# Patient Record
Sex: Male | Born: 1935 | Race: White | Hispanic: No | Marital: Married | Smoking: Former smoker
Health system: Southern US, Community
[De-identification: ages and names within clinical notes are randomized; demographics above are authoritative.]

## PROBLEM LIST (undated history)

## (undated) DIAGNOSIS — F32A Depression, unspecified: Secondary | ICD-10-CM

## (undated) DIAGNOSIS — M48061 Spinal stenosis, lumbar region without neurogenic claudication: Secondary | ICD-10-CM

## (undated) DIAGNOSIS — M1990 Unspecified osteoarthritis, unspecified site: Secondary | ICD-10-CM

## (undated) DIAGNOSIS — M109 Gout, unspecified: Secondary | ICD-10-CM

## (undated) DIAGNOSIS — R239 Unspecified skin changes: Secondary | ICD-10-CM

## (undated) DIAGNOSIS — R221 Localized swelling, mass and lump, neck: Secondary | ICD-10-CM

## (undated) DIAGNOSIS — E119 Type 2 diabetes mellitus without complications: Secondary | ICD-10-CM

## (undated) DIAGNOSIS — E039 Hypothyroidism, unspecified: Secondary | ICD-10-CM

## (undated) DIAGNOSIS — E785 Hyperlipidemia, unspecified: Secondary | ICD-10-CM

## (undated) DIAGNOSIS — K5792 Diverticulitis of intestine, part unspecified, without perforation or abscess without bleeding: Secondary | ICD-10-CM

## (undated) DIAGNOSIS — G4733 Obstructive sleep apnea (adult) (pediatric): Secondary | ICD-10-CM

## (undated) DIAGNOSIS — D631 Anemia in chronic kidney disease: Secondary | ICD-10-CM

## (undated) DIAGNOSIS — J189 Pneumonia, unspecified organism: Secondary | ICD-10-CM

## (undated) DIAGNOSIS — I82409 Acute embolism and thrombosis of unspecified deep veins of unspecified lower extremity: Secondary | ICD-10-CM

## (undated) DIAGNOSIS — I272 Pulmonary hypertension, unspecified: Secondary | ICD-10-CM

## (undated) DIAGNOSIS — I259 Chronic ischemic heart disease, unspecified: Secondary | ICD-10-CM

## (undated) DIAGNOSIS — I509 Heart failure, unspecified: Secondary | ICD-10-CM

## (undated) DIAGNOSIS — D696 Thrombocytopenia, unspecified: Secondary | ICD-10-CM

## (undated) DIAGNOSIS — D469 Myelodysplastic syndrome, unspecified: Principal | ICD-10-CM

## (undated) DIAGNOSIS — I442 Atrioventricular block, complete: Secondary | ICD-10-CM

## (undated) DIAGNOSIS — K602 Anal fissure, unspecified: Secondary | ICD-10-CM

## (undated) DIAGNOSIS — N039 Chronic nephritic syndrome with unspecified morphologic changes: Principal | ICD-10-CM

## (undated) DIAGNOSIS — I482 Chronic atrial fibrillation, unspecified: Secondary | ICD-10-CM

## (undated) DIAGNOSIS — F329 Major depressive disorder, single episode, unspecified: Secondary | ICD-10-CM

## (undated) DIAGNOSIS — Z86718 Personal history of other venous thrombosis and embolism: Secondary | ICD-10-CM

## (undated) DIAGNOSIS — K219 Gastro-esophageal reflux disease without esophagitis: Secondary | ICD-10-CM

## (undated) DIAGNOSIS — I219 Acute myocardial infarction, unspecified: Secondary | ICD-10-CM

## (undated) HISTORY — DX: Unspecified skin changes: R23.9

## (undated) HISTORY — PX: CATARACT EXTRACTION W/ INTRAOCULAR LENS  IMPLANT, BILATERAL: SHX1307

## (undated) HISTORY — DX: Type 2 diabetes mellitus without complications: E11.9

## (undated) HISTORY — DX: Major depressive disorder, single episode, unspecified: F32.9

## (undated) HISTORY — DX: Anal fissure, unspecified: K60.2

## (undated) HISTORY — PX: VASECTOMY: SHX75

## (undated) HISTORY — DX: Chronic ischemic heart disease, unspecified: I25.9

## (undated) HISTORY — DX: Personal history of other venous thrombosis and embolism: Z86.718

## (undated) HISTORY — DX: Depression, unspecified: F32.A

## (undated) HISTORY — DX: Chronic nephritic syndrome with unspecified morphologic changes: N03.9

## (undated) HISTORY — DX: Chronic atrial fibrillation, unspecified: I48.20

## (undated) HISTORY — DX: Thrombocytopenia, unspecified: D69.6

## (undated) HISTORY — DX: Hyperlipidemia, unspecified: E78.5

## (undated) HISTORY — PX: ACHILLES TENDON REPAIR: SUR1153

## (undated) HISTORY — DX: Unspecified osteoarthritis, unspecified site: M19.90

## (undated) HISTORY — PX: WRIST SURGERY: SHX841

## (undated) HISTORY — DX: Anemia in chronic kidney disease: D63.1

## (undated) HISTORY — PX: OTHER SURGICAL HISTORY: SHX169

## (undated) HISTORY — DX: Diverticulitis of intestine, part unspecified, without perforation or abscess without bleeding: K57.92

## (undated) HISTORY — DX: Myelodysplastic syndrome, unspecified: D46.9

## (undated) HISTORY — DX: Localized swelling, mass and lump, neck: R22.1

## (undated) HISTORY — DX: Pulmonary hypertension, unspecified: I27.20

---
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
## 1958-12-31 HISTORY — PX: THYROIDECTOMY: SHX17

## 1978-12-31 HISTORY — PX: INGUINAL HERNIA REPAIR: SUR1180

## 1989-08-31 DIAGNOSIS — Z86718 Personal history of other venous thrombosis and embolism: Secondary | ICD-10-CM

## 1989-08-31 HISTORY — DX: Personal history of other venous thrombosis and embolism: Z86.718

## 1997-12-31 DIAGNOSIS — I219 Acute myocardial infarction, unspecified: Secondary | ICD-10-CM

## 1997-12-31 HISTORY — DX: Acute myocardial infarction, unspecified: I21.9

## 1997-12-31 HISTORY — PX: CORONARY ANGIOPLASTY WITH STENT PLACEMENT: SHX49

## 1998-05-24 ENCOUNTER — Ambulatory Visit (HOSPITAL_COMMUNITY): Admission: RE | Admit: 1998-05-24 | Discharge: 1998-05-24 | Payer: Self-pay | Admitting: Cardiology

## 1998-05-25 ENCOUNTER — Ambulatory Visit (HOSPITAL_COMMUNITY): Admission: RE | Admit: 1998-05-25 | Discharge: 1998-05-25 | Payer: Self-pay | Admitting: Cardiology

## 1998-10-17 ENCOUNTER — Emergency Department (HOSPITAL_COMMUNITY): Admission: EM | Admit: 1998-10-17 | Discharge: 1998-10-17 | Payer: Self-pay | Admitting: Emergency Medicine

## 2001-04-20 ENCOUNTER — Encounter: Payer: Self-pay | Admitting: Cardiology

## 2001-04-20 ENCOUNTER — Ambulatory Visit (HOSPITAL_COMMUNITY): Admission: RE | Admit: 2001-04-20 | Discharge: 2001-04-20 | Payer: Self-pay | Admitting: Cardiology

## 2001-04-25 ENCOUNTER — Ambulatory Visit (HOSPITAL_COMMUNITY): Admission: RE | Admit: 2001-04-25 | Discharge: 2001-04-25 | Payer: Self-pay | Admitting: Cardiology

## 2001-10-29 ENCOUNTER — Encounter: Payer: Self-pay | Admitting: Emergency Medicine

## 2001-10-29 ENCOUNTER — Emergency Department (HOSPITAL_COMMUNITY): Admission: EM | Admit: 2001-10-29 | Discharge: 2001-10-29 | Payer: Self-pay | Admitting: Emergency Medicine

## 2003-04-13 ENCOUNTER — Encounter: Admission: RE | Admit: 2003-04-13 | Discharge: 2003-04-13 | Payer: Self-pay | Admitting: Cardiology

## 2003-04-13 ENCOUNTER — Encounter: Payer: Self-pay | Admitting: Cardiology

## 2005-04-04 ENCOUNTER — Emergency Department (HOSPITAL_COMMUNITY): Admission: EM | Admit: 2005-04-04 | Discharge: 2005-04-04 | Payer: Self-pay | Admitting: Family Medicine

## 2006-05-30 ENCOUNTER — Encounter: Admission: RE | Admit: 2006-05-30 | Discharge: 2006-05-30 | Payer: Self-pay | Admitting: Orthopaedic Surgery

## 2006-06-05 ENCOUNTER — Ambulatory Visit: Payer: Self-pay | Admitting: Hematology and Oncology

## 2006-06-19 LAB — CBC & DIFF AND RETIC
Eosinophils Absolute: 0.1 10*3/uL (ref 0.0–0.5)
HGB: 10.4 g/dL — ABNORMAL LOW (ref 13.0–17.1)
IRF: 0.35 (ref 0.070–0.380)
MONO#: 0.8 10*3/uL (ref 0.1–0.9)
MONO%: 12 % (ref 0.0–13.0)
NEUT#: 5.2 10*3/uL (ref 1.5–6.5)
RBC: 2.94 10*6/uL — ABNORMAL LOW (ref 4.20–5.71)
RDW: 14.9 % — ABNORMAL HIGH (ref 11.2–14.6)
RETIC #: 45 10*3/uL (ref 31.8–103.9)
Retic %: 1.5 % (ref 0.7–2.3)
WBC: 6.6 10*3/uL (ref 4.0–10.0)
lymph#: 0.6 10*3/uL — ABNORMAL LOW (ref 0.9–3.3)

## 2006-06-19 LAB — TSH: TSH: 0.26 u[IU]/mL — ABNORMAL LOW (ref 0.350–5.500)

## 2006-06-19 LAB — CHCC SMEAR

## 2006-06-20 LAB — COMPREHENSIVE METABOLIC PANEL
BUN: 27 mg/dL — ABNORMAL HIGH (ref 6–23)
CO2: 23 mEq/L (ref 19–32)
Glucose, Bld: 121 mg/dL — ABNORMAL HIGH (ref 70–99)
Sodium: 133 mEq/L — ABNORMAL LOW (ref 135–145)
Total Bilirubin: 0.8 mg/dL (ref 0.3–1.2)
Total Protein: 6 g/dL (ref 6.0–8.3)

## 2006-06-20 LAB — FOLATE: Folate: >20 ng/mL

## 2006-06-20 LAB — DIRECT ANTIGLOBULIN TEST (NOT AT ARMC): DAT (Complement): NEGATIVE

## 2006-06-20 LAB — VITAMIN B12: Vitamin B-12: 376 pg/mL (ref 211–911)

## 2006-06-20 LAB — PROTEIN ELECTROPHORESIS, SERUM
Gamma Globulin: 10.1 % — ABNORMAL LOW (ref 11.1–18.8)
Total Protein, Serum Electrophoresis: 6 g/dL (ref 6.0–8.3)

## 2006-06-20 LAB — IRON AND TIBC: Iron: 95 ug/dL (ref 42–165)

## 2006-06-20 LAB — FERRITIN: Ferritin: 273 ng/mL (ref 22–322)

## 2006-06-26 LAB — UIFE/LIGHT CHAINS/TP QN, 24-HR UR
Free Kappa Lt Chains,Ur: 0.2 mg/dL (ref 0.04–1.51)
Free Kappa/Lambda Ratio: 2 ratio (ref 0.46–4.00)
Free Lambda Excretion/Day: 1.82 mg/d
Time: 24 hours
Total Protein, Urine: 0.5 mg/dL

## 2006-06-28 ENCOUNTER — Other Ambulatory Visit: Admission: RE | Admit: 2006-06-28 | Discharge: 2006-06-28 | Payer: Self-pay | Admitting: Hematology and Oncology

## 2006-06-28 ENCOUNTER — Encounter (INDEPENDENT_AMBULATORY_CARE_PROVIDER_SITE_OTHER): Payer: Self-pay | Admitting: Specialist

## 2006-07-12 LAB — CBC WITH DIFFERENTIAL/PLATELET
Basophils Absolute: 0 10*3/uL (ref 0.0–0.1)
Eosinophils Absolute: 0.1 10*3/uL (ref 0.0–0.5)
HGB: 10.6 g/dL — ABNORMAL LOW (ref 13.0–17.1)
LYMPH%: 12.8 % — ABNORMAL LOW (ref 14.0–48.0)
MCV: 104.5 fL — ABNORMAL HIGH (ref 81.6–98.0)
MONO#: 0.7 10*3/uL (ref 0.1–0.9)
NEUT#: 4.1 10*3/uL (ref 1.5–6.5)
Platelets: 207 10*3/uL (ref 145–400)
RBC: 2.95 10*6/uL — ABNORMAL LOW (ref 4.20–5.71)
RDW: 14.9 % — ABNORMAL HIGH (ref 11.2–14.6)
WBC: 5.7 10*3/uL (ref 4.0–10.0)

## 2006-07-12 LAB — COMPREHENSIVE METABOLIC PANEL
Albumin: 4.2 g/dL (ref 3.5–5.2)
BUN: 18 mg/dL (ref 6–23)
CO2: 28 mEq/L (ref 19–32)
Glucose, Bld: 82 mg/dL (ref 70–99)
Potassium: 4 mEq/L (ref 3.5–5.3)
Sodium: 137 mEq/L (ref 135–145)
Total Protein: 5.9 g/dL — ABNORMAL LOW (ref 6.0–8.3)

## 2006-07-17 ENCOUNTER — Encounter: Admission: RE | Admit: 2006-07-17 | Discharge: 2006-07-17 | Payer: Self-pay | Admitting: Orthopaedic Surgery

## 2006-08-16 ENCOUNTER — Encounter: Admission: RE | Admit: 2006-08-16 | Discharge: 2006-08-16 | Payer: Self-pay | Admitting: Orthopaedic Surgery

## 2007-03-31 ENCOUNTER — Encounter: Admission: RE | Admit: 2007-03-31 | Discharge: 2007-03-31 | Payer: Self-pay | Admitting: Otolaryngology

## 2008-01-01 HISTORY — PX: COLONOSCOPY: SHX174

## 2008-10-22 ENCOUNTER — Emergency Department (HOSPITAL_COMMUNITY): Admission: EM | Admit: 2008-10-22 | Discharge: 2008-10-22 | Payer: Self-pay | Admitting: Family Medicine

## 2008-12-06 ENCOUNTER — Ambulatory Visit: Payer: Self-pay | Admitting: Vascular Surgery

## 2010-08-08 ENCOUNTER — Ambulatory Visit: Payer: Self-pay | Admitting: *Deleted

## 2010-09-06 ENCOUNTER — Ambulatory Visit: Payer: Self-pay | Admitting: Cardiology

## 2010-10-04 ENCOUNTER — Ambulatory Visit: Payer: Self-pay | Admitting: Cardiology

## 2010-11-01 ENCOUNTER — Ambulatory Visit: Payer: Self-pay | Admitting: Cardiology

## 2010-11-25 ENCOUNTER — Emergency Department (HOSPITAL_COMMUNITY): Admission: EM | Admit: 2010-11-25 | Discharge: 2010-11-25 | Payer: Self-pay | Admitting: Emergency Medicine

## 2010-11-29 ENCOUNTER — Ambulatory Visit: Payer: Self-pay | Admitting: Cardiology

## 2010-12-01 ENCOUNTER — Ambulatory Visit: Payer: Self-pay | Admitting: Cardiology

## 2010-12-05 ENCOUNTER — Encounter: Payer: Self-pay | Admitting: Cardiology

## 2010-12-05 ENCOUNTER — Ambulatory Visit (HOSPITAL_COMMUNITY)
Admission: RE | Admit: 2010-12-05 | Discharge: 2010-12-05 | Payer: Self-pay | Source: Home / Self Care | Admitting: Cardiology

## 2010-12-05 ENCOUNTER — Ambulatory Visit: Payer: Self-pay

## 2010-12-06 ENCOUNTER — Ambulatory Visit: Payer: Self-pay | Admitting: Cardiovascular Disease

## 2010-12-28 ENCOUNTER — Ambulatory Visit: Payer: Self-pay | Admitting: Cardiology

## 2011-01-21 ENCOUNTER — Encounter: Payer: Self-pay | Admitting: Otolaryngology

## 2011-01-21 ENCOUNTER — Encounter: Payer: Self-pay | Admitting: Orthopaedic Surgery

## 2011-01-22 ENCOUNTER — Ambulatory Visit: Payer: Self-pay | Admitting: Cardiology

## 2011-02-16 ENCOUNTER — Other Ambulatory Visit (INDEPENDENT_AMBULATORY_CARE_PROVIDER_SITE_OTHER): Payer: Medicare Other

## 2011-02-16 DIAGNOSIS — I4891 Unspecified atrial fibrillation: Secondary | ICD-10-CM

## 2011-02-16 DIAGNOSIS — Z7901 Long term (current) use of anticoagulants: Secondary | ICD-10-CM

## 2011-03-06 ENCOUNTER — Encounter (INDEPENDENT_AMBULATORY_CARE_PROVIDER_SITE_OTHER): Payer: Medicare Other

## 2011-03-06 DIAGNOSIS — Z7901 Long term (current) use of anticoagulants: Secondary | ICD-10-CM

## 2011-03-06 DIAGNOSIS — I4891 Unspecified atrial fibrillation: Secondary | ICD-10-CM

## 2011-03-22 ENCOUNTER — Other Ambulatory Visit: Payer: Self-pay | Admitting: Dermatology

## 2011-03-29 ENCOUNTER — Telehealth: Payer: Self-pay | Admitting: Cardiology

## 2011-03-29 NOTE — Telephone Encounter (Signed)
RN scheduled f/u INR with pt's wife for 04/03/11.

## 2011-03-29 NOTE — Telephone Encounter (Signed)
Does patient need an appointment for INR check sooner than 04/17/2011.  They did not get results.

## 2011-04-03 ENCOUNTER — Other Ambulatory Visit: Payer: Medicare Other | Admitting: *Deleted

## 2011-04-03 ENCOUNTER — Ambulatory Visit (INDEPENDENT_AMBULATORY_CARE_PROVIDER_SITE_OTHER): Payer: Medicare Other | Admitting: *Deleted

## 2011-04-03 DIAGNOSIS — I4891 Unspecified atrial fibrillation: Secondary | ICD-10-CM

## 2011-04-03 DIAGNOSIS — Z7901 Long term (current) use of anticoagulants: Secondary | ICD-10-CM

## 2011-04-03 LAB — POCT INR: INR: 1.8

## 2011-04-16 ENCOUNTER — Encounter: Payer: Self-pay | Admitting: Cardiology

## 2011-04-16 DIAGNOSIS — E785 Hyperlipidemia, unspecified: Secondary | ICD-10-CM | POA: Insufficient documentation

## 2011-04-16 DIAGNOSIS — E119 Type 2 diabetes mellitus without complications: Secondary | ICD-10-CM | POA: Insufficient documentation

## 2011-04-16 DIAGNOSIS — I482 Chronic atrial fibrillation, unspecified: Secondary | ICD-10-CM | POA: Insufficient documentation

## 2011-04-16 DIAGNOSIS — I272 Pulmonary hypertension, unspecified: Secondary | ICD-10-CM | POA: Insufficient documentation

## 2011-04-16 DIAGNOSIS — I517 Cardiomegaly: Secondary | ICD-10-CM | POA: Insufficient documentation

## 2011-04-16 DIAGNOSIS — R262 Difficulty in walking, not elsewhere classified: Secondary | ICD-10-CM | POA: Insufficient documentation

## 2011-04-16 DIAGNOSIS — I499 Cardiac arrhythmia, unspecified: Secondary | ICD-10-CM | POA: Insufficient documentation

## 2011-04-16 DIAGNOSIS — H9190 Unspecified hearing loss, unspecified ear: Secondary | ICD-10-CM | POA: Insufficient documentation

## 2011-04-16 DIAGNOSIS — R0602 Shortness of breath: Secondary | ICD-10-CM | POA: Insufficient documentation

## 2011-04-16 DIAGNOSIS — R609 Edema, unspecified: Secondary | ICD-10-CM | POA: Insufficient documentation

## 2011-04-16 DIAGNOSIS — I259 Chronic ischemic heart disease, unspecified: Secondary | ICD-10-CM | POA: Insufficient documentation

## 2011-04-16 DIAGNOSIS — R239 Unspecified skin changes: Secondary | ICD-10-CM | POA: Insufficient documentation

## 2011-04-16 DIAGNOSIS — F329 Major depressive disorder, single episode, unspecified: Secondary | ICD-10-CM | POA: Insufficient documentation

## 2011-04-16 DIAGNOSIS — F1010 Alcohol abuse, uncomplicated: Secondary | ICD-10-CM | POA: Insufficient documentation

## 2011-04-17 ENCOUNTER — Encounter: Payer: Self-pay | Admitting: Cardiology

## 2011-04-17 ENCOUNTER — Ambulatory Visit (INDEPENDENT_AMBULATORY_CARE_PROVIDER_SITE_OTHER): Payer: Medicare Other | Admitting: Cardiology

## 2011-04-17 ENCOUNTER — Ambulatory Visit (INDEPENDENT_AMBULATORY_CARE_PROVIDER_SITE_OTHER): Payer: Medicare Other | Admitting: *Deleted

## 2011-04-17 DIAGNOSIS — I272 Pulmonary hypertension, unspecified: Secondary | ICD-10-CM

## 2011-04-17 DIAGNOSIS — I2789 Other specified pulmonary heart diseases: Secondary | ICD-10-CM

## 2011-04-17 DIAGNOSIS — I4891 Unspecified atrial fibrillation: Secondary | ICD-10-CM

## 2011-04-17 DIAGNOSIS — E785 Hyperlipidemia, unspecified: Secondary | ICD-10-CM

## 2011-04-17 NOTE — Assessment & Plan Note (Signed)
He stopped Crestor because of myalgias. I've encouraged him to start back on Crestor. Myalgias went away as he lost fluid. If myalgias return with Crestor, we'll need to discontinue that permanently.

## 2011-04-17 NOTE — Assessment & Plan Note (Signed)
Mr. Paglia is able to manage his lower extremity edema with combination of support stockings and p.r.n. Diuretics. I encouraged him to weigh on a daily basis. His chronic dyspnea on exertion is unchanged. If he holds onto too much fluid, he begins to get a cough.

## 2011-04-17 NOTE — Assessment & Plan Note (Signed)
He remains in atrial fibrillation with rate control. His INR today is 2.1. Check an INR in 4 weeks and have him see Dr. Antoine Poche in 4 months.

## 2011-04-17 NOTE — Progress Notes (Signed)
Subjective:   Douglas George seen today for followup visit. He has chronic atrial fibrillation is managed on chronic Coumadin. He's had atrial fibrillation since 1989. He's having more lower extremity edema with a consideration of pulmonary hypertension with PAS of up to 74 mmHg by echocardiogram in December of 2011. He's been managed on diuretics and with increasing shortness of breath increasing lower extremity edema, weight has been able to be controlled by increasing the diuretic.  He's had problems with hyperlipemia with intolerance to statin drugs but currently is off Crestor but is willing to restart. He's had moderate coronary atherosclerosis in the past with a last catheterization in 1998 which led to a stent in the right coronary artery. At that time, his only residual disease was a 60-70% narrowing in the LAD. He's had normal left ventricular ejection fraction.  Current Outpatient Prescriptions  Medication Sig Dispense Refill  . ACCU-CHEK AVIVA PLUS test strip Use as directed       . Ascorbic Acid (VITAMIN C) 1000 MG tablet Take 1,000 mg by mouth daily.        . Calcium Carb-Cholecalciferol (CALCIUM 1000 + D) 1000-800 MG-UNIT TABS Take by mouth daily.        . Cholecalciferol (VITAMIN D) 1000 UNITS capsule Take 1,000 Units by mouth daily.        Marland Kitchen doxazosin (CARDURA) 4 MG tablet Take 8 mg by mouth at bedtime.        . furosemide (LASIX) 40 MG tablet Take 80 mg by mouth daily.       Marland Kitchen glipiZIDE (GLUCOTROL) 2.5 MG 24 hr tablet Take 2.5 mg by mouth daily.        . IRON PO Take 65 mg by mouth daily.        Marland Kitchen levothyroxine (SYNTHROID, LEVOTHROID) 150 MCG tablet Take 150 mcg by mouth daily.        . metoprolol tartrate (LOPRESSOR) 25 MG tablet Take 12.5 mg by mouth 2 (two) times daily.        Marland Kitchen oxybutynin (DITROPAN) 5 MG tablet Take 5 mg by mouth as needed.        . ramipril (ALTACE) 10 MG capsule Take 10 mg by mouth daily.        . vitamin B-12 (CYANOCOBALAMIN) 500 MCG tablet Take 500 mcg by mouth  daily.        Marland Kitchen warfarin (COUMADIN) 5 MG tablet Take 5 mg by mouth daily. AS DIRECTED       . Metolazone (ZAROXOLYN PO) Take by mouth as needed.        . rosuvastatin (CRESTOR) 5 MG tablet Take 5 mg by mouth daily.          Allergies  Allergen Reactions  . Codeine     Patient Active Problem List  Diagnoses  . Atrial fibrillation  . Edema  . Irregular heart beat  . Hearing loss  . SOB (shortness of breath)  . Difficulty walking  . Skin change  . Chronic atrial fibrillation  . Alcohol abuse  . Diabetes mellitus  . Hyperlipidemia  . Depression  . Ischemic heart disease  . Enlarged RV (right ventricle)  . Pulmonary hypertension    History  Smoking status  . Former Smoker  . Quit date: 12/31/1952  Smokeless tobacco  . Never Used    History  Alcohol Use No    Family History  Problem Relation Age of Onset  . Heart failure Mother 69  . Diabetes Mother 40  .  Heart attack Father 51  . Stroke Father 65  . Aortic aneurysm Father 61    ABDOMINAL    Review of Systems:   The patient denies any heat or cold intolerance.  No weight gain or weight loss.  The patient denies headaches or blurry vision.  There is no cough or sputum production.  The patient denies dizziness.  There is no hematuria or hematochezia.  The patient denies any muscle aches or arthritis.  The patient denies any rash.  The patient denies frequent falling or instability.  There is no history of depression or anxiety.  All other systems were reviewed and are negative.   Physical Exam:   Weights 168. Blood pressure is 142/64. Heart rate 62 he is fragile.Marland Kitchen His a skin biopsy site in the middle of his for head. He has multiple ecchymoses on his skin. He is fragile with a slight tremor.The head is normocephalic and atraumatic.  Pupils are equally round and reactive to light.  Sclerae nonicteric.  Conjunctiva is clear.  Oropharynx is unremarkable.  There's adequate oral airway.  Neck is supple there are no masses.   Thyroid is not enlarged.  There is no lymphadenopathy.  Lungs are clear.  Chest is symmetric.  Heart shows a irregular rate and rhythm.  S1 and S2 are normal.  There is an apical murmur  Abdomen is soft normal bowel sounds.  There is no organomegaly.  Genital and rectal deferred.  Extremities are without edema.support stockings are applied  Peripheral pulses are adequate.  Neurologically intact.  Full range of motion.  The patient is not depressed.  Skin is warm and dry. Assessment / Plan:

## 2011-05-09 ENCOUNTER — Other Ambulatory Visit: Payer: Self-pay | Admitting: Dermatology

## 2011-05-14 ENCOUNTER — Encounter: Payer: Medicare Other | Admitting: *Deleted

## 2011-05-15 ENCOUNTER — Ambulatory Visit (INDEPENDENT_AMBULATORY_CARE_PROVIDER_SITE_OTHER): Payer: Medicare Other | Admitting: *Deleted

## 2011-05-15 DIAGNOSIS — I4891 Unspecified atrial fibrillation: Secondary | ICD-10-CM

## 2011-05-15 NOTE — Procedures (Signed)
DUPLEX DEEP VENOUS EXAM - LOWER EXTREMITY   INDICATION:  Left lower extremity pain and swelling.   HISTORY:  Edema:  Yes.  Trauma/Surgery:  No.  Pain:  Yes.  PE:  No.  Previous DVT:  No.  Anticoagulants:  Warfarin.  Other:   DUPLEX EXAM:                CFV   SFV   PopV  PTV    GSV                R  L  R  L  R  L  R   L  R  L  Thrombosis    o  o     o     o      o     o  Spontaneous   +  +     +     +      +     +  Phasic        +  +     +     +      +     +  Augmentation  +  +     +     +      +     +  Compressible  +  +     +     +      +     +  Competent     +  +     +     +      +     +   Legend:  + - yes  o - no  p - partial  D - decreased   IMPRESSION:  No evidence of deep venous thrombosis noted in the left  leg.   Notified Kelly with results.    _____________________________  Janetta Hora Fields, MD   MG/MEDQ  D:  12/06/2008  T:  12/07/2008  Job:  [id number]

## 2011-05-17 ENCOUNTER — Encounter: Payer: Medicare Other | Admitting: *Deleted

## 2011-05-18 ENCOUNTER — Encounter: Payer: Medicare Other | Admitting: *Deleted

## 2011-05-25 ENCOUNTER — Other Ambulatory Visit: Payer: Self-pay | Admitting: Cardiology

## 2011-05-25 MED ORDER — METOPROLOL TARTRATE 25 MG PO TABS: ORAL_TABLET | ORAL | Status: DC

## 2011-05-25 NOTE — Telephone Encounter (Signed)
Refill of Lopressor done

## 2011-05-25 NOTE — Telephone Encounter (Signed)
Is out of metrpolo 25mg  called into [facility] at [address]  Pharmacy stated they faxed request over to [place].

## 2011-06-12 ENCOUNTER — Ambulatory Visit (INDEPENDENT_AMBULATORY_CARE_PROVIDER_SITE_OTHER): Payer: Medicare Other | Admitting: *Deleted

## 2011-06-12 DIAGNOSIS — I4891 Unspecified atrial fibrillation: Secondary | ICD-10-CM

## 2011-07-10 ENCOUNTER — Encounter: Payer: Medicare Other | Admitting: *Deleted

## 2011-07-10 ENCOUNTER — Ambulatory Visit (INDEPENDENT_AMBULATORY_CARE_PROVIDER_SITE_OTHER): Payer: Medicare Other | Admitting: *Deleted

## 2011-07-10 DIAGNOSIS — I4891 Unspecified atrial fibrillation: Secondary | ICD-10-CM

## 2011-07-10 LAB — POCT INR: INR: 2.8

## 2011-08-07 ENCOUNTER — Ambulatory Visit (INDEPENDENT_AMBULATORY_CARE_PROVIDER_SITE_OTHER): Payer: Medicare Other | Admitting: *Deleted

## 2011-08-07 DIAGNOSIS — I4891 Unspecified atrial fibrillation: Secondary | ICD-10-CM

## 2011-09-04 ENCOUNTER — Encounter: Payer: Medicare Other | Admitting: *Deleted

## 2011-09-13 ENCOUNTER — Ambulatory Visit (INDEPENDENT_AMBULATORY_CARE_PROVIDER_SITE_OTHER): Payer: Medicare Other | Admitting: Cardiology

## 2011-09-13 ENCOUNTER — Encounter: Payer: Self-pay | Admitting: Cardiology

## 2011-09-13 ENCOUNTER — Ambulatory Visit (INDEPENDENT_AMBULATORY_CARE_PROVIDER_SITE_OTHER): Payer: Medicare Other | Admitting: *Deleted

## 2011-09-13 DIAGNOSIS — I2789 Other specified pulmonary heart diseases: Secondary | ICD-10-CM

## 2011-09-13 DIAGNOSIS — I259 Chronic ischemic heart disease, unspecified: Secondary | ICD-10-CM

## 2011-09-13 DIAGNOSIS — I4891 Unspecified atrial fibrillation: Secondary | ICD-10-CM

## 2011-09-13 DIAGNOSIS — I272 Pulmonary hypertension, unspecified: Secondary | ICD-10-CM

## 2011-09-13 DIAGNOSIS — I517 Cardiomegaly: Secondary | ICD-10-CM

## 2011-09-13 DIAGNOSIS — R609 Edema, unspecified: Secondary | ICD-10-CM

## 2011-09-13 DIAGNOSIS — I482 Chronic atrial fibrillation, unspecified: Secondary | ICD-10-CM

## 2011-09-13 DIAGNOSIS — R0989 Other specified symptoms and signs involving the circulatory and respiratory systems: Secondary | ICD-10-CM

## 2011-09-13 NOTE — Assessment & Plan Note (Signed)
I will check a carotid doppler

## 2011-09-13 NOTE — Assessment & Plan Note (Addendum)
I reviewed extensively his large file.  He tolerates this rhythm and rate control and anticoagulation. We will continue with the meds as listed.  (Greater than 40 minutes reviewing all data with greater than 50% face to face with the patient).

## 2011-09-13 NOTE — Progress Notes (Signed)
HPI The patient presents for follow of atrial fib and CAD.  He was previously seen by Dr. Deborah Chalk.  Since last being seen he has had no new problems.  Diagnoses atrial fibrillation.  He denies any palpitations, presyncope or syncope. He does not have chest pressure, neck or arm discomfort.  He is limited by disc disease.  He gets around with a walker.  He does not report shortness of breath, PND or orthopnea. He said no weight gain. He has chronic lower extremity swelling which he has treated for years with compression stockings.  Allergies  Allergen Reactions  . Codeine     Current Outpatient Prescriptions  Medication Sig Dispense Refill  . ACCU-CHEK AVIVA PLUS test strip Use as directed       . Ascorbic Acid (VITAMIN C) 1000 MG tablet Take 1,000 mg by mouth daily.        . Calcium Carb-Cholecalciferol (CALCIUM 1000 + D) 1000-800 MG-UNIT TABS Take by mouth daily.        . Cholecalciferol (VITAMIN D) 1000 UNITS capsule Take 1,000 Units by mouth daily.        Marland Kitchen doxazosin (CARDURA) 4 MG tablet Take 8 mg by mouth at bedtime.        . furosemide (LASIX) 40 MG tablet Take 80 mg by mouth daily.       Marland Kitchen glipiZIDE (GLUCOTROL) 2.5 MG 24 hr tablet Take 2.5 mg by mouth daily.        . IRON PO Take 65 mg by mouth daily.        Marland Kitchen levothyroxine (SYNTHROID, LEVOTHROID) 150 MCG tablet Take 150 mcg by mouth daily.        . metoprolol tartrate (LOPRESSOR) 25 MG tablet Take 1/2 tablet BID  30 tablet  5  . omeprazole (PRILOSEC) 20 MG capsule Take 20 mg by mouth daily.        Marland Kitchen oxybutynin (DITROPAN) 5 MG tablet Take 5 mg by mouth as needed.        . ramipril (ALTACE) 10 MG capsule Take 10 mg by mouth daily.        . rosuvastatin (CRESTOR) 5 MG tablet Take 5 mg by mouth daily.        . vitamin B-12 (CYANOCOBALAMIN) 500 MCG tablet Take 500 mcg by mouth daily.        Marland Kitchen warfarin (COUMADIN) 5 MG tablet Take 5 mg by mouth daily. AS DIRECTED         Past Medical History  Diagnosis Date  . Edema   . Irregular  heart beat   . Hearing loss   . SOB (shortness of breath)     WITH WALKING  . Difficulty walking   . Skin change   . Chronic atrial fibrillation   . Alcohol abuse     PAST HISTORY  . Diabetes mellitus   . Hyperlipidemia   . Depression   . Ischemic heart disease   . Enlarged RV (right ventricle)   . Pulmonary hypertension     Past Surgical History  Procedure Date  . Thyroidectomy   . Left wrist surgery   . Achilles tendon repair     Left  . Shrapnel     [place]   . Tonsillectomy and adenoidectomy   . Inguinal hernia repair   . Cataract extraction     ROS: PHYSICAL EXAM BP 143/66  Pulse 62  Resp 18  Ht 5\' 6"  (1.676 m)  Wt 165 lb (74.844 kg)  BMI  26.63 kg/m2 GENERAL:  Well appearing HEENT:  Pupils equal round and reactive, fundi not visualized, oral mucosa unremarkable, upper dentures NECK:  No jugular venous distention, waveform within normal limits, carotid upstroke brisk and symmetric, left bruit, no thyromegaly LYMPHATICS:  No cervical, inguinal adenopathy LUNGS:  Clear to auscultation bilaterally BACK:  No CVA tenderness CHEST:  Unremarkable HEART:  PMI not displaced or sustained,S1 and S2 within normal limits, no S3, no clicks, no rubs, holosystolic murmur, irregular ABD:  Flat, positive bowel sounds normal in frequency in pitch, no bruits, no rebound, no guarding, no midline pulsatile mass, no hepatomegaly, no splenomegaly EXT:  2 plus pulses throughout, mild edema, no cyanosis no clubbing SKIN:  No rashes no nodules NEURO:  Cranial nerves II through XII grossly intact, motor grossly intact throughout PSYCH:  Cognitively intact, oriented to person place and time  EKG:  Atrial fibrillation rate 60 axis and intervals within normal limits, no acute ST-T wave changes.  ASSESSMENT AND PLAN

## 2011-09-13 NOTE — Patient Instructions (Signed)
Your physician has requested that you have a carotid duplex. This test is an ultrasound of the carotid arteries in your neck. It looks at blood flow through these arteries that supply the brain with blood. Allow one hour for this exam. There are no restrictions or special instructions.  The current medical regimen is effective;  continue present plan and medications.  Follow up in 6 months with Dr Hochrein.  You will receive a letter in the mail 2 months before you are due.  Please call us when you receive this letter to schedule your follow up appointment.  

## 2011-09-13 NOTE — Assessment & Plan Note (Signed)
He did have an echo in 2011 with pulmonary hypertension, moderate aortic regurgitation and mitral regurgitation. However, this was not significantly changed. He will continue to be managed medically.

## 2011-09-13 NOTE — Assessment & Plan Note (Signed)
We discussed conservative therapy.  No change in medications is indicated.

## 2011-09-13 NOTE — Assessment & Plan Note (Signed)
He has no active angina.   He had a stress perfusion study in 2011 the without significant ischemia. No further evaluation is indicated. He will continue with risk reduction.

## 2011-09-20 ENCOUNTER — Other Ambulatory Visit: Payer: Self-pay | Admitting: Dermatology

## 2011-10-08 ENCOUNTER — Encounter (INDEPENDENT_AMBULATORY_CARE_PROVIDER_SITE_OTHER): Payer: Medicare Other | Admitting: *Deleted

## 2011-10-08 DIAGNOSIS — R0989 Other specified symptoms and signs involving the circulatory and respiratory systems: Secondary | ICD-10-CM

## 2011-10-08 DIAGNOSIS — I6529 Occlusion and stenosis of unspecified carotid artery: Secondary | ICD-10-CM

## 2011-10-11 ENCOUNTER — Ambulatory Visit (INDEPENDENT_AMBULATORY_CARE_PROVIDER_SITE_OTHER): Payer: Medicare Other | Admitting: *Deleted

## 2011-10-11 DIAGNOSIS — I4891 Unspecified atrial fibrillation: Secondary | ICD-10-CM

## 2011-10-11 LAB — POCT INR: INR: 2.9

## 2011-11-08 ENCOUNTER — Ambulatory Visit (INDEPENDENT_AMBULATORY_CARE_PROVIDER_SITE_OTHER): Payer: Medicare Other | Admitting: *Deleted

## 2011-11-08 DIAGNOSIS — I4891 Unspecified atrial fibrillation: Secondary | ICD-10-CM

## 2011-11-08 LAB — POCT INR: INR: 3.2

## 2011-11-19 ENCOUNTER — Other Ambulatory Visit: Payer: Self-pay | Admitting: Dermatology

## 2011-11-27 ENCOUNTER — Other Ambulatory Visit: Payer: Self-pay | Admitting: *Deleted

## 2011-11-27 MED ORDER — WARFARIN SODIUM 5 MG PO TABS: 5.0000 mg | ORAL_TABLET | ORAL | Status: DC

## 2011-11-29 ENCOUNTER — Ambulatory Visit (INDEPENDENT_AMBULATORY_CARE_PROVIDER_SITE_OTHER): Payer: Medicare Other | Admitting: *Deleted

## 2011-11-29 DIAGNOSIS — I4891 Unspecified atrial fibrillation: Secondary | ICD-10-CM

## 2011-12-13 ENCOUNTER — Ambulatory Visit (INDEPENDENT_AMBULATORY_CARE_PROVIDER_SITE_OTHER): Payer: Medicare Other | Admitting: *Deleted

## 2011-12-13 DIAGNOSIS — I4891 Unspecified atrial fibrillation: Secondary | ICD-10-CM

## 2011-12-13 LAB — POCT INR: INR: 2.6

## 2012-01-03 ENCOUNTER — Ambulatory Visit (INDEPENDENT_AMBULATORY_CARE_PROVIDER_SITE_OTHER): Payer: Medicare Other | Admitting: *Deleted

## 2012-01-03 DIAGNOSIS — I4891 Unspecified atrial fibrillation: Secondary | ICD-10-CM

## 2012-01-03 LAB — POCT INR: INR: 2.1

## 2012-01-31 ENCOUNTER — Ambulatory Visit (INDEPENDENT_AMBULATORY_CARE_PROVIDER_SITE_OTHER): Payer: Medicare Other | Admitting: Pharmacist

## 2012-01-31 DIAGNOSIS — I4891 Unspecified atrial fibrillation: Secondary | ICD-10-CM

## 2012-02-27 ENCOUNTER — Ambulatory Visit (INDEPENDENT_AMBULATORY_CARE_PROVIDER_SITE_OTHER): Payer: Medicare Other | Admitting: Pharmacist

## 2012-02-27 ENCOUNTER — Other Ambulatory Visit: Payer: Self-pay | Admitting: Dermatology

## 2012-02-27 DIAGNOSIS — I4891 Unspecified atrial fibrillation: Secondary | ICD-10-CM

## 2012-02-27 NOTE — Patient Instructions (Signed)
Counseled patient to let us know if any medications change or if new ones are added, or if patient experiences bleeding or bruising.

## 2012-03-17 ENCOUNTER — Encounter: Payer: Self-pay | Admitting: Cardiology

## 2012-03-17 ENCOUNTER — Ambulatory Visit (INDEPENDENT_AMBULATORY_CARE_PROVIDER_SITE_OTHER): Payer: Medicare Other | Admitting: Cardiology

## 2012-03-17 VITALS — BP 126/64 | HR 45 | Ht 66.0 in | Wt 161.0 lb

## 2012-03-17 DIAGNOSIS — I4891 Unspecified atrial fibrillation: Secondary | ICD-10-CM

## 2012-03-17 DIAGNOSIS — R609 Edema, unspecified: Secondary | ICD-10-CM

## 2012-03-17 DIAGNOSIS — I517 Cardiomegaly: Secondary | ICD-10-CM

## 2012-03-17 DIAGNOSIS — I259 Chronic ischemic heart disease, unspecified: Secondary | ICD-10-CM

## 2012-03-17 NOTE — Assessment & Plan Note (Signed)
He has no active angina.   He had a stress perfusion study in 2011 the without significant ischemia. No further evaluation is indicated. He will continue with risk reduction. 

## 2012-03-17 NOTE — Assessment & Plan Note (Signed)
He did have an echo in 2011 with pulmonary hypertension, moderate aortic regurgitation and mitral regurgitation. However, this was not significantly changed. He will continue to be managed medically. 

## 2012-03-17 NOTE — Patient Instructions (Signed)
Please stop your Metoprolol. Continue all other medications as listed  Follow up with Lawson Fiscal in 4 months

## 2012-03-17 NOTE — Progress Notes (Signed)
HPI The patient presents for follow of atrial fib and CAD.  Since I last saw him he has done well. He gets around with a walker and he reports no recent cardiovascular complaints.  He denies chest pain and has no new SOB.  He does not typically notice palpitations and he has had no presyncope or syncope. I do note his heart rate to be in the forties on EKG today.  Allergies  Allergen Reactions  . Codeine     Current Outpatient Prescriptions  Medication Sig Dispense Refill  . ACCU-CHEK AVIVA PLUS test strip Use as directed       . Ascorbic Acid (VITAMIN C) 1000 MG tablet Take 1,000 mg by mouth daily.        . Calcium Carb-Cholecalciferol (CALCIUM 1000 + D) 1000-800 MG-UNIT TABS Take by mouth daily.        . Cholecalciferol (VITAMIN D) 1000 UNITS capsule Take 1,000 Units by mouth daily.        Marland Kitchen doxazosin (CARDURA) 4 MG tablet Take 4 mg by mouth at bedtime.       . furosemide (LASIX) 40 MG tablet Take 80 mg by mouth daily.       Marland Kitchen glipiZIDE (GLUCOTROL) 2.5 MG 24 hr tablet Take 2.5 mg by mouth daily.        Marland Kitchen glucosamine-chondroitin 500-400 MG tablet Take 1 tablet by mouth daily.      . IRON PO Take 65 mg by mouth daily.        Marland Kitchen levothyroxine (SYNTHROID, LEVOTHROID) 150 MCG tablet Take 150 mcg by mouth daily.        . metoprolol tartrate (LOPRESSOR) 25 MG tablet Take 1/2 tablet BID  30 tablet  5  . omeprazole (PRILOSEC) 20 MG capsule Take 20 mg by mouth daily.        Marland Kitchen oxybutynin (DITROPAN) 5 MG tablet Take 5 mg by mouth as needed.        . ramipril (ALTACE) 10 MG capsule Take 10 mg by mouth daily.        . rosuvastatin (CRESTOR) 5 MG tablet Take 5 mg by mouth daily.       . vitamin B-12 (CYANOCOBALAMIN) 500 MCG tablet Take 500 mcg by mouth daily.        Marland Kitchen warfarin (COUMADIN) 5 MG tablet Take 1 tablet (5 mg total) by mouth as directed. Take as directed  30 tablet  3    Past Medical History  Diagnosis Date  . Edema   . Irregular heart beat   . Hearing loss   . SOB (shortness of  breath)     WITH WALKING  . Difficulty walking   . Skin change   . Chronic atrial fibrillation   . Alcohol abuse     PAST HISTORY  . Diabetes mellitus   . Hyperlipidemia   . Depression   . Ischemic heart disease   . Enlarged RV (right ventricle)   . Pulmonary hypertension     Past Surgical History  Procedure Date  . Thyroidectomy   . Left wrist surgery   . Achilles tendon repair     Left  . Shrapnel     [place]   . Tonsillectomy and adenoidectomy   . Inguinal hernia repair   . Cataract extraction     ROS:  As stated in the HPI and negative for all other systems.  PHYSICAL EXAM BP 126/64  Pulse 45  Ht 5\' 6"  (1.676 m)  Wt  161 lb (73.029 kg)  BMI 25.99 kg/m2 PHYSICAL EXAM GEN:  No distress NECK:  No jugular venous distention at 90 degrees, waveform within normal limits, carotid upstroke brisk and symmetric, no bruits, no thyromegaly LUNGS:  Clear to auscultation bilaterally BACK:  No CVA tenderness CHEST:  Unremarkable HEART:  S1 and S2 within normal limits, no S3,  no clicks, no rubs, right lower sternal border holosystolic murmur ABD:  Positive bowel sounds normal in frequency in pitch, no bruits, no rebound, no guarding, unable to assess midline mass or bruit with the patient seated. EXT:  2 plus pulses throughout, moderate edema,  NEURO:  Cranial nerves II through XII grossly intact, motor grossly intact throughout PSYCH:  Cognitively intact, oriented to person place and time  EKG:  Atrial fibrillation rate 45 axis and intervals within normal limits, no acute ST-T wave changes. 03/17/2012  ASSESSMENT AND PLAN

## 2012-03-17 NOTE — Assessment & Plan Note (Signed)
His rate is running low.  I will stop his beta blocker.  He will let me know if he has any increasing tachypalpitations. Otherwise he will continue meds as listed.

## 2012-03-17 NOTE — Assessment & Plan Note (Signed)
We discussed conservative therapy.  No change in medications is indicated. I gave him for shortness for the stocking store in [place]

## 2012-04-09 ENCOUNTER — Ambulatory Visit (INDEPENDENT_AMBULATORY_CARE_PROVIDER_SITE_OTHER): Payer: Medicare Other | Admitting: Pharmacist

## 2012-04-09 DIAGNOSIS — I4891 Unspecified atrial fibrillation: Secondary | ICD-10-CM

## 2012-04-09 LAB — POCT INR: INR: 1.8

## 2012-05-21 ENCOUNTER — Ambulatory Visit (INDEPENDENT_AMBULATORY_CARE_PROVIDER_SITE_OTHER): Payer: Medicare Other | Admitting: Pharmacist

## 2012-05-21 DIAGNOSIS — I4891 Unspecified atrial fibrillation: Secondary | ICD-10-CM

## 2012-05-21 MED ORDER — WARFARIN SODIUM 2.5 MG PO TABS: ORAL_TABLET | ORAL | Status: DC

## 2012-06-04 ENCOUNTER — Ambulatory Visit (INDEPENDENT_AMBULATORY_CARE_PROVIDER_SITE_OTHER): Payer: Medicare Other | Admitting: *Deleted

## 2012-06-04 DIAGNOSIS — I4891 Unspecified atrial fibrillation: Secondary | ICD-10-CM

## 2012-06-18 ENCOUNTER — Ambulatory Visit (INDEPENDENT_AMBULATORY_CARE_PROVIDER_SITE_OTHER): Payer: Medicare Other | Admitting: *Deleted

## 2012-06-18 DIAGNOSIS — I4891 Unspecified atrial fibrillation: Secondary | ICD-10-CM

## 2012-06-18 LAB — POCT INR: INR: 2

## 2012-07-05 ENCOUNTER — Telehealth: Payer: Self-pay | Admitting: Nurse Practitioner

## 2012-07-05 ENCOUNTER — Encounter (HOSPITAL_COMMUNITY): Payer: Self-pay

## 2012-07-05 ENCOUNTER — Emergency Department (HOSPITAL_COMMUNITY)
Admission: EM | Admit: 2012-07-05 | Discharge: 2012-07-05 | Disposition: A | Discharge status: Home / Self Care | Payer: Medicare Other | Attending: Emergency Medicine | Admitting: Emergency Medicine

## 2012-07-05 ENCOUNTER — Emergency Department (HOSPITAL_COMMUNITY): Payer: Medicare Other

## 2012-07-05 DIAGNOSIS — E785 Hyperlipidemia, unspecified: Secondary | ICD-10-CM | POA: Insufficient documentation

## 2012-07-05 DIAGNOSIS — Z7901 Long term (current) use of anticoagulants: Secondary | ICD-10-CM | POA: Insufficient documentation

## 2012-07-05 DIAGNOSIS — S0990XA Unspecified injury of head, initial encounter: Secondary | ICD-10-CM | POA: Insufficient documentation

## 2012-07-05 DIAGNOSIS — I4891 Unspecified atrial fibrillation: Secondary | ICD-10-CM | POA: Insufficient documentation

## 2012-07-05 DIAGNOSIS — L0390 Cellulitis, unspecified: Secondary | ICD-10-CM

## 2012-07-05 DIAGNOSIS — Z79899 Other long term (current) drug therapy: Secondary | ICD-10-CM | POA: Insufficient documentation

## 2012-07-05 DIAGNOSIS — L02419 Cutaneous abscess of limb, unspecified: Secondary | ICD-10-CM | POA: Insufficient documentation

## 2012-07-05 DIAGNOSIS — F329 Major depressive disorder, single episode, unspecified: Secondary | ICD-10-CM | POA: Insufficient documentation

## 2012-07-05 DIAGNOSIS — W010XXA Fall on same level from slipping, tripping and stumbling without subsequent striking against object, initial encounter: Secondary | ICD-10-CM | POA: Insufficient documentation

## 2012-07-05 DIAGNOSIS — E119 Type 2 diabetes mellitus without complications: Secondary | ICD-10-CM | POA: Insufficient documentation

## 2012-07-05 DIAGNOSIS — F3289 Other specified depressive episodes: Secondary | ICD-10-CM | POA: Insufficient documentation

## 2012-07-05 DIAGNOSIS — L03119 Cellulitis of unspecified part of limb: Secondary | ICD-10-CM | POA: Insufficient documentation

## 2012-07-05 DIAGNOSIS — IMO0002 Reserved for concepts with insufficient information to code with codable children: Secondary | ICD-10-CM | POA: Insufficient documentation

## 2012-07-05 MED ORDER — BACITRACIN ZINC 500 UNIT/GM EX OINT
TOPICAL_OINTMENT | CUTANEOUS | Status: AC
  Administered 2012-07-05: 2
  Filled 2012-07-05: qty 1.8

## 2012-07-05 MED ORDER — TETANUS-DIPHTHERIA TOXOIDS TD 5-2 LFU IM INJ
0.5000 mL | INJECTION | Freq: Once | INTRAMUSCULAR | Status: AC
  Administered 2012-07-05: 0.5 mL via INTRAMUSCULAR
  Filled 2012-07-05: qty 0.5

## 2012-07-05 MED ORDER — CLINDAMYCIN HCL 300 MG PO CAPS: 300.0000 mg | ORAL_CAPSULE | Freq: Three times a day (TID) | ORAL | Status: AC

## 2012-07-05 NOTE — ED Notes (Signed)
When pt returned from CT, skin tear on forehead had begun to bleed again and soak through bandage.  Forehead cleaned again and new dressing applied.

## 2012-07-05 NOTE — ED Provider Notes (Signed)
History     CSN: [id number]  Arrival date & time 07/05/12  1047   First MD Initiated Contact with Patient 07/05/12 1117      Chief Complaint  Patient presents with  . Fall  . Head Laceration    (Consider location/radiation/quality/duration/timing/severity/associated sxs/prior treatment) Patient is a 78 y.o. male presenting with fall and scalp laceration. The history is provided by the patient.  Fall  Head Laceration   patient here after falling and striking his head when he tripped on his walker. No loss of consciousness. Abrasion to his right for hip. Denies any neck pain, chest or rib pain. No abdominal pain. No hip pain. Patient does normally use a walker. Notes abrasion to his right elbow which he treated at home.  He also complains of redness around a prior skin biopsy site. Skin biopsy was done to begin half ago and now has increased erythema around the puncture site. No fever associated with this. Has been using topical antibiotics without relief  Past Medical History  Diagnosis Date  . Edema   . Irregular heart beat   . Hearing loss   . SOB (shortness of breath)     WITH WALKING  . Difficulty walking   . Skin change   . Chronic atrial fibrillation   . Alcohol abuse     PAST HISTORY  . Diabetes mellitus   . Hyperlipidemia   . Depression   . Ischemic heart disease   . Enlarged RV (right ventricle)   . Pulmonary hypertension     Past Surgical History  Procedure Date  . Thyroidectomy   . Left wrist surgery   . Achilles tendon repair     Left  . Shrapnel     [place]   . Tonsillectomy and adenoidectomy   . Inguinal hernia repair   . Cataract extraction     Family History  Problem Relation Age of Onset  . Heart failure Mother 14  . Diabetes Mother 49  . Heart attack Father 42  . Stroke Father 40  . Aortic aneurysm Father 43    ABDOMINAL    History  Substance Use Topics  . Smoking status: Former Smoker    Quit date: 12/31/1952  . Smokeless tobacco:  Never Used  . Alcohol Use: No      Review of Systems  All other systems reviewed and are negative.    Allergies  Codeine  Home Medications   Current Outpatient Rx  Name Route Sig Dispense Refill  . VITAMIN C 1000 MG PO TABS Oral Take 1,000 mg by mouth daily.     Marland Kitchen CALCIUM CARB-CHOLECALCIFEROL 1000-800 MG-UNIT PO TABS Oral Take 1 tablet by mouth daily.     Marland Kitchen VITAMIN D 1000 UNITS PO CAPS Oral Take 1,000 Units by mouth daily.     Marland Kitchen DOXAZOSIN MESYLATE 4 MG PO TABS Oral Take 4 mg by mouth daily.     . FUROSEMIDE 40 MG PO TABS Oral Take 40 mg by mouth daily.     Marland Kitchen GLIPIZIDE ER 2.5 MG PO TB24 Oral Take 2.5 mg by mouth daily.     Marland Kitchen GLUCOSAMINE-CHONDROITIN 500-400 MG PO TABS Oral Take 1 tablet by mouth daily.    Marland Kitchen LEVOTHYROXINE SODIUM 150 MCG PO TABS Oral Take 150 mcg by mouth daily.     Marland Kitchen OMEPRAZOLE 20 MG PO CPDR Oral Take 20 mg by mouth daily.     . OXYBUTYNIN CHLORIDE 5 MG PO TABS Oral Take 5 mg by  mouth as needed.      Marland Kitchen RAMIPRIL 10 MG PO CAPS Oral Take 10 mg by mouth daily.      Marland Kitchen ROSUVASTATIN CALCIUM 5 MG PO TABS Oral Take 5 mg by mouth daily.     Marland Kitchen VITAMIN B-12 500 MCG PO TABS Oral Take 500 mcg by mouth daily.      . WARFARIN SODIUM 2.5 MG PO TABS Oral Take 2.5-5 mg by mouth See admin instructions. Pt takes one tablet of 2.5 mg Monday, Tuesday, Wednesday, Thursday, Saturday and Sunday. He takes two tablets on Friday = 5 mg    . ACCU-CHEK AVIVA PLUS VI STRP  Use as directed       BP 118/38  Pulse 69  Temp 98.2 F (36.8 C) (Oral)  Resp 18  SpO2 97%  Physical Exam  Nursing note and vitals reviewed. Constitutional: He is oriented to person, place, and time. He appears well-developed and well-nourished.  Non-toxic appearance. No distress.  HENT:  Head: Normocephalic. Head is with abrasion.    Eyes: Conjunctivae, EOM and lids are normal. Pupils are equal, round, and reactive to light.  Neck: Normal range of motion. Neck supple. No tracheal deviation present. No mass present.   Cardiovascular: Normal rate, regular rhythm and normal heart sounds.  Exam reveals no gallop.   No murmur heard. Pulmonary/Chest: Effort normal and breath sounds normal. No stridor. No respiratory distress. He has no decreased breath sounds. He has no wheezes. He has no rhonchi. He has no rales.  Abdominal: Soft. Normal appearance and bowel sounds are normal. He exhibits no distension. There is no tenderness. There is no rebound and no CVA tenderness.  Musculoskeletal: Normal range of motion. He exhibits no edema and no tenderness.       Abrasion noted to right elbow, right le with erythema surround punch biposy site  Neurological: He is alert and oriented to person, place, and time. He has normal strength. No cranial nerve deficit or sensory deficit. GCS eye subscore is 4. GCS verbal subscore is 5. GCS motor subscore is 6.  Skin: Skin is warm and dry. No abrasion and no rash noted.  Psychiatric: He has a normal mood and affect. His speech is normal and behavior is normal.    ED Course  Procedures (including critical care time)  Labs Reviewed - No data to display No results found.   No diagnosis found.    MDM  Patient's tetanus status was updated. His wounds were clean and dressed by nursing. Head CT was negative. Will treat patient with antibiotics for possible early cellulitis of his right lower extremity        Toy Baker, MD 07/05/12 1247

## 2012-07-05 NOTE — Telephone Encounter (Signed)
Pts wife called back.  He is stable but scalp hematoma is bigger.  She is going to bring him to [facility] ED for eval/head CT.

## 2012-07-05 NOTE — ED Notes (Signed)
Patient transported to CT 

## 2012-07-05 NOTE — ED Notes (Signed)
Daughter states pt has a place to right medial knee where he had a place removed at the dermatologist that she would like looked at as well as it has begun to look infected.  Dime size area noted with redness surrounding it.

## 2012-07-05 NOTE — Telephone Encounter (Signed)
Pts wife called stating that he was out in the yard this AM, lost his balance, fell, and struck his head.  He did not lose consciousness.  He is on coumadin.  He has a quarter-sized scalp hematoma and a small abrasion on his forehead.  She was able to obtain hemostasis with manual pressure.  He denies headache and has no apparent neuro deficits per wife.  I advised that if pt develops any change in his mentation, develops headache, rebleeds, or if hematoma enlarges despite application of ice, he should present to the ED for evaluation and Head CT.  Wife verbalized understanding.

## 2012-07-05 NOTE — ED Notes (Signed)
Clean laceration on patient head and on patient right elbow.

## 2012-07-05 NOTE — ED Notes (Signed)
Pt in from home with laceration(skin tear) to the right side of head and right elbow d/t fall states tripped over walker denies loc states headache denies blurred vision bleeding controlled at present

## 2012-07-11 ENCOUNTER — Ambulatory Visit (INDEPENDENT_AMBULATORY_CARE_PROVIDER_SITE_OTHER): Payer: Medicare Other

## 2012-07-11 DIAGNOSIS — I4891 Unspecified atrial fibrillation: Secondary | ICD-10-CM

## 2012-08-08 ENCOUNTER — Ambulatory Visit (INDEPENDENT_AMBULATORY_CARE_PROVIDER_SITE_OTHER): Payer: Medicare Other | Admitting: *Deleted

## 2012-08-08 DIAGNOSIS — I4891 Unspecified atrial fibrillation: Secondary | ICD-10-CM

## 2012-08-14 ENCOUNTER — Encounter: Payer: Self-pay | Admitting: Nurse Practitioner

## 2012-08-14 ENCOUNTER — Ambulatory Visit (INDEPENDENT_AMBULATORY_CARE_PROVIDER_SITE_OTHER): Payer: Medicare Other | Admitting: Nurse Practitioner

## 2012-08-14 VITALS — BP 144/58 | HR 76 | Ht 66.0 in | Wt 157.0 lb

## 2012-08-14 DIAGNOSIS — R6889 Other general symptoms and signs: Secondary | ICD-10-CM

## 2012-08-14 DIAGNOSIS — I4891 Unspecified atrial fibrillation: Secondary | ICD-10-CM

## 2012-08-14 DIAGNOSIS — R05 Cough: Secondary | ICD-10-CM | POA: Insufficient documentation

## 2012-08-14 DIAGNOSIS — R059 Cough, unspecified: Secondary | ICD-10-CM

## 2012-08-14 DIAGNOSIS — W19XXXA Unspecified fall, initial encounter: Secondary | ICD-10-CM

## 2012-08-14 LAB — CBC WITH DIFFERENTIAL/PLATELET
Basophils Absolute: 0 10*3/uL (ref 0.0–0.1)
Basophils Relative: 0.4 % (ref 0.0–3.0)
Eosinophils Absolute: 0.3 10*3/uL (ref 0.0–0.7)
Eosinophils Relative: 4.5 % (ref 0.0–5.0)
HCT: 29.4 % — ABNORMAL LOW (ref 39.0–52.0)
Hemoglobin: 9.8 g/dL — ABNORMAL LOW (ref 13.0–17.0)
Lymphocytes Relative: 9.1 % — ABNORMAL LOW (ref 12.0–46.0)
Lymphs Abs: 0.6 10*3/uL — ABNORMAL LOW (ref 0.7–4.0)
MCHC: 33.2 g/dL (ref 30.0–36.0)
MCV: 102.9 fl — ABNORMAL HIGH (ref 78.0–100.0)
Monocytes Absolute: 1 10*3/uL (ref 0.1–1.0)
Monocytes Relative: 15.8 % — ABNORMAL HIGH (ref 3.0–12.0)
Neutro Abs: 4.5 10*3/uL (ref 1.4–7.7)
Neutrophils Relative %: 70.2 % (ref 43.0–77.0)
Platelets: 178 10*3/uL (ref 150.0–400.0)
RBC: 2.85 Mil/uL — ABNORMAL LOW (ref 4.22–5.81)
RDW: 13.8 % (ref 11.5–14.6)
WBC: 6.4 10*3/uL (ref 4.5–10.5)

## 2012-08-14 LAB — BASIC METABOLIC PANEL
BUN: 50 mg/dL — ABNORMAL HIGH (ref 6–23)
CO2: 27 mEq/L (ref 19–32)
Calcium: 8.7 mg/dL (ref 8.4–10.5)
Chloride: 99 mEq/L (ref 96–112)
Creatinine, Ser: 1.7 mg/dL — ABNORMAL HIGH (ref 0.4–1.5)
GFR: 40.27 mL/min — ABNORMAL LOW (ref >60.00)
Glucose, Bld: 187 mg/dL — ABNORMAL HIGH (ref 70–99)
Potassium: 4.5 mEq/L (ref 3.5–5.1)
Sodium: 134 mEq/L — ABNORMAL LOW (ref 135–145)

## 2012-08-14 LAB — TSH: TSH: 2.23 u[IU]/mL (ref 0.35–5.50)

## 2012-08-14 MED ORDER — LOSARTAN POTASSIUM 50 MG PO TABS: 50.0000 mg | ORAL_TABLET | Freq: Every day | ORAL | Status: DC

## 2012-08-14 NOTE — Assessment & Plan Note (Addendum)
His rate is ok. He is on his coumadin. No actual syncope. This spell that he had sounds more like his blood sugar. He has pretty impressive cold intolerance. We will recheck a TSH today.

## 2012-08-14 NOTE — Progress Notes (Signed)
Douglas George Date of Birth: 1927-06-13 Medical Record #[id number]  History of Present Illness: Douglas George is seen back today for his follow up visit. He is seen for Dr. Antoine Poche. He is a former patient of Dr. Ronnald Nian. He has multiple medical issues which include chronic atrial fib, on cvoumadin, past alcohol abuse, DM, HLD, depression, ischemic heart disease with prior stenting of the RCA and a residual 60 to 70% LAD stenosis back in 1998. Last stress test was in 2011 showing RV enlargement with no ischemia and a normal EF. Other problems include hypothyroidism, OA and valvular heart disease. He had his last echo back in 2011 showing an EF of 50 to 55%, moderate AI, moderate MR, LAE, RAE, moderate TR and peak PA pressures up to 74mm Hg.   He comes in today. He is here with his wife, Douglas George. He was last here in March. Dr. Antoine Poche stopped his metoprolol due to bradycardia. He has done ok up until about a month ago. He was folding up his walker and tripped over the wheel. He fell and hit his head. Did get a CT of his head. No acute abnormality. Then about 10 days ago, he got very cold and was shaking. His wife notes that his entire body seemed "frozen". She couldn't get him up. Took about 10 minutes for this to resolve. She gave him a fair amount of OJ and later checked his sugar. It was 114. Did not check his BP or pulse. He did not pass out. He was talking and not confused. He is always cold. He is wearing long sleeves and sweat pants. He has the fireplace on in his side of the home while his wife is running the A/C. He has done fine since then and feels back to his baseline.   Current Outpatient Prescriptions on File Prior to Visit  Medication Sig Dispense Refill  . ACCU-CHEK AVIVA PLUS test strip Use as directed       . Ascorbic Acid (VITAMIN C) 1000 MG tablet Take 1,000 mg by mouth daily.       . Calcium Carb-Cholecalciferol (CALCIUM 1000 + D) 1000-800 MG-UNIT TABS Take 1 tablet by mouth daily.         . Cholecalciferol (VITAMIN D) 1000 UNITS capsule Take 1,000 Units by mouth daily.       Marland Kitchen doxazosin (CARDURA) 4 MG tablet Take 4 mg by mouth daily.       . furosemide (LASIX) 40 MG tablet Take 40 mg by mouth daily.       Marland Kitchen glipiZIDE (GLUCOTROL) 2.5 MG 24 hr tablet Take 2.5 mg by mouth daily.       Marland Kitchen glucosamine-chondroitin 500-400 MG tablet Take 1 tablet by mouth daily.      Marland Kitchen levothyroxine (SYNTHROID, LEVOTHROID) 150 MCG tablet Take 137 mcg by mouth daily.       Marland Kitchen omeprazole (PRILOSEC) 20 MG capsule Take 20 mg by mouth daily.       Marland Kitchen oxybutynin (DITROPAN) 5 MG tablet Take 5 mg by mouth as needed.        . rosuvastatin (CRESTOR) 5 MG tablet Take 5 mg by mouth daily.       . vitamin B-12 (CYANOCOBALAMIN) 500 MCG tablet Take 500 mcg by mouth daily.        Marland Kitchen warfarin (COUMADIN) 2.5 MG tablet Take 2.5-5 mg by mouth See admin instructions. Pt takes one tablet of 2.5 mg Monday, Tuesday, Wednesday, Thursday, Saturday and Sunday. He takes two  tablets on Friday = 5 mg      . losartan (COZAAR) 50 MG tablet Take 1 tablet (50 mg total) by mouth daily.  30 tablet  3    Allergies  Allergen Reactions  . Codeine     Past Medical History  Diagnosis Date  . Edema   . Hearing loss   . SOB (shortness of breath)     WITH WALKING  . Difficulty walking   . Skin change   . Chronic atrial fibrillation   . Alcohol abuse     PAST HISTORY  . Diabetes mellitus   . Hyperlipidemia   . Depression   . Ischemic heart disease     remote stenting of the RCA in 1998 with residual LAD disease of 60 to 70% with negative nuclear in 2011  . Enlarged RV (right ventricle)     per nuclear in 2011  . Pulmonary hypertension     per echo in 2011  . VHD (valvular heart disease)     per echo in 2011 with EF 50 to 55%, moderate AI, moderate MR, LAE, RAE, moderate TR and peak PA pressures up to 74mm    Past Surgical History  Procedure Date  . Thyroidectomy   . Left wrist surgery   . Achilles tendon repair     Left   . Shrapnel     [place]   . Tonsillectomy and adenoidectomy   . Inguinal hernia repair   . Cataract extraction     History  Smoking status  . Former Smoker  . Quit date: 12/31/1952  Smokeless tobacco  . Never Used    History  Alcohol Use No    Family History  Problem Relation Age of Onset  . Heart failure Mother 2  . Diabetes Mother 58  . Heart attack Father 65  . Stroke Father 65  . Aortic aneurysm Father 66    ABDOMINAL    Review of Systems: The review of systems is per the HPI. No chest pain. Not really short of breath.  He also notes a dry hacky cough for several months that he does not know where it is coming from. All other systems were reviewed and are negative.  Physical Exam: BP 144/58  Pulse 76  Ht 5\' 6"  (1.676 m)  Wt 157 lb (71.215 kg)  BMI 25.34 kg/m2 Patient is very pleasant and in no acute distress. He is starting to look frail. Using a walker. Skin is warm and dry. Color is normal.  HEENT is unremarkable. Normocephalic/atraumatic. PERRL. Sclera are nonicteric. Neck is supple. No masses. No JVD. Lungs are clear. Cardiac exam shows an irregular rhythm. His rate is ok. Abdomen is soft. Extremities are without edema. Gait and ROM are intact. No gross neurologic deficits noted.   LABORATORY DATA: PENDING  Lab Results  Component Value Date   WBC 5.7 07/12/2006   HGB 10.6* 07/12/2006   HCT 30.8* 07/12/2006   PLT 207 07/12/2006   GLUCOSE 82 07/12/2006   ALT 22 07/12/2006   AST 28 07/12/2006   NA 137 07/12/2006   K 4.0 07/12/2006   CL 100 07/12/2006   CREATININE 1.00 07/12/2006   BUN 18 07/12/2006   CO2 28 07/12/2006   TSH 0.260* 06/19/2006   INR 2.9 08/08/2012     Assessment / Plan:

## 2012-08-14 NOTE — Patient Instructions (Addendum)
Stop the Ramipril. I think this is why you are coughing  We are going to put you on Losartan 50 mg daily   We are going to check labs today  I want to see you in a month.  If he has another spell, please try to check his sugar, BP and heart rate.   Call the [facility] office at [phone] if you have any questions, problems or concerns.

## 2012-08-14 NOTE — Assessment & Plan Note (Signed)
He has this dry hacky cough. He is on ACE. I have stopped the Altace and switched him over to Losartan 50 mg. I will see him back in a month. Patient is agreeable to this plan and will call if any problems develop in the interim.

## 2012-08-14 NOTE — Assessment & Plan Note (Signed)
He has had what sounds more like low blood sugar. We will recheck some labs today. If he has recurrence, I have asked his wife to try and check a BP, pulse and blood sugar. I have left him on his current medicines for now except for the change from ACE to ARB.

## 2012-08-19 ENCOUNTER — Telehealth: Payer: Self-pay | Admitting: *Deleted

## 2012-08-19 NOTE — Telephone Encounter (Signed)
F/u   Patient wife returning call back to [facility]

## 2012-08-19 NOTE — Telephone Encounter (Signed)
LMOM

## 2012-08-19 NOTE — Telephone Encounter (Signed)
Message copied by Awilda Bill on Tue Aug 19, 2012  9:50 AM ------      Message from: Rosalio Macadamia      Created: Fri Aug 15, 2012  7:58 AM       Ok to report. Please send copy to Dr. Lucianne Muss. Is he still being treated for his anemia? Kidneys look a little more dry. Needs to try and increase his water intake. Recheck BMET and CBC when I see him back.

## 2012-08-19 NOTE — Telephone Encounter (Signed)
Patients wife returned my call and we discussed pts anemia and water intake.  Pt will follow-up with Dr. Lucianne Muss about restarting Iron supplement and possibly a stool softener.   Pts spouse aware that patient should be drinking more water.  Vista Mink, CMA

## 2012-09-05 ENCOUNTER — Ambulatory Visit (INDEPENDENT_AMBULATORY_CARE_PROVIDER_SITE_OTHER): Payer: Medicare Other

## 2012-09-05 DIAGNOSIS — I4891 Unspecified atrial fibrillation: Secondary | ICD-10-CM

## 2012-09-17 ENCOUNTER — Ambulatory Visit (INDEPENDENT_AMBULATORY_CARE_PROVIDER_SITE_OTHER): Payer: Medicare Other | Admitting: Nurse Practitioner

## 2012-09-17 ENCOUNTER — Encounter: Payer: Self-pay | Admitting: Nurse Practitioner

## 2012-09-17 VITALS — BP 136/58 | HR 55 | Ht 66.0 in | Wt 158.1 lb

## 2012-09-17 DIAGNOSIS — D509 Iron deficiency anemia, unspecified: Secondary | ICD-10-CM

## 2012-09-17 DIAGNOSIS — I4891 Unspecified atrial fibrillation: Secondary | ICD-10-CM

## 2012-09-17 LAB — CBC WITH DIFFERENTIAL/PLATELET
Basophils Absolute: 0 10*3/uL (ref 0.0–0.1)
Basophils Relative: 0.6 % (ref 0.0–3.0)
Eosinophils Absolute: 0.2 10*3/uL (ref 0.0–0.7)
Eosinophils Relative: 4.3 % (ref 0.0–5.0)
HCT: 29.1 % — ABNORMAL LOW (ref 39.0–52.0)
Hemoglobin: 9.8 g/dL — ABNORMAL LOW (ref 13.0–17.0)
Lymphocytes Relative: 15.9 % (ref 12.0–46.0)
Lymphs Abs: 0.8 10*3/uL (ref 0.7–4.0)
MCHC: 33.6 g/dL (ref 30.0–36.0)
MCV: 100.9 fl — ABNORMAL HIGH (ref 78.0–100.0)
Monocytes Absolute: 0.8 10*3/uL (ref 0.1–1.0)
Monocytes Relative: 14.4 % — ABNORMAL HIGH (ref 3.0–12.0)
Neutro Abs: 3.4 10*3/uL (ref 1.4–7.7)
Neutrophils Relative %: 64.8 % (ref 43.0–77.0)
Platelets: 118 10*3/uL — ABNORMAL LOW (ref 150.0–400.0)
RBC: 2.89 Mil/uL — ABNORMAL LOW (ref 4.22–5.81)
RDW: 14 % (ref 11.5–14.6)
WBC: 5.2 10*3/uL (ref 4.5–10.5)

## 2012-09-17 LAB — BASIC METABOLIC PANEL
BUN: 50 mg/dL — ABNORMAL HIGH (ref 6–23)
CO2: 26 mEq/L (ref 19–32)
Calcium: 9 mg/dL (ref 8.4–10.5)
Chloride: 102 mEq/L (ref 96–112)
Creatinine, Ser: 1.5 mg/dL (ref 0.4–1.5)
GFR: 48.97 mL/min — ABNORMAL LOW (ref >60.00)
Glucose, Bld: 113 mg/dL — ABNORMAL HIGH (ref 70–99)
Potassium: 4.1 mEq/L (ref 3.5–5.1)
Sodium: 135 mEq/L (ref 135–145)

## 2012-09-17 NOTE — Progress Notes (Signed)
[name] Date of Birth: 12-28-1927 Medical Record #[id number]  History of Present Illness: Douglas George is seen back today for a one month check. He is seen for Dr. Antoine Poche. He has multiple medical issues which include chronic atrial fib, on cvoumadin, past alcohol abuse, DM, HLD, depression, ischemic heart disease with prior stenting of the RCA and a residual 60 to 70% LAD stenosis back in 1998. Last stress test was in 2011 showing RV enlargement with no ischemia and a normal EF. Other problems include hypothyroidism, OA and valvular heart disease. He had his last echo back in 2011 showing an EF of 50 to 55%, moderate AI, moderate MR, LAE, RAE, moderate TR and peak PA pressures up to 74mm Hg. I saw him a month ago and switched him from ACE to ARB for a cough and just general medical issues.   He comes in today. He is here with his wife [name]. Seems to be doing better. His cough cleared within just a couple of days from his med change. He now has a little URI, but no fever or chills. It has been going around their house. His last labs did show anemia. He sees Dr. Lucianne Muss who has given him Procrit in the past. He has restarted his iron but it is hard for him to take due to GI issues.  No chest pain. Not short of breath. He wants to alternate his diuretic between a half and whole tablet. Does have CRI. Overall, he seems to be doing ok and holding his own.   Current Outpatient Prescriptions on File Prior to Visit  Medication Sig Dispense Refill  . ACCU-CHEK AVIVA PLUS test strip Use as directed       . Ascorbic Acid (VITAMIN C) 1000 MG tablet Take 1,000 mg by mouth daily.       . Calcium Carb-Cholecalciferol (CALCIUM 1000 + D) 1000-800 MG-UNIT TABS Take 1 tablet by mouth daily.       . Cholecalciferol (VITAMIN D) 1000 UNITS capsule Take 1,000 Units by mouth daily.       Marland Kitchen doxazosin (CARDURA) 4 MG tablet Take 4 mg by mouth daily.       . furosemide (LASIX) 40 MG tablet Take 40 mg by mouth daily.       Marland Kitchen  glipiZIDE (GLUCOTROL) 2.5 MG 24 hr tablet Take 2.5 mg by mouth daily.       Marland Kitchen glucosamine-chondroitin 500-400 MG tablet Take 1 tablet by mouth daily.      Marland Kitchen levothyroxine (SYNTHROID, LEVOTHROID) 150 MCG tablet Take 137 mcg by mouth daily.       Marland Kitchen losartan (COZAAR) 50 MG tablet Take 1 tablet (50 mg total) by mouth daily.  30 tablet  3  . Magnesium 250 MG TABS Take 1 tablet by mouth.      Marland Kitchen omeprazole (PRILOSEC) 20 MG capsule Take 20 mg by mouth daily.       Marland Kitchen oxybutynin (DITROPAN) 5 MG tablet Take 5 mg by mouth as needed.        . rosuvastatin (CRESTOR) 5 MG tablet Take 5 mg by mouth daily.       . vitamin B-12 (CYANOCOBALAMIN) 500 MCG tablet Take 500 mcg by mouth daily.        Marland Kitchen warfarin (COUMADIN) 2.5 MG tablet Take 2.5-5 mg by mouth See admin instructions. Pt takes one tablet of 2.5 mg Monday, Tuesday, Wednesday, Thursday, Saturday and Sunday. He takes two tablets on Friday = 5 mg  Allergies  Allergen Reactions  . Codeine     Past Medical History  Diagnosis Date  . Edema   . Hearing loss   . SOB (shortness of breath)     WITH WALKING  . Difficulty walking   . Skin change   . Chronic atrial fibrillation   . Alcohol abuse     PAST HISTORY  . Diabetes mellitus   . Hyperlipidemia   . Depression   . Ischemic heart disease     remote stenting of the RCA in 1998 with residual LAD disease of 60 to 70% with negative nuclear in 2011  . Enlarged RV (right ventricle)     per nuclear in 2011  . Pulmonary hypertension     per echo in 2011  . VHD (valvular heart disease)     per echo in 2011 with EF 50 to 55%, moderate AI, moderate MR, LAE, RAE, moderate TR and peak PA pressures up to 74mm    Past Surgical History  Procedure Date  . Thyroidectomy   . Left wrist surgery   . Achilles tendon repair     Left  . Shrapnel     [place]   . Tonsillectomy and adenoidectomy   . Inguinal hernia repair   . Cataract extraction     History  Smoking status  . Former Smoker  . Quit  date: 12/31/1952  Smokeless tobacco  . Never Used    History  Alcohol Use No    Family History  Problem Relation Age of Onset  . Heart failure Mother 29  . Diabetes Mother 107  . Heart attack Father 3  . Stroke Father 30  . Aortic aneurysm Father 68    ABDOMINAL    Review of Systems: The review of systems is per the HPI.  He is using tylenol for his arthritis. All other systems were reviewed and are negative.  Physical Exam: BP 136/58  Pulse 55  Ht 5\' 6"  (1.676 m)  Wt 158 lb 1.9 oz (71.723 kg)  BMI 25.52 kg/m2 Patient is very pleasant and in no acute distress. He does appear chronically ill. Skin is warm and dry. Color is normal.  HEENT is unremarkable. Normocephalic/atraumatic. PERRL. Sclera are nonicteric. Neck is supple. No masses. No JVD. Lungs are clear. Cardiac exam shows an irregular rhythm. His rate is controlled. Abdomen is soft. Extremities are without edema. He has support stockings on. Gait is not tested. He is in a wheelchair. ROM appears intact. No gross neurologic deficits noted.   LABORATORY DATA:  Lab Results  Component Value Date   WBC 6.4 08/14/2012   HGB 9.8* 08/14/2012   HCT 29.4* 08/14/2012   PLT 178.0 08/14/2012   GLUCOSE 187* 08/14/2012   ALT 22 07/12/2006   AST 28 07/12/2006   NA 134* 08/14/2012   K 4.5 08/14/2012   CL 99 08/14/2012   CREATININE 1.7* 08/14/2012   BUN 50* 08/14/2012   CO2 27 08/14/2012   TSH 2.23 08/14/2012   INR 2.4 09/05/2012     Assessment / Plan: 1. Probable ACE cough - resolved with changing to ARB. Does have a URI that has been going around. No fever or chills.  2. Chronic atrial fib - on coumadin  3. Anemia - may need to restart Procrit  4. CRI - rechecking BMET today. He may alternate his dose of Lasix between a half and whole tablet.  We will see him back in about 4 months. Overall, I think he is  stable and holding his own.   Patient is agreeable to this plan and will call if any problems develop in the interim.

## 2012-09-17 NOTE — Patient Instructions (Addendum)
We are going to recheck your labs today  Stay on your current medicines and you can alternate your Lasix between a half and whole tablet  We will see you back in about 4 months.  Call the [facility] office at [phone] if you have any questions, problems or concerns.

## 2012-09-18 ENCOUNTER — Telehealth: Payer: Self-pay | Admitting: *Deleted

## 2012-09-18 NOTE — Telephone Encounter (Signed)
Message copied by Awilda Bill on Thu Sep 18, 2012 11:49 AM ------      Message from: Rosalio Macadamia      Created: Wed Sep 17, 2012  3:37 PM       Ok to report. Still with some anemia. Has chronic kidney disease. He was given the ok to alternate his Lasix from 40 to 80 mg. Please send note and labs to Dr. Lucianne Muss

## 2012-09-18 NOTE — Telephone Encounter (Signed)
Pt aware of lab results.  Douglas George, CMA

## 2012-10-21 ENCOUNTER — Ambulatory Visit (INDEPENDENT_AMBULATORY_CARE_PROVIDER_SITE_OTHER): Payer: Medicare Other

## 2012-10-21 DIAGNOSIS — I4891 Unspecified atrial fibrillation: Secondary | ICD-10-CM

## 2012-10-21 LAB — POCT INR: INR: 1.4

## 2012-10-29 ENCOUNTER — Other Ambulatory Visit: Payer: Self-pay | Admitting: Cardiology

## 2012-10-29 DIAGNOSIS — I6529 Occlusion and stenosis of unspecified carotid artery: Secondary | ICD-10-CM

## 2012-10-31 ENCOUNTER — Encounter (INDEPENDENT_AMBULATORY_CARE_PROVIDER_SITE_OTHER): Payer: Medicare Other

## 2012-10-31 ENCOUNTER — Ambulatory Visit (INDEPENDENT_AMBULATORY_CARE_PROVIDER_SITE_OTHER): Payer: Medicare Other

## 2012-10-31 DIAGNOSIS — I6529 Occlusion and stenosis of unspecified carotid artery: Secondary | ICD-10-CM

## 2012-10-31 DIAGNOSIS — I4891 Unspecified atrial fibrillation: Secondary | ICD-10-CM

## 2012-10-31 LAB — POCT INR: INR: 2.4

## 2012-11-11 ENCOUNTER — Other Ambulatory Visit: Payer: Self-pay | Admitting: Dermatology

## 2012-11-18 ENCOUNTER — Telehealth: Payer: Self-pay | Admitting: Cardiology

## 2012-11-18 NOTE — Telephone Encounter (Signed)
Left pt a message to call back. 

## 2012-11-18 NOTE — Telephone Encounter (Signed)
Pt's wife called back. Wife  would like to now if Dr. Antoine Poche can prescribe Prednisone or Darvocet for pt's shoulder pain. Wife states Dr. Lucianne Muss pt's PCP is reluctant to order anything for his shoulder pain, because pt is taken coumadin, and Dr Deborah Chalk did order medication for that. Pt's wife would like to know if MD Hochrein would  Order one of these medications. Pt  And wife advised   to call pt's PCP to see if he can order these medication  for pt. Pt will call back if PCP refused to order anything.

## 2012-11-18 NOTE — Telephone Encounter (Signed)
New problem:  C/O shoulder pain. Please advise which medication would be best to use.

## 2012-11-19 ENCOUNTER — Other Ambulatory Visit: Payer: Self-pay | Admitting: Nurse Practitioner

## 2012-11-21 ENCOUNTER — Ambulatory Visit (INDEPENDENT_AMBULATORY_CARE_PROVIDER_SITE_OTHER): Payer: Medicare Other | Admitting: *Deleted

## 2012-11-21 DIAGNOSIS — I4891 Unspecified atrial fibrillation: Secondary | ICD-10-CM

## 2012-12-05 ENCOUNTER — Ambulatory Visit (INDEPENDENT_AMBULATORY_CARE_PROVIDER_SITE_OTHER): Payer: Medicare Other | Admitting: *Deleted

## 2012-12-05 DIAGNOSIS — I4891 Unspecified atrial fibrillation: Secondary | ICD-10-CM

## 2012-12-08 ENCOUNTER — Other Ambulatory Visit: Payer: Self-pay | Admitting: Nurse Practitioner

## 2012-12-18 ENCOUNTER — Ambulatory Visit (INDEPENDENT_AMBULATORY_CARE_PROVIDER_SITE_OTHER): Payer: Medicare Other

## 2012-12-18 DIAGNOSIS — I4891 Unspecified atrial fibrillation: Secondary | ICD-10-CM

## 2012-12-18 LAB — POCT INR: INR: 3.1

## 2013-01-08 ENCOUNTER — Ambulatory Visit (INDEPENDENT_AMBULATORY_CARE_PROVIDER_SITE_OTHER): Payer: Medicare Other | Admitting: Pharmacist

## 2013-01-08 DIAGNOSIS — I4891 Unspecified atrial fibrillation: Secondary | ICD-10-CM

## 2013-01-09 ENCOUNTER — Other Ambulatory Visit: Payer: Self-pay | Admitting: Endocrinology

## 2013-01-15 ENCOUNTER — Encounter: Payer: Self-pay | Admitting: Cardiology

## 2013-01-15 ENCOUNTER — Ambulatory Visit (INDEPENDENT_AMBULATORY_CARE_PROVIDER_SITE_OTHER): Payer: Medicare Other | Admitting: Cardiology

## 2013-01-15 VITALS — BP 132/43 | HR 42 | Ht 66.0 in | Wt 162.0 lb

## 2013-01-15 DIAGNOSIS — I517 Cardiomegaly: Secondary | ICD-10-CM

## 2013-01-15 DIAGNOSIS — I482 Chronic atrial fibrillation, unspecified: Secondary | ICD-10-CM

## 2013-01-15 DIAGNOSIS — I259 Chronic ischemic heart disease, unspecified: Secondary | ICD-10-CM

## 2013-01-15 DIAGNOSIS — I4891 Unspecified atrial fibrillation: Secondary | ICD-10-CM

## 2013-01-15 DIAGNOSIS — I2789 Other specified pulmonary heart diseases: Secondary | ICD-10-CM

## 2013-01-15 DIAGNOSIS — I272 Pulmonary hypertension, unspecified: Secondary | ICD-10-CM

## 2013-01-15 NOTE — Patient Instructions (Addendum)
Hold Coumadin for now. Continue all other medications as listed.  Your physician has requested that you have an echocardiogram. Echocardiography is a painless test that uses sound waves to create images of your heart. It provides your doctor with information about the size and shape of your heart and how well your heart's chambers and valves are working. This procedure takes approximately one hour. There are no restrictions for this procedure.  Follow up with Dr Antoine Poche in 3 months.

## 2013-01-15 NOTE — Progress Notes (Signed)
HPI The patient presents for follow of atrial fib and CAD.  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 He's had to have blood withdrawn. He's had chronic anemia. He was sent back here to consider using another anticoagulant for his atrial fibrillation. He's not had any new cardiovascular complaints. He doesn't feel the palpitations, presyncope or syncope. He has had no chest pressure, neck or arm discomfort. He's had continued lower extremity swelling. Her he's had no PND or orthopnea however.  Allergies  Allergen Reactions  . Codeine     Current Outpatient Prescriptions  Medication Sig Dispense Refill  . ACCU-CHEK AVIVA PLUS test strip Use as directed       . Ascorbic Acid (VITAMIN C) 1000 MG tablet Take 1,000 mg by mouth daily.       . Calcium Carb-Cholecalciferol (CALCIUM 1000 + D) 1000-800 MG-UNIT TABS Take 1 tablet by mouth daily.       . Cholecalciferol (VITAMIN D) 1000 UNITS capsule Take 1,000 Units by mouth daily.       Marland Kitchen doxazosin (CARDURA) 4 MG tablet Take 4 mg by mouth daily.       . furosemide (LASIX) 40 MG tablet Take 40 mg by mouth daily.       Marland Kitchen glipiZIDE (GLUCOTROL) 2.5 MG 24 hr tablet Take 2.5 mg by mouth daily.       Marland Kitchen glucosamine-chondroitin 500-400 MG tablet Take 1 tablet by mouth daily.      Marland Kitchen levothyroxine (SYNTHROID, LEVOTHROID) 150 MCG tablet Take 137 mcg by mouth daily.       Marland Kitchen losartan (COZAAR) 50 MG tablet TAKE 1 TABLET BY MOUTH DAILY  30 tablet  11  . Magnesium 250 MG TABS Take 1 tablet by mouth.      Marland Kitchen omeprazole (PRILOSEC) 20 MG capsule Take 20 mg by mouth daily.       Marland Kitchen oxybutynin (DITROPAN) 5 MG tablet Take 5 mg by mouth as needed.        . rosuvastatin (CRESTOR) 5 MG tablet Take 5 mg by mouth daily.       . vitamin B-12 (CYANOCOBALAMIN) 500 MCG tablet Take 500 mcg by mouth daily.        Marland Kitchen warfarin (COUMADIN) 2.5 MG tablet Take 2.5-5 mg by mouth See admin instructions. Pt takes one tablet of 2.5 mg  Monday, Tuesday, Wednesday, Thursday, Saturday and Sunday. He takes two tablets on Friday = 5 mg        Past Medical History  Diagnosis Date  . Edema   . Hearing loss   . SOB (shortness of breath)     WITH WALKING  . Difficulty walking   . Skin change   . Chronic atrial fibrillation   . Alcohol abuse     PAST HISTORY  . Diabetes mellitus   . Hyperlipidemia   . Depression   . Ischemic heart disease     remote stenting of the RCA in 1998 with residual LAD disease of 60 to 70% with negative nuclear in 2011  . Enlarged RV (right ventricle)     per nuclear in 2011  . Pulmonary hypertension     per echo in 2011  . VHD (valvular heart disease)     per echo in 2011 with EF 50 to 55%, moderate AI, moderate MR, LAE, RAE, moderate TR and peak PA pressures up to 74mm    Past Surgical History  Procedure Date  .  Thyroidectomy   . Left wrist surgery   . Achilles tendon repair     Left  . Shrapnel     [place]   . Tonsillectomy and adenoidectomy   . Inguinal hernia repair   . Cataract extraction     ROS:  As stated in the HPI and negative for all other systems.  PHYSICAL EXAM BP 132/43  Pulse 42  Ht 5\' 6"  (1.676 m)  Wt 162 lb (73.483 kg)  BMI 26.15 kg/m2 PHYSICAL EXAM GEN:  No distress NECK:  No jugular venous distention at 90 degrees, waveform within normal limits, carotid upstroke brisk and symmetric, no bruits, no thyromegaly LUNGS:  Clear to auscultation bilaterally BACK:  No CVA tenderness CHEST:  Unremarkable HEART:  S1 and S2 within normal limits, no S3,  no clicks, no rubs, right lower sternal border holosystolic murmur ABD:  Positive bowel sounds normal in frequency in pitch, no bruits, no rebound, no guarding, unable to assess midline mass or bruit with the patient seated. EXT:  2 plus pulses throughout, moderate edema,  NEURO:  Cranial nerves II through XII grossly intact, motor grossly intact throughout PSYCH:  Cognitively intact, oriented to person place and  time  EKG:  Atrial fibrillation rate 42 axis and intervals within normal limits, no acute ST-T wave changes. 01/15/2013  ASSESSMENT AND PLAN  Atrial fibrillation -  I had a long discussion with the patient and his wife and his family about the risk benefits of anticoagulation. [name] has a CHA2DS2 - VASc score of 5 with a risk of stroke of 6.7%  and a HAS - BLED score of 3.with one validation study suggesting a high risk of bleeding.  With recurrent iron deficiency anemia and hemarthrosis it is most prudent to stay off of warfarin for now. There would not be an advantage in this situation to one of the newer agents as they would still increase the risk of bleeding. We will reconsider starting this therapy in 3 or 4 months to see if he has any improvement in his chronic hemarthrosis and anemia.  Enlarged RV (right ventricle) -  He has some increased swelling and I would like to follow this up with an echocardiogram.  Ischemic heart disease -  He has no active angina. He had a stress perfusion study in 2011 the without significant ischemia. No further evaluation is indicated. He will continue with risk reduction.   Edema -  We discussed conservative therapy. No change in medications is indicated. I will check the echo as above.  Bradycardia - He stopped his beta blocker at the last visit. He surprisingly has no symptoms with this. I did discuss symptoms that might require further management such as a pacemaker.

## 2013-01-16 ENCOUNTER — Ambulatory Visit (HOSPITAL_COMMUNITY): Payer: Medicare Other | Attending: Internal Medicine | Admitting: Radiology

## 2013-01-16 DIAGNOSIS — R609 Edema, unspecified: Secondary | ICD-10-CM | POA: Insufficient documentation

## 2013-01-16 DIAGNOSIS — I259 Chronic ischemic heart disease, unspecified: Secondary | ICD-10-CM | POA: Insufficient documentation

## 2013-01-16 DIAGNOSIS — E785 Hyperlipidemia, unspecified: Secondary | ICD-10-CM | POA: Insufficient documentation

## 2013-01-16 DIAGNOSIS — I4891 Unspecified atrial fibrillation: Secondary | ICD-10-CM | POA: Insufficient documentation

## 2013-01-16 DIAGNOSIS — I2510 Atherosclerotic heart disease of native coronary artery without angina pectoris: Secondary | ICD-10-CM | POA: Insufficient documentation

## 2013-01-16 DIAGNOSIS — E119 Type 2 diabetes mellitus without complications: Secondary | ICD-10-CM | POA: Insufficient documentation

## 2013-01-16 DIAGNOSIS — I2789 Other specified pulmonary heart diseases: Secondary | ICD-10-CM | POA: Insufficient documentation

## 2013-01-16 DIAGNOSIS — I359 Nonrheumatic aortic valve disorder, unspecified: Secondary | ICD-10-CM

## 2013-01-16 NOTE — Progress Notes (Signed)
Echocardiogram performed.  

## 2013-01-21 ENCOUNTER — Telehealth: Payer: Self-pay | Admitting: Cardiology

## 2013-01-21 ENCOUNTER — Encounter (HOSPITAL_COMMUNITY)
Admission: RE | Admit: 2013-01-21 | Discharge: 2013-01-21 | Disposition: A | Discharge status: Home / Self Care | Payer: Medicare Other | Source: Ambulatory Visit | Attending: Endocrinology | Admitting: Endocrinology

## 2013-01-21 DIAGNOSIS — D649 Anemia, unspecified: Secondary | ICD-10-CM | POA: Insufficient documentation

## 2013-01-21 MED ORDER — FERUMOXYTOL INJECTION 510 MG/17 ML: 510.0000 mg | Freq: Once | INTRAVENOUS | Status: DC

## 2013-01-21 MED ORDER — FERUMOXYTOL INJECTION 510 MG/17 ML
510.0000 mg | Freq: Once | INTRAVENOUS | Status: AC
  Administered 2013-01-21: 510 mg via INTRAVENOUS

## 2013-01-21 MED ORDER — SODIUM CHLORIDE 0.9 % IV SOLN
INTRAVENOUS | Status: DC
  Administered 2013-01-21: 10:00:00 via INTRAVENOUS

## 2013-01-21 MED ORDER — FERUMOXYTOL INJECTION 510 MG/17 ML
INTRAVENOUS | Status: AC
  Filled 2013-01-21: qty 17

## 2013-01-21 NOTE — Telephone Encounter (Signed)
New problem:  Test results.  

## 2013-01-28 NOTE — Telephone Encounter (Signed)
Follow-up:    Patient's wife called in because she still has not heard back regarding her initial call to know the results of her husband's ECHO on 01/16/13.  Please call back.

## 2013-02-10 ENCOUNTER — Other Ambulatory Visit: Payer: Self-pay | Admitting: *Deleted

## 2013-02-10 DIAGNOSIS — I272 Pulmonary hypertension, unspecified: Secondary | ICD-10-CM

## 2013-02-10 NOTE — Telephone Encounter (Signed)
Pt called with echo results once Dr Antoine Poche reviewed them

## 2013-02-11 ENCOUNTER — Telehealth: Payer: Self-pay | Admitting: Cardiology

## 2013-02-11 NOTE — Telephone Encounter (Signed)
Per wife - she would like to know if pt needs to (1) restart coumadin.  Explained to her that Dr Antoine Poche would have to get that order and I will send this request to him.   She also wanted to know if there is (2) anything different they should be doing prior to being seen either by Dr Delton Coombes or Dr Antoine Poche.  Instructed to continue medications as ordered and call with any problems.   Of note - the (3) appointment with Dr Delton Coombes is not until March 6th.  Will make sure with Dr Antoine Poche OK to wait this long to be seen.  She is aware I will call back as soon as Douglas George an answer to these questions

## 2013-02-11 NOTE — Telephone Encounter (Signed)
New Problem    Pt has some questions about results from testing. Would like to speak to nurse.

## 2013-02-11 NOTE — Telephone Encounter (Signed)
Douglas George wants to know if Douglas George should go back on his coumadin based on the results of the echo?

## 2013-02-14 ENCOUNTER — Other Ambulatory Visit: Payer: Self-pay

## 2013-02-16 NOTE — Telephone Encounter (Signed)
With recurrent iron deficiency anemia and hemarthrosis it is most prudent to stay off of warfarin for now. There would not be an advantage in this situation to one of the newer agents as they would still increase the risk of bleeding. We will reconsider starting this therapy in 3 or 4 months to see if he has any improvement in his chronic hemarthrosis and anemia.

## 2013-02-17 NOTE — Telephone Encounter (Signed)
Spoke with wife who is aware of Dr Hochrein's comments.  She had pts appt moved up to 2/25 with Dr Delton Coombes and the pt will be evaluated then.  Once this occurs a decision will be made as to when/if pt needs cardiac cath.

## 2013-02-23 ENCOUNTER — Encounter: Payer: Self-pay | Admitting: Internal Medicine

## 2013-02-23 ENCOUNTER — Ambulatory Visit (INDEPENDENT_AMBULATORY_CARE_PROVIDER_SITE_OTHER): Payer: Medicare Other | Admitting: Internal Medicine

## 2013-02-23 VITALS — BP 124/62 | HR 64 | Temp 97.6°F | Ht 66.0 in | Wt 151.6 lb

## 2013-02-23 DIAGNOSIS — E785 Hyperlipidemia, unspecified: Secondary | ICD-10-CM

## 2013-02-23 DIAGNOSIS — R262 Difficulty in walking, not elsewhere classified: Secondary | ICD-10-CM

## 2013-02-23 NOTE — Assessment & Plan Note (Signed)
Well controlled on current therapy Continue to monitor blood sugars at home Continue to avoid carbs in your diet

## 2013-02-23 NOTE — Assessment & Plan Note (Signed)
Well controlled on current therapy Continue to avoid fat in your diet

## 2013-02-23 NOTE — Progress Notes (Signed)
HPI  Pt presents to the clinic today to establish care. He has a lot of specialist that manage his conditions, but has never had a PCP. He has no concerns today. Of note, He is diabetic. His sugars at home run between 85-285. He typically test once per day. He reports medication compliance. He does not need any refills today.  Flu: 2013 Pneumovax: due Zostavax: never Tetanus:2013 Colonoscopy: 2000 Eye exam: 2013 Dentist: 2013  Past Medical History  Diagnosis Date  . Edema   . Hearing loss   . SOB (shortness of breath)     WITH WALKING  . Difficulty walking   . Skin change   . Chronic atrial fibrillation   . Alcohol abuse     PAST HISTORY  . Diabetes mellitus   . Hyperlipidemia   . Depression   . Ischemic heart disease     remote stenting of the RCA in 1998 with residual LAD disease of 60 to 70% with negative nuclear in 2011  . Enlarged RV (right ventricle)     per nuclear in 2011  . Pulmonary hypertension     per echo in 2011  . VHD (valvular heart disease)     per echo in 2011 with EF 50 to 55%, moderate AI, moderate MR, LAE, RAE, moderate TR and peak PA pressures up to 74mm  . Arthritis   . Diverticulitis     Current Outpatient Prescriptions  Medication Sig Dispense Refill  . ACCU-CHEK AVIVA PLUS test strip daily. Use as directed      . acetaminophen (TYLENOL) 500 MG tablet Take 1,000 mg by mouth 2 (two) times daily.      . Calcium Carb-Cholecalciferol (CALCIUM 1000 + D) 1000-800 MG-UNIT TABS Take 1 tablet by mouth daily.       . Calcium Carbonate-Vitamin D (CALCIUM-VITAMIN D) 500-200 MG-UNIT per tablet Take 1 tablet by mouth daily.      . Cholecalciferol (VITAMIN D) 1000 UNITS capsule Take 1,000 Units by mouth daily.       Marland Kitchen doxazosin (CARDURA) 8 MG tablet Take 8 mg by mouth at bedtime.      . fish oil-omega-3 fatty acids 1000 MG capsule Take 1 g by mouth daily.      . furosemide (LASIX) 40 MG tablet Take 20 mg by mouth daily.       Marland Kitchen glipiZIDE (GLUCOTROL) 2.5 MG 24  hr tablet Take 2.5 mg by mouth daily.       Marland Kitchen levothyroxine (SYNTHROID, LEVOTHROID) 150 MCG tablet Take 150 mcg by mouth daily.       Marland Kitchen losartan (COZAAR) 50 MG tablet TAKE 1 TABLET BY MOUTH DAILY  30 tablet  11  . Magnesium 250 MG TABS Take 1 tablet by mouth.      . oxybutynin (DITROPAN) 5 MG tablet Take 5 mg by mouth as needed.        . rosuvastatin (CRESTOR) 5 MG tablet Take 5 mg by mouth daily.       . vitamin B-12 (CYANOCOBALAMIN) 1000 MCG tablet Take 1,000 mcg by mouth daily.      . vitamin C (ASCORBIC ACID) 500 MG tablet Take 500 mg by mouth daily.      . ferumoxytol (FERAHEME) 510 MG/17ML SOLN Inject 17 mLs (510 mg total) into the vein once.  15.08 mL  0  . warfarin (COUMADIN) 2.5 MG tablet Take 2.5-5 mg by mouth See admin instructions. Pt takes one tablet of 2.5 mg Monday, Tuesday, Wednesday, Thursday, Saturday  and Sunday. He takes two tablets on Friday = 5 mg       No current facility-administered medications for this visit.    Allergies  Allergen Reactions  . Codeine     Family History  Problem Relation Age of Onset  . Heart failure Mother 12  . Diabetes Mother 25  . Heart attack Father 91  . Stroke Father 11  . Aortic aneurysm Father 29    ABDOMINAL    History   Social History  . Marital Status: Married    Spouse Name: N/A    Number of Children: N/A  . Years of Education: 16+   Occupational History  . Retired    Social History Main Topics  . Smoking status: Former Smoker    Quit date: 12/31/1952  . Smokeless tobacco: Never Used  . Alcohol Use: Yes     Comment: 1 glass wine daily  . Drug Use: No  . Sexually Active: Not Currently   Other Topics Concern  . Not on file   Social History Narrative   No caffeine use   Regular exercise-no    ROS:  Constitutional: Pt reports fatigue. Denies fever, malaise,  headache or abrupt weight changes.  HEENT: Denies eye pain, eye redness, ear pain, ringing in the ears, wax buildup, runny nose, nasal congestion,  bloody nose, or sore throat. Respiratory: Denies difficulty breathing, shortness of breath, cough or sputum production.   Cardiovascular: Denies chest pain, chest tightness, palpitations or swelling in the hands or feet.  Gastrointestinal: Denies abdominal pain, bloating, constipation, diarrhea or blood in the stool.  GU: Pt reports urgency. Denies frequency, pain with urination, blood in urine, odor or discharge. Musculoskeletal: Pt reports difficulty with gait, uses walker. Denies decrease in range of motion,  muscle pain or joint pain and swelling.  Skin: Denies redness, rashes, lesions or ulcercations.  Neurological: Denies dizziness, difficulty with memory, difficulty with speech or problems with balance and coordination.   No other specific complaints in a complete review of systems (except as listed in HPI above).  PE:  BP 124/62  Pulse 64  Temp(Src) 97.6 F (36.4 C) (Oral)  Ht 5\' 6"  (1.676 m)  Wt 151 lb 9.6 oz (68.765 kg)  BMI 24.48 kg/m2  SpO2 93% Wt Readings from Last 3 Encounters:  02/23/13 151 lb 9.6 oz (68.765 kg)  01/21/13 162 lb (73.483 kg)  01/15/13 162 lb (73.483 kg)    General: Appears his stated age, well developed, well nourished in NAD. HEENT: Head: normal shape and size; Eyes: sclera white, no icterus, conjunctiva pink, PERRLA and EOMs intact; Ears: Tm's gray and intact, normal light reflex; Nose: mucosa pink and moist, septum midline; Throat/Mouth: Teeth present, mucosa pink and moist, no lesions or ulcerations noted.  Neck: Normal range of motion. Neck supple, trachea midline. No massses, lumps or thyromegaly present.  Cardiovascular: Normal rate and rhythm. S1,S2 noted.  No murmur, rubs or gallops noted. No JVD or BLE edema. No carotid bruits noted. Pulmonary/Chest: Normal effort and positive vesicular breath sounds. No respiratory distress. No wheezes, rales or ronchi noted.  Abdomen: Soft and nontender. Normal bowel sounds, no bruits noted. No distention or  masses noted. Liver, spleen and kidneys non palpable. Musculoskeletal: Normal range of motion. No signs of joint swelling. No difficulty with gait.  Neurological: Alert and oriented. Cranial nerves II-XII intact. Coordination normal. +DTRs bilaterally. Psychiatric: Mood and affect normal. Behavior is normal. Judgment and thought content normal.    BMET  Component Value Date/Time   NA 135 09/17/2012 1220   K 4.1 09/17/2012 1220   CL 102 09/17/2012 1220   CO2 26 09/17/2012 1220   GLUCOSE 113* 09/17/2012 1220   BUN 50* 09/17/2012 1220   CREATININE 1.5 09/17/2012 1220   CALCIUM 9.0 09/17/2012 1220    Lipid Panel  No results found for this basename: chol, trig, hdl, cholhdl, vldl, ldlcalc    CBC    Component Value Date/Time   WBC 5.2 09/17/2012 1220   WBC 5.7 07/12/2006 0840   RBC 2.89* 09/17/2012 1220   RBC 2.95* 07/12/2006 0840   HGB 9.8* 09/17/2012 1220   HGB 10.6* 07/12/2006 0840   HCT 29.1* 09/17/2012 1220   HCT 30.8* 07/12/2006 0840   PLT 118.0* 09/17/2012 1220   PLT 207 07/12/2006 0840   MCV 100.9* 09/17/2012 1220   MCV 104.5* 07/12/2006 0840   MCH 35.9* 07/12/2006 0840   MCHC 33.6 09/17/2012 1220   MCHC 34.3 07/12/2006 0840   RDW 14.0 09/17/2012 1220   RDW 14.9* 07/12/2006 0840   LYMPHSABS 0.8 09/17/2012 1220   LYMPHSABS 0.7* 07/12/2006 0840   MONOABS 0.8 09/17/2012 1220   MONOABS 0.7 07/12/2006 0840   EOSABS 0.2 09/17/2012 1220   EOSABS 0.1 07/12/2006 0840   BASOSABS 0.0 09/17/2012 1220   BASOSABS 0.0 07/12/2006 0840    Hgb A1C No results found for this basename: HGBA1C     Assessment and Plan:  RTC as needed

## 2013-02-23 NOTE — Assessment & Plan Note (Signed)
Get up and down slowly Use walker as needed

## 2013-02-24 ENCOUNTER — Telehealth: Payer: Self-pay | Admitting: Cardiology

## 2013-02-24 ENCOUNTER — Ambulatory Visit (INDEPENDENT_AMBULATORY_CARE_PROVIDER_SITE_OTHER): Payer: Medicare Other | Admitting: Emergency Medicine

## 2013-02-24 ENCOUNTER — Other Ambulatory Visit: Payer: Medicare Other

## 2013-02-24 ENCOUNTER — Encounter: Payer: Self-pay | Admitting: Emergency Medicine

## 2013-02-24 VITALS — BP 124/70 | HR 45 | Temp 98.2°F | Ht 66.0 in | Wt 151.8 lb

## 2013-02-24 DIAGNOSIS — I272 Pulmonary hypertension, unspecified: Secondary | ICD-10-CM

## 2013-02-24 DIAGNOSIS — I2789 Other specified pulmonary heart diseases: Secondary | ICD-10-CM

## 2013-02-24 NOTE — Progress Notes (Signed)
Subjective:    Patient ID: Douglas George, male    DOB: Mar 31, 1927, 79 y.o.   MRN: [id number]  HPI 79 yo former low exposure smoker (2 pk-yrs), hx DM, OSA (dx 15 yrs ago, not on CPAP),  LE DVT (his wife says these were while he was therapeutic on coumadin), HTN with restrictive diastolic dysfxn, CAD (RCA stent '98, LAD dz), moderate AI and MR, RV dilation and PAH by TTE in 2011 (estimated PASP ) and then confirmed on TTE January 2/14 (estimated PASP 86 mmHg). Followed by Dr Antoine Poche for these issues and A fib on coumadin.  His most recent TTE was performed to evaluate LE edema, has improved since lasix was increased 1/16. He denies any dyspnea, although activity is limited by his joint disease.    Review of Systems  Constitutional: Positive for unexpected weight change. Negative for fever.  HENT: Positive for trouble swallowing and dental problem. Negative for ear pain, nosebleeds, congestion, sore throat, rhinorrhea, sneezing, postnasal drip and sinus pressure.   Eyes: Negative for redness and itching.  Respiratory: Positive for cough. Negative for chest tightness, shortness of breath and wheezing.   Cardiovascular: Positive for palpitations. Negative for leg swelling.  Gastrointestinal: Negative for nausea and vomiting.  Genitourinary: Negative for dysuria.  Musculoskeletal: Negative for joint swelling.  Skin: Negative for rash.  Neurological: Negative for headaches.  Hematological: Does not bruise/bleed easily.  Psychiatric/Behavioral: Negative for dysphoric mood. The patient is not nervous/anxious.    Past Medical History  Diagnosis Date  . Edema   . Hearing loss   . SOB (shortness of breath)     WITH WALKING  . Difficulty walking   . Skin change   . Chronic atrial fibrillation   . Alcohol abuse     PAST HISTORY  . Diabetes mellitus   . Hyperlipidemia   . Depression   . Ischemic heart disease     remote stenting of the RCA in 1998 with residual LAD disease of 60 to 70%  with negative nuclear in 2011  . Enlarged RV (right ventricle)     per nuclear in 2011  . Pulmonary hypertension     per echo in 2011  . VHD (valvular heart disease)     per echo in 2011 with EF 50 to 55%, moderate AI, moderate MR, LAE, RAE, moderate TR and peak PA pressures up to 74mm  . Arthritis   . Diverticulitis   . H/O blood clots     in L leg  . Sleep apnea   . Sinus trouble   . Heart attack      Family History  Problem Relation Age of Onset  . Heart failure Mother 75  . Diabetes Mother 73  . Heart attack Father 63  . Stroke Father 65  . Aortic aneurysm Father 67    ABDOMINAL  . Cancer Son   . Hepatitis Son      History   Social History  . Marital Status: Married    Spouse Name: N/A    Number of Children: 6  . Years of Education: 16+   Occupational History  . Retired     Runner, broadcasting/film/video   Social History Main Topics  . Smoking status: Former Smoker -- 2.00 packs/day for 1 years    Types: Cigarettes    Quit date: 12/31/1952  . Smokeless tobacco: Never Used  . Alcohol Use: Yes     Comment: 1 glass wine daily  . Drug Use: No  .  Sexually Active: Not Currently   Other Topics Concern  . Not on file   Social History Narrative   No caffeine use   Regular exercise-no  [employer] >> [place] with shrapnel in his back. 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   Allergies  Allergen Reactions  . Codeine      Outpatient Prescriptions Prior to Visit  Medication Sig Dispense Refill  . ACCU-CHEK AVIVA PLUS test strip daily. Use as directed      . acetaminophen (TYLENOL) 500 MG tablet Take 1,000 mg by mouth 2 (two) times daily.      . Calcium Carb-Cholecalciferol (CALCIUM 1000 + D) 1000-800 MG-UNIT TABS Take 1 tablet by mouth daily.       . Cholecalciferol (VITAMIN D) 1000 UNITS capsule Take 1,000 Units by mouth daily.       Marland Kitchen doxazosin (CARDURA) 8 MG tablet Take 8 mg by mouth at bedtime.      . fish oil-omega-3 fatty acids 1000 MG capsule Take 1 g by mouth daily.      . furosemide (LASIX) 40 MG tablet  Take 20 mg by mouth daily.       Marland Kitchen glipiZIDE (GLUCOTROL) 2.5 MG 24 hr tablet Take 2.5 mg by mouth daily.       Marland Kitchen levothyroxine (SYNTHROID, LEVOTHROID) 150 MCG tablet Take 150 mcg by mouth daily.       Marland Kitchen losartan (COZAAR) 50 MG tablet TAKE 1 TABLET BY MOUTH DAILY  30 tablet  11  . Magnesium 250 MG TABS Take 1 tablet by mouth.      . oxybutynin (DITROPAN) 5 MG tablet Take 5 mg by mouth as needed.        . rosuvastatin (CRESTOR) 5 MG tablet Take 5 mg by mouth daily.       . vitamin B-12 (CYANOCOBALAMIN) 1000 MCG tablet Take 1,000 mcg by mouth daily.      . vitamin C (ASCORBIC ACID) 500 MG tablet Take 500 mg by mouth daily.      Marland Kitchen warfarin (COUMADIN) 2.5 MG tablet Take 2.5-5 mg by mouth See admin instructions. Pt takes one tablet of 2.5 mg Monday, Tuesday, Wednesday, Thursday, Saturday and Sunday. He takes two tablets on Friday = 5 mg      . Calcium Carbonate-Vitamin D (CALCIUM-VITAMIN D) 500-200 MG-UNIT per tablet Take 1 tablet by mouth daily.      . ferumoxytol (FERAHEME) 510 MG/17ML SOLN Inject 17 mLs (510 mg total) into the vein once.  15.08 mL  0   No facility-administered medications prior to visit.         Objective:   Physical Exam Filed Vitals:   02/24/13 1514  BP: 124/70  Pulse: 45  Temp: 98.2 F (36.8 C)  Gen: Pleasant, elderly somewhat debilitated, in no distress,  normal affect, uses walker  ENT: No lesions,  mouth clear,  Single prominent telangectasia underneath tongue  Neck: No JVD, no TMG, no carotid bruits  Lungs: No use of accessory muscles, no dullness to percussion, clear without rales or rhonchi  Cardiovascular: irregular, heart sounds normal, no murmur or gallops,   Musculoskeletal: No deformities, no cyanosis or clubbing  Neuro: alert, non focal  Skin: Warm, no lesions or rashes, trace pretibial edema.     TTE 01/16/13 --  - Left ventricle: The cavity size was normal. Wall thickness was normal. Systolic function was normal. The estimated ejection  fraction was in the range of 55% to 60%. Wall motion was normal; there were no regional wall motion abnormalities. Doppler  parameters are consistent with restrictive physiology, indicative of decreased left ventricular diastolic compliance and/or increased left atrial pressure. - Aortic valve: Trileaflet; moderately calcified leaflets. There was no stenosis. Mild regurgitation. - Ascending aorta: The visualized portion of the ascending aorta was dilated to 4.1 cm. - Mitral valve: Mildly calcified annulus. There was no evidence for stenosis. Mild regurgitation. - Left atrium: The atrium was severely dilated. - Right ventricle: D-shaped interventricular septum suggestive RV pressure and volume overload. The cavity size was moderately dilated. Systolic function was mildly reduced. - Right atrium: The atrium was severely dilated. - Tricuspid valve: Peak RV-RA gradient: 66mm Hg (S). - Pulmonary arteries: PA peak pressure: 86mm Hg (S). - Systemic veins: IVC dilated to 3.8 cm with no respirophasic variation, suggesting RA pressure 20 mmHg. Impressions:  - Normal LV size and systolic function, EF 55-60%. Restrictive diastolic function. Moderately dilated RV with mildly decreased systolic function. Severe pulmonary hypertension. Dilated IVC suggesting elevated RV filling pressure.      Assessment & Plan:  Pulmonary hypertension PAH identified in 2011, now confirmed progression by doppler on TTE 01/16/13. This is multifactorial >> contributions from his HTN and restricted diastolic dysfxn, A Fib, valvular dz (AI, MR, possible TR post RV infarct in '98?) + untreated OSA (for at least 15 yrs), LE DVT x 2 (reportedly while therapeutic on coumadin) that could result in chronic PE/VTE. Suspect we will need to aggressively address underlying causes. I am suspicious that his PVR may not be as high as the TTE might suggest because he is not hypoxemic on ambulation in the office today.  - will need R  heart cath to better define his PAP's, assess PAOP and PVR. Some of his R heart dilation may be due to R infarct + true TR.  - needs sleep study >> his wife has convinced him to do this - V/Q scan, consider possible coumadin failure ??  - full PFT, although minimal tobacco hx and no evidence obstructive dz on exam.  - auto-immune labs - follow up to review studies.

## 2013-02-24 NOTE — Assessment & Plan Note (Addendum)
PAH identified in 2011, now confirmed progression by doppler on TTE 01/16/13. This is multifactorial >> contributions from his HTN and restricted diastolic dysfxn, A Fib, valvular dz (AI, MR, possible TR post RV infarct in '98?) + untreated OSA (for at least 15 yrs), LE DVT x 2 (reportedly while therapeutic on coumadin) that could result in chronic PE/VTE. Suspect we will need to aggressively address underlying causes. I am suspicious that his PVR may not be as high as the TTE might suggest because he is not hypoxemic on ambulation in the office today.  - will need R heart cath to better define his PAP's, assess PAOP and PVR. Some of his R heart dilation may be due to R infarct + true TR.  - needs sleep study >> his wife has convinced him to do this - V/Q scan, consider possible coumadin failure ??  - full PFT, although minimal tobacco hx and no evidence obstructive dz on exam.  - auto-immune labs - follow up to review studies.

## 2013-02-24 NOTE — Telephone Encounter (Signed)
New Prob    Dr. Antoine Poche took pt off coumadin but Dr. Delton Coombes would like to put pt back on coumadin. Pt wants to know if bloodwork needs to be done before putting him back on coumadin. Would like to speak to nurse.

## 2013-02-24 NOTE — Telephone Encounter (Signed)
Per wife - pt saw Dr Delton Coombes and he would like pt to restart coumadin if possible.  Wife states bleeding in shoulder continues but that will probably be the case since he is "bone on bone"  Wife aware I will discuss with Dr Antoine Poche and call her back with recommendations.  Does not appear pt has had a recent CBC which he may need before restarting.

## 2013-02-24 NOTE — Patient Instructions (Addendum)
We will order a ventilation/perfusion scan Walking oximetry today  We will check blood work today We will schedule a sleep study We will check full pulmonary function testing You should have a Right Heart catherization as recommended by Dr Antoine Poche. This will allow [place] to better assess your pulmonary pressures and the factors that are contributing to the elevated pressure.  Follow with Dr Delton Coombes in 3 - 4 weeks or sooner if you have any problems.

## 2013-02-25 LAB — ANTI-SCLERODERMA ANTIBODY: Scleroderma (Scl-70) (ENA) Antibody, IgG: <1 AU/mL (ref <30)

## 2013-02-25 LAB — ANA: Anti Nuclear Antibody(ANA): NEGATIVE

## 2013-02-25 NOTE — Telephone Encounter (Signed)
Restart warfarin per warfarin clinic.

## 2013-02-25 NOTE — Telephone Encounter (Signed)
Routed to Pam Fleming, RN 

## 2013-02-26 ENCOUNTER — Encounter (HOSPITAL_COMMUNITY)
Admission: RE | Admit: 2013-02-26 | Discharge: 2013-02-26 | Disposition: A | Discharge status: Home / Self Care | Payer: Medicare Other | Source: Ambulatory Visit | Attending: Emergency Medicine | Admitting: Emergency Medicine

## 2013-02-26 ENCOUNTER — Ambulatory Visit (HOSPITAL_COMMUNITY)
Admission: RE | Admit: 2013-02-26 | Discharge: 2013-02-26 | Disposition: A | Discharge status: Home / Self Care | Payer: Medicare Other | Source: Ambulatory Visit | Attending: Emergency Medicine | Admitting: Emergency Medicine

## 2013-02-26 DIAGNOSIS — I272 Pulmonary hypertension, unspecified: Secondary | ICD-10-CM

## 2013-02-26 DIAGNOSIS — I2789 Other specified pulmonary heart diseases: Secondary | ICD-10-CM | POA: Insufficient documentation

## 2013-02-26 MED ORDER — TECHNETIUM TO 99M ALBUMIN AGGREGATED
5.0000 | Freq: Once | INTRAVENOUS | Status: AC | PRN
  Administered 2013-02-26: 5 via INTRAVENOUS

## 2013-02-26 MED ORDER — TECHNETIUM TC 99M DIETHYLENETRIAME-PENTAACETIC ACID: 42.0000 | Freq: Once | INTRAVENOUS | Status: DC | PRN

## 2013-02-26 NOTE — Telephone Encounter (Signed)
Wife aware OK for pt to restart coumadin.  Pt is now going next week to see an oral surgeon about a tooth extraction and wife is going to keep him off the coumadin until after his appt next week.  Instructed wife the patient will need to start coumadin back as soon as the oral surgeon says its OK.  She is also aware he will need PT/INR 5 days after starting coumadin.  She will call back once they are ready.

## 2013-03-02 NOTE — Progress Notes (Signed)
Quick Note:  Spoke with patients wife, made her aware of results as listed below per RB. Verbalized understanding and nothing further needed at this time. ______

## 2013-03-05 ENCOUNTER — Institutional Professional Consult (permissible substitution): Payer: Medicare Other | Admitting: Emergency Medicine

## 2013-03-09 ENCOUNTER — Ambulatory Visit (INDEPENDENT_AMBULATORY_CARE_PROVIDER_SITE_OTHER): Payer: Medicare Other

## 2013-03-09 ENCOUNTER — Telehealth: Payer: Self-pay

## 2013-03-09 DIAGNOSIS — I4891 Unspecified atrial fibrillation: Secondary | ICD-10-CM

## 2013-03-09 LAB — POCT INR: INR: 1.2

## 2013-03-09 NOTE — Telephone Encounter (Signed)
I think that his Target should be lower, with the option to increase in the future if well-tolerated. ? 1.5-2.0 would be a better place to start. Thank you for taking care of him.

## 2013-03-09 NOTE — Telephone Encounter (Signed)
Will note Target INR change in anticoagulation note.

## 2013-03-09 NOTE — Telephone Encounter (Signed)
Pt was seen in Coumadin Clinic today, restarted Coumadin 5 days ago for [facility] and afib per Dr Antoine Poche and Dr Kavin Leech discussion.  Pt was previously on Coumadin but was held by Dr Antoine Poche on 01/15/13 secondary to chronic hemarthrosis and anemia. Please advise if target INR should remain 2.0-3.0 or if should be deceased secondary to increased bleeding risk with persistant chronic hemarthrosis.  Please advise. Thanks

## 2013-03-17 ENCOUNTER — Ambulatory Visit (HOSPITAL_BASED_OUTPATIENT_CLINIC_OR_DEPARTMENT_OTHER): Payer: Medicare Other | Attending: Emergency Medicine

## 2013-03-17 VITALS — Ht 65.0 in | Wt 155.0 lb

## 2013-03-17 DIAGNOSIS — I272 Pulmonary hypertension, unspecified: Secondary | ICD-10-CM

## 2013-03-17 DIAGNOSIS — I10 Essential (primary) hypertension: Secondary | ICD-10-CM | POA: Insufficient documentation

## 2013-03-17 DIAGNOSIS — Z7901 Long term (current) use of anticoagulants: Secondary | ICD-10-CM | POA: Insufficient documentation

## 2013-03-17 DIAGNOSIS — I2510 Atherosclerotic heart disease of native coronary artery without angina pectoris: Secondary | ICD-10-CM | POA: Insufficient documentation

## 2013-03-17 DIAGNOSIS — G4737 Central sleep apnea in conditions classified elsewhere: Secondary | ICD-10-CM | POA: Insufficient documentation

## 2013-03-17 DIAGNOSIS — G4733 Obstructive sleep apnea (adult) (pediatric): Secondary | ICD-10-CM

## 2013-03-17 DIAGNOSIS — I4891 Unspecified atrial fibrillation: Secondary | ICD-10-CM | POA: Insufficient documentation

## 2013-03-17 DIAGNOSIS — E119 Type 2 diabetes mellitus without complications: Secondary | ICD-10-CM | POA: Insufficient documentation

## 2013-03-17 DIAGNOSIS — Z79899 Other long term (current) drug therapy: Secondary | ICD-10-CM | POA: Insufficient documentation

## 2013-03-24 ENCOUNTER — Ambulatory Visit (INDEPENDENT_AMBULATORY_CARE_PROVIDER_SITE_OTHER): Payer: Medicare Other | Admitting: *Deleted

## 2013-03-24 DIAGNOSIS — I4891 Unspecified atrial fibrillation: Secondary | ICD-10-CM

## 2013-03-24 LAB — POCT INR: INR: 1.6

## 2013-03-25 ENCOUNTER — Ambulatory Visit (INDEPENDENT_AMBULATORY_CARE_PROVIDER_SITE_OTHER): Payer: Medicare Other | Admitting: Emergency Medicine

## 2013-03-25 ENCOUNTER — Encounter: Payer: Self-pay | Admitting: Emergency Medicine

## 2013-03-25 VITALS — BP 110/58 | HR 53 | Temp 97.1°F | Ht 67.0 in | Wt 149.0 lb

## 2013-03-25 DIAGNOSIS — I2789 Other specified pulmonary heart diseases: Secondary | ICD-10-CM

## 2013-03-25 DIAGNOSIS — I272 Pulmonary hypertension, unspecified: Secondary | ICD-10-CM

## 2013-03-25 LAB — PULMONARY FUNCTION TEST

## 2013-03-25 NOTE — Assessment & Plan Note (Signed)
Multifactorial secondary PAH -  - L heart issues as discussed - probable OSA - mild AFL on spirometry (? Longstanding fixed asthma in a never smoker) - reassuring V/Q scan (on coumadin)  Will get the final PSG results to address OSA. Will discuss all the findings with Dr Antoine Poche before committing him to a r heart cath. Not clear that targeted PAH meds will be high yield, although a trial might be reasonable. Would consider Adempas if we move in that direction.

## 2013-03-25 NOTE — Progress Notes (Signed)
Subjective:    Patient ID: Douglas George, male    DOB: Mar 03, 1927, 79 y.o.   MRN: [id number]  HPI 79 yo former low exposure smoker (2 pk-yrs), hx DM, OSA (dx 15 yrs ago, not on CPAP),  LE DVT (his wife says these were while he was therapeutic on coumadin), HTN with restrictive diastolic dysfxn, CAD (RCA stent '98, LAD dz), moderate AI and MR, RV dilation and PAH by TTE in 2011 (estimated PASP ) and then confirmed on TTE January 2/14 (estimated PASP 86 mmHg). Followed by Dr Antoine Poche for these issues and A fib on coumadin.  His most recent TTE was performed to evaluate LE edema, has improved since lasix was increased 1/16. He denies any dyspnea, although activity is limited by his joint disease.   ROV 03/25/13 -- follows up for secondary PAH in setting untreated OSA, chronic VTE, L sided heart disease (CAD, diastolic dysfxn, valvular disease). He underwent full PFT today >> Mild (but real) AFL without BD response, normal volumes, decrease DLCO. Last visit dsat to 90% after 2 laps on RA, no overt drop to <88. Sleep study done but not yet available.  V/Q read as normal on 03/02/13 (has been on anti-coagulation).  Auto-immune panel is negative (2/25).  He is wondering about whether he should get the R heart cath.    Review of Systems  Constitutional: Positive for unexpected weight change. Negative for fever.  HENT: Positive for trouble swallowing and dental problem. Negative for ear pain, nosebleeds, congestion, sore throat, rhinorrhea, sneezing, postnasal drip and sinus pressure.   Eyes: Negative for redness and itching.  Respiratory: Positive for cough. Negative for chest tightness, shortness of breath and wheezing.   Cardiovascular: Positive for palpitations. Negative for leg swelling.  Gastrointestinal: Negative for nausea and vomiting.  Genitourinary: Negative for dysuria.  Musculoskeletal: Negative for joint swelling.  Skin: Negative for rash.  Neurological: Negative for headaches.   Hematological: Does not bruise/bleed easily.  Psychiatric/Behavioral: Negative for dysphoric mood. The patient is not nervous/anxious.     V/Q scan 03/02/13 --  Findings: The ventilation scan is normal. No ventilation defects.  The perfusion lung scan is normal. No segmental or subsegmental  defects to suggest pulmonary embolism.  IMPRESSION:  Normal ventilation perfusion lung scan       Objective:   Physical Exam Filed Vitals:   03/25/13 1152  BP: 110/58  Pulse: 53  Temp: 97.1 F (36.2 C)  Gen: Pleasant, elderly somewhat debilitated, in no distress,  normal affect, uses walker  ENT: No lesions,  mouth clear,  Single prominent telangectasia underneath tongue  Neck: No JVD, no TMG, no carotid bruits  Lungs: No use of accessory muscles, no dullness to percussion, clear without rales or rhonchi  Cardiovascular: irregular, heart sounds normal, no murmur or gallops,   Musculoskeletal: No deformities, no cyanosis or clubbing  Neuro: alert, non focal  Skin: Warm, no lesions or rashes, no edema.     TTE 01/16/13 --  - Left ventricle: The cavity size was normal. Wall thickness was normal. Systolic function was normal. The estimated ejection fraction was in the range of 55% to 60%. Wall motion was normal; there were no regional wall motion abnormalities. Doppler parameters are consistent with restrictive physiology, indicative of decreased left ventricular diastolic compliance and/or increased left atrial pressure. - Aortic valve: Trileaflet; moderately calcified leaflets. There was no stenosis. Mild regurgitation. - Ascending aorta: The visualized portion of the ascending aorta was dilated to 4.1 cm. - Mitral valve:  Mildly calcified annulus. There was no evidence for stenosis. Mild regurgitation. - Left atrium: The atrium was severely dilated. - Right ventricle: D-shaped interventricular septum suggestive RV pressure and volume overload. The cavity size was moderately  dilated. Systolic function was mildly reduced. - Right atrium: The atrium was severely dilated. - Tricuspid valve: Peak RV-RA gradient: 66mm Hg (S). - Pulmonary arteries: PA peak pressure: 86mm Hg (S). - Systemic veins: IVC dilated to 3.8 cm with no respirophasic variation, suggesting RA pressure 20 mmHg. Impressions:  - Normal LV size and systolic function, EF 55-60%. Restrictive diastolic function. Moderately dilated RV with mildly decreased systolic function. Severe pulmonary hypertension. Dilated IVC suggesting elevated RV filling pressure.      Assessment & Plan:  Pulmonary hypertension Multifactorial secondary PAH -  - L heart issues as discussed - probable OSA - mild AFL on spirometry (? Longstanding fixed asthma in a never smoker) - reassuring V/Q scan (on coumadin)  Will get the final PSG results to address OSA. Will discuss all the findings with Dr Antoine Poche before committing him to a r heart cath. Not clear that targeted PAH meds will be high yield, although a trial might be reasonable. Would consider Adempas if we move in that direction.

## 2013-03-25 NOTE — Patient Instructions (Addendum)
We will review your sleep study results and discuss the findings and plans with Dr Antoine Poche before committing to a R heart catherization Follow with Dr Delton Coombes in 1 month

## 2013-03-25 NOTE — Progress Notes (Signed)
PFT done today. 

## 2013-03-29 DIAGNOSIS — G4733 Obstructive sleep apnea (adult) (pediatric): Secondary | ICD-10-CM

## 2013-03-29 DIAGNOSIS — G4737 Central sleep apnea in conditions classified elsewhere: Secondary | ICD-10-CM

## 2013-03-31 NOTE — Procedures (Signed)
NAMEDAHLTON, HINDE NO.:  [id number]  MEDICAL RECORD NO.:  [id number]          PATIENT TYPE:  OUT  LOCATION:  SLEEP CENTER                 FACILITY:  [facility]  PHYSICIAN:  Coralyn Helling, MD        DATE OF BIRTH:  04-Jan-1927  DATE OF STUDY:  03/17/2013                           NOCTURNAL POLYSOMNOGRAM  REFERRING PHYSICIAN:  Leslye Peer, MD  FACILITY:  [facility].  INDICATION:  Mr. Kauth is an 79 year old male, who has a history of hypertension,  coronary artery disease, atrial fibrillation, and diabetes.  He also has a history of obstructive sleep apnea, but has not been using CPAP therapy.  He had recent evaluation for pulmonary hypertension.  There is concern that his obstructive sleep apnea could be contributing to his pulmonary hypertension.  He also reports snoring, sleep disruption, and daytime sleepiness.  He is referred to the Sleep Lab for further evaluation of hypersomnia with obstructive sleep apnea.  Height is 5 feet 5 inches.  Weight is 155 pounds.  BMI is 26.  Neck size is 15 inches.  MEDICATIONS:  Warfarin, Altace, doxazosin, vitamin D, oxybutynin, Synthroid, Glucotrol, ferrous sulfate, Crestor, furosemide, fish oil, magnesium, losartan, and Tylenol.  EPWORTH SLEEPINESS SCORE:  13.  SLEEP ARCHITECTURE:  Total recording time was 386 minutes.  Total sleep time was 145 minutes, sleep latency was 68 minutes.  Sleep efficiency was 38%.  The study was notable for lack of slow-wave sleep and REM sleep.  He slept exclusively in the supine position.  He had difficulties with sleep initiation and sleep maintenance due to frequent respiratory events.  RESPIRATORY DATA:  The average respiratory rate was 16.  Loud snoring was noted by the technician.  The overall apnea/hypopnea index was 79. There were 13 central apneic events.  There is 11 mixed respiratory events.  The remainder of the events were obstructive in nature.  OXYGEN DATA:   The baseline oxygenation was 92%.  The oxygen saturation nadir was 83%.  The study was conducted without the use of supplemental oxygen.  CARDIAC CATH:  The average heart rate was 66, and the rhythm strip showed sinus rhythm with occasional PACs and PVCs.  MOVEMENT PARASOMNIA:  The periodic limb movement index was 1.7.  The patient had zero restroom trips.  IMPRESSION:  This study shows evidence for severe sleep apnea with an apnea/hypopnea index of 79 and oxygen saturation nadir of 83%.  He had both obstructive as well as central apneic events.  I would recommend that the patient return to the sleep lab for a titration study.  At that time, it could be determine if he would be better suited for CPAP versus BiPAP therapy plus or minus supplemental oxygen.     Coralyn Helling, MD Diplomat, American Board of Sleep Medicine    VS/MEDQ  D:  03/30/2013 13:56:43  T:  03/31/2013 02:45:37  Job:  [id number]

## 2013-04-01 ENCOUNTER — Ambulatory Visit (INDEPENDENT_AMBULATORY_CARE_PROVIDER_SITE_OTHER): Payer: Medicare Other | Admitting: *Deleted

## 2013-04-01 DIAGNOSIS — R0989 Other specified symptoms and signs involving the circulatory and respiratory systems: Secondary | ICD-10-CM

## 2013-04-01 DIAGNOSIS — I4891 Unspecified atrial fibrillation: Secondary | ICD-10-CM

## 2013-04-06 ENCOUNTER — Telehealth: Payer: Self-pay | Admitting: Emergency Medicine

## 2013-04-06 DIAGNOSIS — I2721 Secondary pulmonary arterial hypertension: Secondary | ICD-10-CM

## 2013-04-06 NOTE — Telephone Encounter (Signed)
I spoke with spouse and she is wanting to know if RB has spoken with Dr. Antoine Poche yet or not? Also would like to know if Dr. Delton Coombes has received the results of the sleep study results. Please advise RB thanks

## 2013-04-07 NOTE — Telephone Encounter (Signed)
Sleep study is now scanned into epic RB please advise, thanks!

## 2013-04-10 NOTE — Telephone Encounter (Signed)
Please let the patient know that his sleep study did show that he has sleep apnea, but that we did not determine this early enough in the night for them to try on CPAP to determine appropriate pressures. For that reason I haven't ordered CPAP yet > he would need to go back to the lab in order to be titrated. Please ask him if he would like to be referred back to the lab, and place the order if he agrees. Thanks.

## 2013-04-10 NOTE — Telephone Encounter (Signed)
Spoke with Misty Stanley, aware of RBs resuts/recs as lsited below. Patient has agreed to do another study. Order placed and nothing further needed at this time.

## 2013-04-20 ENCOUNTER — Ambulatory Visit (INDEPENDENT_AMBULATORY_CARE_PROVIDER_SITE_OTHER): Payer: Medicare Other | Admitting: Pharmacist

## 2013-04-20 ENCOUNTER — Encounter: Payer: Self-pay | Admitting: Cardiology

## 2013-04-20 ENCOUNTER — Ambulatory Visit (INDEPENDENT_AMBULATORY_CARE_PROVIDER_SITE_OTHER): Payer: Medicare Other | Admitting: Cardiology

## 2013-04-20 VITALS — BP 128/56 | HR 70 | Ht 66.0 in | Wt 148.0 lb

## 2013-04-20 DIAGNOSIS — I4891 Unspecified atrial fibrillation: Secondary | ICD-10-CM

## 2013-04-20 DIAGNOSIS — I482 Chronic atrial fibrillation, unspecified: Secondary | ICD-10-CM

## 2013-04-20 DIAGNOSIS — I272 Pulmonary hypertension, unspecified: Secondary | ICD-10-CM

## 2013-04-20 DIAGNOSIS — I259 Chronic ischemic heart disease, unspecified: Secondary | ICD-10-CM

## 2013-04-20 DIAGNOSIS — I2789 Other specified pulmonary heart diseases: Secondary | ICD-10-CM

## 2013-04-20 NOTE — Progress Notes (Signed)
HPI The patient presents for follow of atrial fib and CAD.  At the last appointment he had some increased edema. I ordered an echocardiogram which demonstrated increasing pulmonary pressures. He was seen by Dr. Delton Coombes and this is thought to be multifactorial. Part of the issue is thought to be sleep apnea and he did have evidence of this on a sleep study. However, the plan was to repeat this and consider therapy. Dr. Delton Coombes was not convinced of that right heart catheterization was necessary at this point but would consider this in the future. He felt it unlikely that therapy for pulmonary hypertension would be particularly helpful as this is a secondary phenomenon related to probable fixed lung disease, diastolic dysfunction as well as sleep apnea.  Since I last saw him he has been back on warfarin after careful discussion. He did have again hemarthrosis in both of his shoulders. He's had to have blood withdrawn. He's had chronic anemia. He's not had any new cardiovascular complaints. He doesn't feel the palpitations, presyncope or syncope. He has had no chest pressure, neck or arm discomfort.  He's had no PND or orthopnea however.  His swelling is less than previous.   Allergies  Allergen Reactions  . Codeine     Current Outpatient Prescriptions  Medication Sig Dispense Refill  . ACCU-CHEK AVIVA PLUS test strip daily. Use as directed      . acetaminophen (TYLENOL) 500 MG tablet Take 1,000 mg by mouth 2 (two) times daily.      . Calcium Carb-Cholecalciferol (CALCIUM 1000 + D) 1000-800 MG-UNIT TABS Take 1 tablet by mouth daily.       . Cholecalciferol (VITAMIN D) 1000 UNITS capsule Take 1,000 Units by mouth daily.       Marland Kitchen doxazosin (CARDURA) 8 MG tablet Take 8 mg by mouth at bedtime.      . fish oil-omega-3 fatty acids 1000 MG capsule Take 1 g by mouth daily.      . furosemide (LASIX) 40 MG tablet Take 80 mg by mouth daily.       Marland Kitchen glipiZIDE (GLUCOTROL) 2.5 MG 24 hr tablet Take 2.5 mg by mouth  daily.       Marland Kitchen levothyroxine (SYNTHROID, LEVOTHROID) 150 MCG tablet Take 150 mcg by mouth daily.       Marland Kitchen losartan (COZAAR) 50 MG tablet TAKE 1 TABLET BY MOUTH DAILY  30 tablet  11  . Magnesium 250 MG TABS Take 1 tablet by mouth.      . oxybutynin (DITROPAN) 5 MG tablet Take 5 mg by mouth as needed.        . ramipril (ALTACE) 10 MG capsule Take 10 mg by mouth daily.      . rosuvastatin (CRESTOR) 5 MG tablet Take 5 mg by mouth daily.       . vitamin B-12 (CYANOCOBALAMIN) 1000 MCG tablet Take 1,000 mcg by mouth daily.      . vitamin C (ASCORBIC ACID) 500 MG tablet Take 500 mg by mouth daily.      Marland Kitchen warfarin (COUMADIN) 2.5 MG tablet Take 2.5-5 mg by mouth See admin instructions. Pt takes one tablet of 2.5 mg Monday, Tuesday, Wednesday, Thursday, Saturday and Sunday. He takes two tablets on Friday = 5 mg       No current facility-administered medications for this visit.    Past Medical History  Diagnosis Date  . Edema   . Hearing loss   . SOB (shortness of breath)  WITH WALKING  . Difficulty walking   . Skin change   . Chronic atrial fibrillation   . Alcohol abuse     PAST HISTORY  . Diabetes mellitus   . Hyperlipidemia   . Depression   . Ischemic heart disease     remote stenting of the RCA in 1998 with residual LAD disease of 60 to 70% with negative nuclear in 2011  . Enlarged RV (right ventricle)     per nuclear in 2011  . Pulmonary hypertension     per echo in 2011  . VHD (valvular heart disease)     per echo in 2011 with EF 50 to 55%, moderate AI, moderate MR, LAE, RAE, moderate TR and peak PA pressures up to 74mm  . Arthritis   . Diverticulitis   . H/O blood clots     in L leg  . Sleep apnea   . Sinus trouble   . Heart attack     Past Surgical History  Procedure Laterality Date  . Thyroidectomy    . Left wrist surgery    . Achilles tendon repair      Left  . [place]rapnel      [place]   . Tonsillectomy and adenoidectomy    . Inguinal hernia repair    . Cataract  extraction    . Vasectomy    . Angioplasty      ROS:  As stated in the HPI and negative for all other systems.  PHYSICAL EXAM BP 128/56  Pulse 70  Ht 5\' 6"  (1.676 m)  Wt 148 lb (67.132 kg)  BMI 23.9 kg/m2 PHYSICAL EXAM GEN:  No distress, very frail NECK:  positivejugular venous distentionI had a, waveform within normal limits, carotid upstroke brisk and symmetric, no bruits, no thyromegaly LUNGS:  Clear to auscultation bilaterally BACK:  No CVA tenderness CHEST:  Unremarkable HEART:  S1 and S2 within normal limits, no S3,  no clicks, no rubs, right lower sternal border holosystolic murmur ABD:  Positive bowel sounds normal in frequency in pitch, no rebound, no guarding,  positive midline bruit EXT:  2 plus pulses throughout, moderate edema,  NEURO:  Cranial nerves II through XII grossly intact, motor grossly intact throughout PSYCH:  Cognitively intact, oriented to person place and time  EKG:  Atrial fibrillation rate 42 axis and intervals within normal limits, no acute ST-T wave changes. 04/20/2013  ASSESSMENT AND PLAN  Atrial fibrillation -   [name] has a CHA2DS2 - VASc score of 5 with a risk of stroke of 6.7%  and a HAS - BLED score of 3.with one validation study suggesting a high risk of bleeding.  However, the patient and his family understanding the risk and ongoing issue of hemarthrosis prefer to continue with anticoagulation as the outcome of an embolic stroke is the worst possibility in their minds. We have had this discussion in great detail.  Enlarged RV (right ventricle) -  This is multifactorial as addressed above. He will follow with repeat sleep study and CPAP titration.  Ischemic heart disease -  He has no active angina. He had a stress perfusion study in 2011 the without significant ischemia. No further evaluation is indicated. He will continue with risk reduction.   Edema -  This is much improved with conservative therapy.  Bradycardia - He has  no symptoms related to this. No change in therapy is indicated.

## 2013-04-20 NOTE — Patient Instructions (Addendum)
The current medical regimen is effective;  continue present plan and medications.  Follow up in 4 months with Dr Hochrein.  You will receive a letter in the mail 2 months before you are due.  Please call us when you receive this letter to schedule your follow up appointment.  

## 2013-04-27 ENCOUNTER — Telehealth: Payer: Self-pay | Admitting: Emergency Medicine

## 2013-04-27 ENCOUNTER — Ambulatory Visit (HOSPITAL_BASED_OUTPATIENT_CLINIC_OR_DEPARTMENT_OTHER): Payer: Medicare Other | Attending: Emergency Medicine | Admitting: Radiology

## 2013-04-27 ENCOUNTER — Ambulatory Visit (HOSPITAL_BASED_OUTPATIENT_CLINIC_OR_DEPARTMENT_OTHER): Payer: Medicare Other

## 2013-04-27 VITALS — Ht 66.0 in | Wt 150.0 lb

## 2013-04-27 DIAGNOSIS — R0609 Other forms of dyspnea: Secondary | ICD-10-CM | POA: Insufficient documentation

## 2013-04-27 DIAGNOSIS — I10 Essential (primary) hypertension: Secondary | ICD-10-CM | POA: Insufficient documentation

## 2013-04-27 DIAGNOSIS — R0989 Other specified symptoms and signs involving the circulatory and respiratory systems: Secondary | ICD-10-CM | POA: Insufficient documentation

## 2013-04-27 DIAGNOSIS — G4733 Obstructive sleep apnea (adult) (pediatric): Secondary | ICD-10-CM

## 2013-04-27 DIAGNOSIS — R5381 Other malaise: Secondary | ICD-10-CM | POA: Insufficient documentation

## 2013-04-27 DIAGNOSIS — Z9989 Dependence on other enabling machines and devices: Secondary | ICD-10-CM

## 2013-04-27 DIAGNOSIS — I272 Pulmonary hypertension, unspecified: Secondary | ICD-10-CM

## 2013-04-27 DIAGNOSIS — G4730 Sleep apnea, unspecified: Secondary | ICD-10-CM | POA: Insufficient documentation

## 2013-04-27 NOTE — Telephone Encounter (Signed)
I spoke with Aurther Loft. Pt does need to have a CPAP titration done not split night. i have fixed order and Aurther Loft is aware. Nothing further was needed

## 2013-04-27 NOTE — Telephone Encounter (Signed)
LMTCB

## 2013-04-28 ENCOUNTER — Ambulatory Visit: Payer: Medicare Other | Admitting: Emergency Medicine

## 2013-04-28 NOTE — Telephone Encounter (Signed)
He was supposed to go for a CPAP titration study - I hope this is what was done but am worried that it was ordered for split-nite study (note that he was told that he was coming to lab for "the same test as before"). Wife is correct - I won't have results today. We should reschedule.

## 2013-04-28 NOTE — Telephone Encounter (Signed)
I will r/s this. I corrected order in EPIC. i called Terry (sleep lab) yesterday and advised her it should be a CPAP titration study.   I called spouse and pt is r/s to 05/15/13 at 3:15

## 2013-04-28 NOTE — Telephone Encounter (Signed)
i spoke with spouse. Pt is going to have sleep study last night. Spouse is wanting to know if they should wait for the results before they f/u with RB. They have appt scheduled for 1:30 today. Please advise Dr. Delton Coombes thanks

## 2013-05-12 ENCOUNTER — Ambulatory Visit (INDEPENDENT_AMBULATORY_CARE_PROVIDER_SITE_OTHER): Payer: Medicare Other | Admitting: Internal Medicine

## 2013-05-12 ENCOUNTER — Other Ambulatory Visit (INDEPENDENT_AMBULATORY_CARE_PROVIDER_SITE_OTHER): Payer: Medicare Other

## 2013-05-12 ENCOUNTER — Ambulatory Visit (INDEPENDENT_AMBULATORY_CARE_PROVIDER_SITE_OTHER): Payer: Medicare Other | Admitting: General Practice

## 2013-05-12 ENCOUNTER — Encounter: Payer: Self-pay | Admitting: Internal Medicine

## 2013-05-12 VITALS — BP 102/62 | HR 58 | Temp 97.5°F | Ht 66.0 in | Wt 164.8 lb

## 2013-05-12 DIAGNOSIS — D509 Iron deficiency anemia, unspecified: Secondary | ICD-10-CM

## 2013-05-12 DIAGNOSIS — R1319 Other dysphagia: Secondary | ICD-10-CM

## 2013-05-12 DIAGNOSIS — D696 Thrombocytopenia, unspecified: Secondary | ICD-10-CM

## 2013-05-12 DIAGNOSIS — N390 Urinary tract infection, site not specified: Secondary | ICD-10-CM

## 2013-05-12 DIAGNOSIS — I4891 Unspecified atrial fibrillation: Secondary | ICD-10-CM

## 2013-05-12 LAB — BASIC METABOLIC PANEL
BUN: 39 mg/dL — ABNORMAL HIGH (ref 6–23)
Calcium: 8.6 mg/dL (ref 8.4–10.5)
GFR: 45.63 mL/min — ABNORMAL LOW (ref >60.00)
Glucose, Bld: 89 mg/dL (ref 70–99)

## 2013-05-12 LAB — CBC
HCT: 26.8 % — ABNORMAL LOW (ref 39.0–52.0)
Hemoglobin: 9.1 g/dL — ABNORMAL LOW (ref 13.0–17.0)
MCV: 101.3 fl — ABNORMAL HIGH (ref 78.0–100.0)
Platelets: 130 10*3/uL — ABNORMAL LOW (ref 150.0–400.0)
RBC: 2.64 Mil/uL — ABNORMAL LOW (ref 4.22–5.81)

## 2013-05-12 LAB — URINALYSIS
Bilirubin Urine: NEGATIVE
Ketones, ur: NEGATIVE
Leukocytes, UA: NEGATIVE
Urobilinogen, UA: 0.2 (ref 0.0–1.0)

## 2013-05-12 LAB — IRON AND TIBC
Iron: 39 ug/dL — ABNORMAL LOW (ref 42–165)
TIBC: 241 ug/dL (ref 215–435)
UIBC: 202 ug/dL (ref 125–400)

## 2013-05-12 LAB — FERRITIN: Ferritin: 181.6 ng/mL (ref 22.0–322.0)

## 2013-05-12 NOTE — Progress Notes (Signed)
Subjective:    Patient ID: Douglas George, male    DOB: 03/11/1927, 79 y.o.   MRN: [id number]  HPI  Pt presents to the clinic today for hospital f/u. He went down to [place] for a vacation at [employer]. After the [place] visit to [place], he did have some weakness. 911 was called. He was transferred to the hospital and diagnosed with heat exhaustion, acute on chronic kidney disease, urinary tract infection, thrombocytopenia. He needs all of his labs recheck today to see if his platelet counts is better and if his UTI is gone. He is feeling well since [place] has [facility] [place] [facility] [place] [facility]. He is glad to be up and about instead of stuck in a hospital bed. Additionally today, pt c/o chronic dysphagia. The doctor in the hospital in [place] suggested that the he have a swallow study because he was concerned about risk of aspiration. He would like that set up today.   Review of Systems      Past Medical History  Diagnosis Date  . Edema   . Hearing loss   . SOB (shortness of breath)     WITH WALKING  . Difficulty walking   . Skin change   . Chronic atrial fibrillation   . Alcohol abuse     PAST HISTORY  . Diabetes mellitus   . Hyperlipidemia   . Depression   . Ischemic heart disease     remote stenting of the RCA in 1998 with residual LAD disease of 60 to 70% with negative nuclear in 2011  . Enlarged RV (right ventricle)     per nuclear in 2011  . Pulmonary hypertension     per echo in 2011  . VHD (valvular heart disease)     per echo in 2011 with EF 50 to 55%, moderate AI, moderate MR, LAE, RAE, moderate TR and peak PA pressures up to 74mm  . Arthritis   . Diverticulitis   . H/O blood clots     in L leg  . Sleep apnea   . Sinus trouble   . Heart attack     Current Outpatient Prescriptions  Medication Sig Dispense Refill  . ACCU-CHEK AVIVA PLUS test strip daily. Use as directed      . acetaminophen (TYLENOL) 500 MG tablet Take 1,000 mg by mouth 2 (two) times daily.      .  Calcium Carb-Cholecalciferol (CALCIUM 1000 + D) 1000-800 MG-UNIT TABS Take 1 tablet by mouth daily.       . Cholecalciferol (VITAMIN D) 1000 UNITS capsule Take 1,000 Units by mouth daily.       Marland Kitchen doxazosin (CARDURA) 8 MG tablet Take 8 mg by mouth at bedtime.      . fish oil-omega-3 fatty acids 1000 MG capsule Take 1 g by mouth daily.      . furosemide (LASIX) 40 MG tablet Take 80 mg by mouth daily.       Marland Kitchen glipiZIDE (GLUCOTROL) 2.5 MG 24 hr tablet Take 2.5 mg by mouth daily.       Marland Kitchen levothyroxine (SYNTHROID, LEVOTHROID) 150 MCG tablet Take 150 mcg by mouth daily.       Marland Kitchen losartan (COZAAR) 50 MG tablet TAKE 1 TABLET BY MOUTH DAILY  30 tablet  11  . Magnesium 250 MG TABS Take 1 tablet by mouth.      . oxybutynin (DITROPAN) 5 MG tablet Take 5 mg by mouth as needed.        Marland Kitchen  ramipril (ALTACE) 10 MG capsule Take 10 mg by mouth daily.      . rosuvastatin (CRESTOR) 5 MG tablet Take 5 mg by mouth daily.       . vitamin B-12 (CYANOCOBALAMIN) 1000 MCG tablet Take 1,000 mcg by mouth daily.      . vitamin C (ASCORBIC ACID) 500 MG tablet Take 500 mg by mouth daily.      Marland Kitchen warfarin (COUMADIN) 2.5 MG tablet Take 2.5-5 mg by mouth See admin instructions. Pt takes one tablet of 2.5 mg Monday, Tuesday, Wednesday, Thursday, Saturday and Sunday. He takes two tablets on Friday = 5 mg       No current facility-administered medications for this visit.    Allergies  Allergen Reactions  . Codeine     Family History  Problem Relation Age of Onset  . Heart failure Mother 34  . Diabetes Mother 46  . Heart attack Father 63  . Stroke Father 18  . Aortic aneurysm Father 16    ABDOMINAL  . Cancer Son   . Hepatitis Son     History   Social History  . Marital Status: Married    Spouse Name: N/A    Number of Children: 6  . Years of Education: 16+   Occupational History  . Retired     Runner, broadcasting/film/video   Social History Main Topics  . Smoking status: Former Smoker -- 2.00 packs/day for 1 years    Types:  Cigarettes    Quit date: 12/31/1952  . Smokeless tobacco: Never Used  . Alcohol Use: Yes     Comment: 1 glass wine daily  . Drug Use: No  . Sexually Active: Not Currently   Other Topics Concern  . Not on file   Social History Narrative   No caffeine use   Regular exercise-no     Constitutional: Pt reports fatigue. Denies fever, malaise, headache or abrupt weight changes.  Respiratory: Denies difficulty breathing, shortness of breath, cough or sputum production.   Cardiovascular: Denies chest pain, chest tightness, palpitations or swelling in the hands or feet.   GU: Denies urgency, frequency, pain with urination, burning sensation, blood in urine, odor or discharge. Neurological: Denies dizziness, difficulty with memory, difficulty with speech or problems with balance and coordination.   No other specific complaints in a complete review of systems (except as listed in HPI above).  Objective:   Physical Exam   BP 102/62  Pulse 58  Temp(Src) 97.5 F (36.4 C) (Oral)  Ht 5\' 6"  (1.676 m)  Wt 164 lb 12.8 oz (74.753 kg)  BMI 26.61 kg/m2  SpO2 95% Wt Readings from Last 3 Encounters:  05/12/13 164 lb 12.8 oz (74.753 kg)  04/27/13 150 lb (68.04 kg)  04/20/13 148 lb (67.132 kg)    General: Appears his stated age, well developed, well nourished in NAD. HEENT: Head: normal shape and size; Eyes: sclera white, no icterus, conjunctiva pink, PERRLA and EOMs intact; Ears: Tm's gray and intact, normal light reflex; Nose: mucosa pink and moist, septum midline; Throat/Mouth: Teeth present, mucosa pink and moist, no exudate, lesions or ulcerations noted.  Cardiovascular: Normal rate and rhythm. S1,S2 noted.  No murmur, rubs or gallops noted. No JVD or BLE edema. No carotid bruits noted. Pulmonary/Chest: Normal effort and positive vesicular breath sounds. No respiratory distress. No wheezes, rales or ronchi noted.  Neurological: Alert and oriented. Cranial nerves II-XII intact. Coordination  normal. +DTRs bilaterally.       Assessment & Plan:  Hospital f/u for urinary tract infection, thrombocytopenia, heat exhaustion:  Will recheck urinalysis and urine culture today Will recheck labs for kidney function, H and H, and platelet count  Dysphagia, new onset:  Will refer to GI for possible swallow study  Will f/u as soon as result are back

## 2013-05-13 ENCOUNTER — Other Ambulatory Visit: Payer: Self-pay | Admitting: *Deleted

## 2013-05-13 DIAGNOSIS — D696 Thrombocytopenia, unspecified: Secondary | ICD-10-CM

## 2013-05-15 ENCOUNTER — Ambulatory Visit (INDEPENDENT_AMBULATORY_CARE_PROVIDER_SITE_OTHER): Payer: Medicare Other | Admitting: Emergency Medicine

## 2013-05-15 ENCOUNTER — Encounter: Payer: Self-pay | Admitting: Emergency Medicine

## 2013-05-15 VITALS — BP 122/76 | HR 54 | Ht 63.25 in | Wt 160.2 lb

## 2013-05-15 DIAGNOSIS — I272 Pulmonary hypertension, unspecified: Secondary | ICD-10-CM

## 2013-05-15 DIAGNOSIS — R05 Cough: Secondary | ICD-10-CM

## 2013-05-15 DIAGNOSIS — I2789 Other specified pulmonary heart diseases: Secondary | ICD-10-CM

## 2013-05-15 NOTE — Patient Instructions (Addendum)
We will order your CPAP based on your titration study results Please have swallowing evaluation as planned.  Follow with Dr Delton Coombes in 3 months to review your status on the CPA P, or sooner if you have any problems.

## 2013-05-15 NOTE — Assessment & Plan Note (Addendum)
Likely due to untreated OSA, diastolic dysfxn, L sided disease.  -first and most straightforward step will be to treat his OSA. CPAP titration study hasn't been read. Will review and order his CPAP

## 2013-05-15 NOTE — Assessment & Plan Note (Signed)
?   Whether he is having intermittent aspiration.  - agree with swallowing eval

## 2013-05-15 NOTE — Progress Notes (Signed)
Subjective:    Patient ID: Douglas George, male    DOB: [birth year], 79 y.o.   MRN: [id number]  HPI 79 yo former low exposure smoker (2 pk-yrs), hx DM, OSA (dx 15 yrs ago, not on CPAP),  LE DVT (his wife says these were while he was therapeutic on coumadin), HTN with restrictive diastolic dysfxn, CAD (RCA stent '98, LAD dz), moderate AI and MR, RV dilation and PAH by TTE in 2011 (estimated PASP ) and then confirmed on TTE January 2/14 (estimated PASP 86 mmHg). Followed by Dr Antoine Poche for these issues and A fib on coumadin.  His most recent TTE was performed to evaluate LE edema, has improved since lasix was increased 1/16. He denies any dyspnea, although activity is limited by his joint disease.   ROV 03/25/13 -- follows up for secondary PAH in setting untreated OSA, chronic VTE, L sided heart disease (CAD, diastolic dysfxn, valvular disease). He underwent full PFT today >> Mild (but real) AFL without BD response, normal volumes, decrease DLCO. Last visit dsat to 90% after 2 laps on RA, no overt drop to <88. Sleep study done but not yet available.  V/Q read as normal on 03/02/13 (has been on anti-coagulation).  Auto-immune panel is negative (2/25).  He is wondering about whether he should get the R heart cath.   ROV 05/15/13 -- Follows for his secondary PAH with w/u and eval as above. He has had his CPAP titration study, results not yet available. Since last time he unfortunately was admitted in [facility] for UTI and heat exhaustion.    Review of Systems  Constitutional: Positive for unexpected weight change. Negative for fever.  HENT: Positive for trouble swallowing. Negative for ear pain, nosebleeds, congestion, sore throat, rhinorrhea, sneezing, dental problem, postnasal drip and sinus pressure.   Eyes: Negative for redness and itching.  Respiratory: Positive for cough. Negative for chest tightness, shortness of breath and wheezing.   Cardiovascular: Negative for palpitations and leg swelling.   Gastrointestinal: Negative for nausea and vomiting.  Genitourinary: Negative for dysuria.  Musculoskeletal: Negative for joint swelling.  Skin: Negative for rash.  Neurological: Negative for headaches.  Hematological: Does not bruise/bleed easily.  Psychiatric/Behavioral: Negative for dysphoric mood. The patient is not nervous/anxious.     V/Q scan 03/02/13 --  Findings: The ventilation scan is normal. No ventilation defects.  The perfusion lung scan is normal. No segmental or subsegmental  defects to suggest pulmonary embolism.  IMPRESSION:  Normal ventilation perfusion lung scan       Objective:   Physical Exam Filed Vitals:   05/15/13 1513  BP: 122/76  Pulse: 54  Gen: Pleasant, elderly somewhat debilitated, in no distress,  normal affect, uses walker  ENT: No lesions,  mouth clear,  Single prominent telangectasia underneath tongue  Neck: No JVD, no TMG, no carotid bruits  Lungs: No use of accessory muscles, no dullness to percussion, clear without rales or rhonchi  Cardiovascular: irregular, heart sounds normal, no murmur or gallops,   Musculoskeletal: No deformities, no cyanosis or clubbing  Neuro: alert, non focal  Skin: Warm, no lesions or rashes, no edema.     TTE 01/16/13 --  - Left ventricle: The cavity size was normal. Wall thickness was normal. Systolic function was normal. The estimated ejection fraction was in the range of 55% to 60%. Wall motion was normal; there were no regional wall motion abnormalities. Doppler parameters are consistent with restrictive physiology, indicative of decreased left ventricular diastolic compliance and/or increased left  atrial pressure. - Aortic valve: Trileaflet; moderately calcified leaflets. There was no stenosis. Mild regurgitation. - Ascending aorta: The visualized portion of the ascending aorta was dilated to 4.1 cm. - Mitral valve: Mildly calcified annulus. There was no evidence for stenosis. Mild  regurgitation. - Left atrium: The atrium was severely dilated. - Right ventricle: D-shaped interventricular septum suggestive RV pressure and volume overload. The cavity size was moderately dilated. Systolic function was mildly reduced. - Right atrium: The atrium was severely dilated. - Tricuspid valve: Peak RV-RA gradient: 66mm Hg (S). - Pulmonary arteries: PA peak pressure: 86mm Hg (S). - Systemic veins: IVC dilated to 3.8 cm with no respirophasic variation, suggesting RA pressure 20 mmHg. Impressions:  - Normal LV size and systolic function, EF 55-60%. Restrictive diastolic function. Moderately dilated RV with mildly decreased systolic function. Severe pulmonary hypertension. Dilated IVC suggesting elevated RV filling pressure.      Assessment & Plan:  Pulmonary hypertension Likely due to untreated OSA, diastolic dysfxn, L sided disease.  -first and most straightforward step will be to treat his OSA. CPAP titration study hasn't been read. Will review and order his CPAP  Cough ? Whether he is having intermittent aspiration.  - agree with swallowing eval

## 2013-05-20 ENCOUNTER — Telehealth: Payer: Self-pay | Admitting: Emergency Medicine

## 2013-05-20 DIAGNOSIS — G4730 Sleep apnea, unspecified: Secondary | ICD-10-CM

## 2013-05-20 NOTE — Telephone Encounter (Signed)
I have not atc patient/patients spouse Spoke with Douglas George and made her aware that it does not appear anyone from our office has called Douglas George verbalized understanding, nothing further needed at this time

## 2013-05-20 NOTE — Telephone Encounter (Signed)
I spoke with spouse and she states she received a message form our office to call back. i do not see anything in pt chart. Lauren did you call pt? Please advise thanks

## 2013-05-26 ENCOUNTER — Other Ambulatory Visit (INDEPENDENT_AMBULATORY_CARE_PROVIDER_SITE_OTHER): Payer: Medicare Other

## 2013-05-26 DIAGNOSIS — D696 Thrombocytopenia, unspecified: Secondary | ICD-10-CM

## 2013-05-26 LAB — CBC
MCV: 100.8 fl — ABNORMAL HIGH (ref 78.0–100.0)
RBC: 2.59 Mil/uL — ABNORMAL LOW (ref 4.22–5.81)
RDW: 16.6 % — ABNORMAL HIGH (ref 11.5–14.6)
WBC: 4.9 10*3/uL (ref 4.5–10.5)

## 2013-05-27 ENCOUNTER — Telehealth: Payer: Self-pay | Admitting: Emergency Medicine

## 2013-05-27 DIAGNOSIS — G4733 Obstructive sleep apnea (adult) (pediatric): Secondary | ICD-10-CM

## 2013-05-27 NOTE — Telephone Encounter (Signed)
Called spoke with patient's spouse Advised of RB's recs below regarding the apneas and PAH Pt's spouse verbalized her understanding and denied any questions at this time Will call if anything is needed and she is aware that [facility] will be contacting them about the BiPAP setup Nothing further needed; will sign off.  **the web site www.sleepapnea.org was given to spouse so she may do additional research

## 2013-05-27 NOTE — Telephone Encounter (Signed)
Called spoke with pt's spouse and advised of sleep study recs as stated by RB below.  Spouse verbalized her understanding and requests the BiPAP order be sent to [facility] as pt has used them in the past.  Order placed; staff message sent to [employer].  Dr Delton Coombes, Mrs Tedra Senegal is requesting some further clarification on the BiPAP.  Will this help his PAH?  If yes, how so?  She is also asking if his O2 dropped on the sleep study and for how long.Marland KitchenMarland KitchenI checked but was unable to discern enough to tell spouse with any certainty.  Please advise, thank you.

## 2013-05-27 NOTE — Telephone Encounter (Signed)
LMOM TCB x1 for pt's spouse She will likely request an explanation of "obstructive and central apneas" - this was copied from sleepapnea.org:  The sleeper who suffers from obstructive sleep apnea periodically struggles to breathe but is unable to inhale effectively because his or her airway has collapsed. The sleeper whose problem is central sleep apnea periodically doesn't breathe at all, or breathes so shallowly that oxygen intake is ineffectual. In either type of sleep apnea, the lack of oxygen usually causes the patient to wake up, at least briefly.

## 2013-05-27 NOTE — Telephone Encounter (Signed)
The study was difficult to interpret. Have discussed with Dr Craige Cotta who recommends starting him on auto-titration BiPAP at home with range 5-25 on the IPAP and the EPAP. Please notify the patient and then order this through DME. We will likely perform an ONO on BiPAP at some point in the future to insure that he does not also need oxygen on top of biPAP.

## 2013-05-27 NOTE — Telephone Encounter (Signed)
Yes - his apneas are likely related to his PAH  His oxygen dropped some. The BiPAP will address both obstructive and central apneas (he had both). The BiPAP will probably take care of the problem, but an ONO will be done later to confirm that this is true.   Thanks

## 2013-05-27 NOTE — Telephone Encounter (Signed)
Returning call can be reached at [phone].Douglas George

## 2013-05-27 NOTE — Telephone Encounter (Signed)
Dr Delton Coombes, the pt had CPAP titration done on 05/22/13 Please advise results thanks

## 2013-05-29 ENCOUNTER — Other Ambulatory Visit (HOSPITAL_COMMUNITY): Payer: Self-pay | Admitting: Gastroenterology

## 2013-05-29 DIAGNOSIS — R1310 Dysphagia, unspecified: Secondary | ICD-10-CM

## 2013-05-30 NOTE — Procedures (Signed)
Douglas George, Douglas George NO.:  [id number]  MEDICAL RECORD NO.:  [id number]          PATIENT TYPE:  OUT  LOCATION:  SLEEP CENTER                 FACILITY:  [facility]  PHYSICIAN:  Coralyn Helling, MD        DATE OF BIRTH:  1927/06/08  DATE OF STUDY:  04/27/2013                           NOCTURNAL POLYSOMNOGRAM  REFERRING PHYSICIAN:  Leslye Peer, MD  INDICATION FOR STUDY:  [name] is an 79 year old male who has a history of pulmonary hypertension, coronary artery disease, and hypertension.  He was found to have severe obstructive sleep apnea with a sleep study from March 17, 2013.  His apnea-hypopnea index was 79 with an oxygen saturation nadir of 83%.  He is referred back to sleep lab for a CPAP titration study.  Height is 5 feet 6 inches, weight is 108 pounds.  BMI is 24.  Neck size is 14 inches.  EPWORTH SLEEPINESS SCORE:  14.  MEDICATIONS:  Reviewed in the patient's record.  SLEEP ARCHITECTURE:  Total recording time was 399 minutes.  Total sleep time was 200 minutes, sleep efficiency was 50%, sleep latency was 10 minutes.  REM latency was 310 minutes.  The study was notable for lack of slow-wave sleep and he slept exclusively in the supine position.  RESPIRATORY DATA:  The average respiratory rate was 14.  The patient was started on CPAP of 5 and increased to 11 cm water.  He continued to have respiratory events.  He is therefore transitioned to BiPAP starting at 11 or 8 and increased to 21/16.  However, in spite of these adjustments, he continued to have difficulties with sleep initiation and sleep maintenance as well as respiratory events.  OXYGEN DATA:  The baseline oxygenation was 99%.  The oxygen saturation nadir was 88%.  The study was conducted without the use of oxygen.  CARDIAC DATA:  The average heart rate 58 and the rhythm strip showed sinus rhythm with occasional PVCs.  MOVEMENT-PARASOMNIA:  The periodic limb movement index was 16.8.   The patient had 2 restroom trips.  IMPRESSIONS-RECOMMENDATIONS:  This was a suboptimal titration study. The patient had difficulties with both trials of CPAP and BiPAP.  There is increase in his periodic limb movement index and clinical correlation will be necessary to determine the significance of this.  He developed more frequent central apneic events with CPAP.  This was only partial improved with transition to BiPAP.  Options at this time are to have him return to sleep lab for full night titration with BiPAP or adaptive servo ventilator.  Alternatively, he could be tried on auto BiPAP with pressure range of 5 to 25 cm H2O, and assess his download for improvement in his sleep disordered breathing.      Coralyn Helling, MD Diplomat, American Board of Sleep Medicine    VS/MEDQ  D:  05/29/2013 16:51:29  T:  05/30/2013 01:29:14  Job:  [id number]

## 2013-06-02 ENCOUNTER — Telehealth: Payer: Self-pay | Admitting: *Deleted

## 2013-06-02 NOTE — Telephone Encounter (Signed)
Pt's spouse calling regarding results from last week's blood work and to get an rx for antibiotic before dental procedure.

## 2013-06-02 NOTE — Telephone Encounter (Signed)
Pt's spouse informed of NP's advisement. Pt is not taking ASA-he is on coumadin.

## 2013-06-02 NOTE — Telephone Encounter (Signed)
He made need to schedule with Dr. Lucianne Muss. Procrit may help. He is not taking any aspirin is he?

## 2013-06-02 NOTE — Telephone Encounter (Signed)
Pt's spouse informed of NP's advisement. She wants to know if there is anything they can do to bring platelet count up and if he needs to schedule an appointment with Dr. Lucianne Muss for Procrit shot (if this will help).

## 2013-06-02 NOTE — Telephone Encounter (Signed)
Blood count shows that platelets have dropped to 107 (normal> 150). His dentist may not want to do the procedure with a platelet count this low. I would have her discuss that with him first. If he is okay doing the procedure, then I will be happy to prescribe him an antibiotic for the procedure. The rest of the blood count in unchanged.

## 2013-06-03 ENCOUNTER — Other Ambulatory Visit (INDEPENDENT_AMBULATORY_CARE_PROVIDER_SITE_OTHER): Payer: Medicare Other

## 2013-06-03 ENCOUNTER — Other Ambulatory Visit (HOSPITAL_COMMUNITY): Payer: Medicare Other

## 2013-06-03 ENCOUNTER — Telehealth: Payer: Self-pay | Admitting: Oncology

## 2013-06-03 ENCOUNTER — Ambulatory Visit (HOSPITAL_COMMUNITY): Admission: RE | Admit: 2013-06-03 | Payer: Medicare Other | Source: Ambulatory Visit

## 2013-06-03 ENCOUNTER — Ambulatory Visit (INDEPENDENT_AMBULATORY_CARE_PROVIDER_SITE_OTHER): Payer: Medicare Other | Admitting: Internal Medicine

## 2013-06-03 ENCOUNTER — Encounter: Payer: Self-pay | Admitting: Internal Medicine

## 2013-06-03 VITALS — BP 92/42 | HR 56 | Temp 98.0°F | Ht 63.25 in | Wt 151.4 lb

## 2013-06-03 DIAGNOSIS — D696 Thrombocytopenia, unspecified: Secondary | ICD-10-CM

## 2013-06-03 DIAGNOSIS — J029 Acute pharyngitis, unspecified: Secondary | ICD-10-CM

## 2013-06-03 LAB — HEPATIC FUNCTION PANEL
ALT: 16 U/L (ref 0–53)
Bilirubin, Direct: 0.3 mg/dL (ref 0.0–0.3)
Total Bilirubin: 1.5 mg/dL — ABNORMAL HIGH (ref 0.3–1.2)

## 2013-06-03 MED ORDER — AMOXICILLIN 500 MG PO CAPS: 500.0000 mg | ORAL_CAPSULE | Freq: Three times a day (TID) | ORAL | Status: DC

## 2013-06-03 NOTE — Telephone Encounter (Signed)
S/W PT(WIFE) IN RE NP APPT 06/20 @ 3 W/DR. HA REFERRIGN DR. Rene Kocher BAITY DX- THROMBOCYTOPENIA WELCOME PACKET MAILED

## 2013-06-03 NOTE — Progress Notes (Signed)
Subjective:    Patient ID: Douglas George, male    DOB: 1927/05/27, 79 y.o.   MRN: [id number]  HPI  Pt presents to the clinic today with c/o a fever. This started yesterday. He also c/o a sore throat. He had a fever up to 99.9. He reports pain with swallowing. He is unsure if he has had sick contacts. He is not aware of any food getting caught in his throat or drinking or eating anything too hot that may have burned his mouth or throat. He also has some concerns about his low platelet count. It is now down to 97. He is supposed to have some dental work done but is unable to because of his platelet count. He is on coumadin. He has not had any bleeding episodes. He has ben to a hematologist but it has been more than 10 years ago.  Review of Systems      Past Medical History  Diagnosis Date  . Edema   . Hearing loss   . SOB (shortness of breath)     WITH WALKING  . Difficulty walking   . Skin change   . Chronic atrial fibrillation   . Alcohol abuse     PAST HISTORY  . Diabetes mellitus   . Hyperlipidemia   . Depression   . Ischemic heart disease     remote stenting of the RCA in 1998 with residual LAD disease of 60 to 70% with negative nuclear in 2011  . Enlarged RV (right ventricle)     per nuclear in 2011  . Pulmonary hypertension     per echo in 2011  . VHD (valvular heart disease)     per echo in 2011 with EF 50 to 55%, moderate AI, moderate MR, LAE, RAE, moderate TR and peak PA pressures up to 74mm  . Arthritis   . Diverticulitis   . H/O blood clots     in L leg  . Sleep apnea   . Sinus trouble   . Heart attack     Current Outpatient Prescriptions  Medication Sig Dispense Refill  . ACCU-CHEK AVIVA PLUS test strip daily. Use as directed      . acetaminophen (TYLENOL) 500 MG tablet Take 1,000 mg by mouth 2 (two) times daily.      . Calcium Carb-Cholecalciferol (CALCIUM 1000 + D) 1000-800 MG-UNIT TABS Take 1 tablet by mouth daily.       . cephALEXin (KEFLEX) 500 MG  capsule Take 500 mg by mouth 4 (four) times daily.      . Cholecalciferol (VITAMIN D) 1000 UNITS capsule Take 1,000 Units by mouth daily.       Marland Kitchen doxazosin (CARDURA) 8 MG tablet Take 8 mg by mouth at bedtime.      . fish oil-omega-3 fatty acids 1000 MG capsule Take 1 g by mouth daily.      . furosemide (LASIX) 40 MG tablet Take 80 mg by mouth daily.       Marland Kitchen glipiZIDE (GLUCOTROL) 2.5 MG 24 hr tablet Take 2.5 mg by mouth daily.       Marland Kitchen levothyroxine (SYNTHROID, LEVOTHROID) 150 MCG tablet Take 150 mcg by mouth daily.       Marland Kitchen losartan (COZAAR) 50 MG tablet TAKE 1 TABLET BY MOUTH DAILY  30 tablet  11  . oxybutynin (DITROPAN) 5 MG tablet Take 5 mg by mouth as needed.        . ramipril (ALTACE) 10 MG capsule Take 10 mg  by mouth daily.      . rosuvastatin (CRESTOR) 5 MG tablet Take 10 mg by mouth daily.       . vitamin B-12 (CYANOCOBALAMIN) 1000 MCG tablet Take 1,000 mcg by mouth daily.      . vitamin C (ASCORBIC ACID) 500 MG tablet Take 500 mg by mouth daily.      Marland Kitchen warfarin (COUMADIN) 2.5 MG tablet Take 2.5-5 mg by mouth See admin instructions. Pt takes one tablet of 2.5 mg Monday, Tuesday, Wednesday, Thursday, Saturday and Sunday. He takes two tablets on Friday = 5 mg       No current facility-administered medications for this visit.    Allergies  Allergen Reactions  . Codeine     Family History  Problem Relation Age of Onset  . Heart failure Mother 14  . Diabetes Mother 71  . Heart attack Father 51  . Stroke Father 60  . Aortic aneurysm Father 24    ABDOMINAL  . Cancer Son   . Hepatitis Son     History   Social History  . Marital Status: Married    Spouse Name: N/A    Number of Children: 6  . Years of Education: 16+   Occupational History  . Retired     Runner, broadcasting/film/video   Social History Main Topics  . Smoking status: Former Smoker -- 2.00 packs/day for 1 years    Types: Cigarettes    Quit date: 12/31/1952  . Smokeless tobacco: Never Used  . Alcohol Use: Yes     Comment: 1  glass wine daily  . Drug Use: No  . Sexually Active: Not Currently   Other Topics Concern  . Not on file   Social History Narrative   No caffeine use   Regular exercise-no    Constitutional: Pt reports fever.  Denies  malaise, fatigue, headache or abrupt weight changes.  HEENT: Pt reports sore throat. Denies eye pain, eye redness, ear pain, ringing in the ears, wax buildup, runny nose, nasal congestion, bloody nose. Skin: Denies redness, rashes, lesions or ulcercations.  Neurological: Denies dizziness, difficulty with memory, difficulty with speech or problems with balance and coordination.   No other specific complaints in a complete review of systems (except as listed in HPI above).  Objective:   Physical Exam  BP 92/42  Pulse 56  Temp(Src) 98 F (36.7 C) (Oral)  Ht 5' 3.25" (1.607 m)  Wt 151 lb 6.4 oz (68.675 kg)  BMI 26.59 kg/m2  SpO2 93% Wt Readings from Last 3 Encounters:  06/03/13 151 lb 6.4 oz (68.675 kg)  05/15/13 160 lb 3.2 oz (72.666 kg)  05/12/13 164 lb 12.8 oz (74.753 kg)    General: Chronically ill appearing, well developed, well nourished in NAD. Skin: Warm, dry and intact. No rashes, lesions or ulcerations noted. HEENT: Head: normal shape and size; Eyes: sclera white, no icterus, conjunctiva pink, PERRLA and EOMs intact; Ears: Tm's gray and intact, normal light reflex; Nose: mucosa pink and moist, septum midline; Throat/Mouth: Teeth present, mucosa erythematousand moist, no exudate, lesions or ulcerations noted.  Neck: Normal range of motion. Neck supple, trachea midline. No massses, lumps or thyromegaly present. Tonsillar lymphadenopathy on the left side. Cardiovascular: Normal rate and rhythm. S1,S2 noted.  No murmur, rubs or gallops noted. No JVD or BLE edema.. Pulmonary/Chest: Normal effort and positive vesicular breath sounds. No respiratory distress. No wheezes, rales or ronchi noted.  Musculoskeletal: Normal range of motion. No signs of joint swelling.  No difficulty with  gait.  Neurological: Alert and oriented. Cranial nerves II-XII intact. Coordination normal. +DTRs bilaterally.  BMET    Component Value Date/Time   NA 136 05/12/2013 1508   K 4.8 05/12/2013 1508   CL 109 05/12/2013 1508   CO2 22 05/12/2013 1508   GLUCOSE 89 05/12/2013 1508   BUN 39* 05/12/2013 1508   CREATININE 1.6* 05/12/2013 1508   CALCIUM 8.6 05/12/2013 1508    Lipid Panel  No results found for this basename: chol, trig, hdl, cholhdl, vldl, ldlcalc    CBC    Component Value Date/Time   WBC 4.9 05/26/2013 1056   WBC 5.7 07/12/2006 0840   RBC 2.59 aL* 05/26/2013 1056   RBC 2.95* 07/12/2006 0840   HGB 8.9 Repeated and verified X2.* 05/26/2013 1056   HGB 10.6* 07/12/2006 0840   HCT 26.1 aL* 05/26/2013 1056   HCT 30.8* 07/12/2006 0840   PLT 107.0* 05/26/2013 1056   PLT 207 07/12/2006 0840   MCV 100.8* 05/26/2013 1056   MCV 104.5* 07/12/2006 0840   MCH 35.9* 07/12/2006 0840   MCHC 34.1 05/26/2013 1056   MCHC 34.3 07/12/2006 0840   RDW 16.6* 05/26/2013 1056   RDW 14.9* 07/12/2006 0840   LYMPHSABS 0.8 09/17/2012 1220   LYMPHSABS 0.7* 07/12/2006 0840   MONOABS 0.8 09/17/2012 1220   MONOABS 0.7 07/12/2006 0840   EOSABS 0.2 09/17/2012 1220   EOSABS 0.1 07/12/2006 0840   BASOSABS 0.0 09/17/2012 1220   BASOSABS 0.0 07/12/2006 0840    Hgb A1C No results found for this basename: HGBA1C         Assessment & Plan:   Acute pharyngitis, new onset:  Given recent history with infection, will treat with Amoxicillin x 10 days Salt water gargles for sore throat Tylenol as needed  Thrombocytopenia, chronic:  Will check LFT's today Will refer to hematology for further evaluation

## 2013-06-03 NOTE — Patient Instructions (Signed)

## 2013-06-04 ENCOUNTER — Telehealth: Payer: Self-pay | Admitting: Cardiology

## 2013-06-04 MED ORDER — RAMIPRIL 5 MG PO CAPS: 5.0000 mg | ORAL_CAPSULE | Freq: Every day | ORAL | Status: DC

## 2013-06-04 NOTE — Telephone Encounter (Signed)
Wife aware to decrease ramipril to 5 mg a day.  rx will be sent into [facility] as requested.

## 2013-06-04 NOTE — Telephone Encounter (Signed)
Per wife pt BP has been low for the past couple of weeks. He has seen his PCP and Dr Lucianne Muss and no medication adjustments have been made. Today his BP has been 92/40 and 100/40.  He is not having any s/s of hypotension.  He has had a sore throat and has had a decreased po intake.  encouraged wife to try to have pt increase fluids.  Aware I will have Dr Antoine Poche review this information and will call her back if any changes in medications are needed.

## 2013-06-04 NOTE — Telephone Encounter (Signed)
New Problem  Wife states pt BP has been running low for the past few weeks. She said it was 100/38 when she took it this morning.  She said another time it was 90/38.  She is wondering if he may need to come off a few of his BP medications.

## 2013-06-04 NOTE — Telephone Encounter (Signed)
Decrease the Altace to 5 mg daily.

## 2013-06-05 ENCOUNTER — Other Ambulatory Visit: Payer: Self-pay | Admitting: Oncology

## 2013-06-05 ENCOUNTER — Telehealth: Payer: Self-pay

## 2013-06-05 ENCOUNTER — Encounter: Payer: Self-pay | Admitting: Oncology

## 2013-06-05 DIAGNOSIS — D539 Nutritional anemia, unspecified: Secondary | ICD-10-CM | POA: Insufficient documentation

## 2013-06-05 DIAGNOSIS — D696 Thrombocytopenia, unspecified: Secondary | ICD-10-CM

## 2013-06-05 NOTE — Telephone Encounter (Signed)
Pt informed of results.

## 2013-06-05 NOTE — Telephone Encounter (Signed)
Pt's wife [name] calls for lab results.

## 2013-06-09 ENCOUNTER — Ambulatory Visit (INDEPENDENT_AMBULATORY_CARE_PROVIDER_SITE_OTHER): Payer: Medicare Other | Admitting: Family Medicine

## 2013-06-09 DIAGNOSIS — I4891 Unspecified atrial fibrillation: Secondary | ICD-10-CM

## 2013-06-10 ENCOUNTER — Ambulatory Visit (HOSPITAL_COMMUNITY)
Admission: RE | Admit: 2013-06-10 | Discharge: 2013-06-10 | Disposition: A | Discharge status: Home / Self Care | Payer: Medicare Other | Source: Ambulatory Visit | Attending: Gastroenterology | Admitting: Gastroenterology

## 2013-06-10 DIAGNOSIS — K117 Disturbances of salivary secretion: Secondary | ICD-10-CM | POA: Insufficient documentation

## 2013-06-10 DIAGNOSIS — R1310 Dysphagia, unspecified: Secondary | ICD-10-CM

## 2013-06-10 DIAGNOSIS — R6889 Other general symptoms and signs: Secondary | ICD-10-CM | POA: Insufficient documentation

## 2013-06-10 NOTE — Procedures (Addendum)
Objective Swallowing Evaluation: Modified Barium Swallowing Study  Patient Details  Name: Douglas George MRN: [id number] Date of Birth: 01-Dec-1927  Today's Date: 06/10/2013 Time: 8469-6295 SLP Time Calculation (min): 23 min  Past Medical History:  Past Medical History  Diagnosis Date  . Edema   . Hearing loss   . SOB (shortness of breath)     WITH WALKING  . Difficulty walking   . Skin change   . Chronic atrial fibrillation   . Alcohol abuse     PAST HISTORY  . Diabetes mellitus   . Hyperlipidemia   . Depression   . Ischemic heart disease     remote stenting of the RCA in 1998 with residual LAD disease of 60 to 70% with negative nuclear in 2011  . Enlarged RV (right ventricle)     per nuclear in 2011  . Pulmonary hypertension     per echo in 2011  . VHD (valvular heart disease)     per echo in 2011 with EF 50 to 55%, moderate AI, moderate MR, LAE, RAE, moderate TR and peak PA pressures up to 74mm  . Arthritis   . Diverticulitis   . H/O blood clots     in L leg  . Sleep apnea   . Sinus trouble   . Heart attack   . Unspecified deficiency anemia   . Thrombocytopenia    Past Surgical History:  Past Surgical History  Procedure Laterality Date  . Thyroidectomy    . Left wrist surgery    . Achilles tendon repair      Left  . Shrapnel      [place]   . Tonsillectomy and adenoidectomy    . Inguinal hernia repair    . Cataract extraction    . Vasectomy    . Angioplasty     HPI:  79 yo male referred by Dr Evette Cristal for MBS (followed by esophagram) due to concerns that pt is aspirating.  Pt reports recent issues with "violent cough" off and on for 2 weeks , he states he received antibiotics and issue resolved.  Pt PMH + for DM2, CAD, dyslipidemia, HTN, sleep apnea for which pt uses CPAP, hearing loss, dysphagia, depression, sinus allergies, heart attack, hypothyroidism, alcohol abuse in the past, afib, recurrent UTIs, hiatal hernia, diverticulosis, ischemic heart disease, anemia,  blood clots - left leg, diverticulosis, spinal stenosis, former smoker.  Pt denies h/o cva however CT head 07/05/2012 showed right frontal encephalomalacia compatible with previous infarct.  Xerostomia reported and oral cavity was overtly dry, reddened slightly.         Assessment / Plan / Recommendation Clinical Impression  Dysphagia Diagnosis: Mild pharyngeal phase dysphagia  Clinical impression: Pt presents with mild pharyngeal dysphagia due to epiglottis deflection compromise from mechanical obstruction as it contacts cervical spine thus allowing vallecular residuals to accumulate. Decreased laryngeal elevation also noted.   Pt does not always sense pharyngeal stasis but he did have occasional reflexive dry swallow.  Cued dry swallows also aided clearance.  Dry swallows are difficult for pt due to xerostomia.    Pt compensates for epiglottic deflection deficit by extending head upward (strategy he created on his own) and this appeared to improve epiglottic deflection.  Head turn left attempted without improvement.  Pt did tracely aspirate *just below vocal folds* with thin via sequential sips SILENTLY, however cued cough effectively cleared.  Small single cup sips were tolerated without aspiration.  Trace penetration also cleared with cued cough.  Of note, pt did NOT reflexively cough during evaluation, do suspect he intermittently aspirates with sensation with large amount - informed him of this suspicion.   Suspect pt has compensated for mechanical deficit and his swallow may become more compromised if he becomes acutely ill.  As pt denies pulmonary infections nor unintentional weight loss, rec continue diet (consider soft) with adherence to aspiration precautions to mitigate dysphagia.  Skilled intervention included providing precautions, exercises *lingual press, xerostomia tips, and diet modifications.    Did not provide Shaker head lift exercise due to pt's known arthritis, neck pain.     Thanks for this referral.      Treatment Recommendation    n/a   Diet Recommendation Dysphagia 3 (Mechanical Soft);Thin liquid   Liquid Administration via: Cup;No straw Medication Administration: Whole meds with puree (start and follow with liquids) Supervision: Patient able to self feed Compensations: Slow rate;Small sips/bites;Follow solids with liquid;Multiple dry swallows after each bite/sip (intermittent cough, start meall with liquid d/t xerostomia) Postural Changes and/or Swallow Maneuvers: Seated upright 90 degrees;Upright 30-60 min after meal    Other  Recommendations Oral Care Recommendations: Oral care QID   Follow Up Recommendations  None           SLP Swallow Goals     General Date of Onset: 06/10/13 HPI: 79 yo male referred by Dr Evette Cristal for MBS (followed by esophagram) due to concerns that pt is aspirating.  Pt reports recent issues with "violent cough" off and on for 2 weeks , he states he received antibiotics and issue resolved.  Pt PMH + for DM2, CAD, dyslipidemia, HTN, sleep apnea for which pt uses CPAP, hearing loss, dysphagia, depression, sinus allergies, heart attack, hypothyroidism, alcohol abuse in the past, afib, recurrent UTIs, hiatal hernia, diverticulosis, ischemic heart disease, anemia, blood clots - left leg, diverticulosis, spinal stenosis, former smoker.  Pt denies h/o cva however CT head 07/05/2012 showed right frontal encephalomalacia compatible with previous infarct.  Xerostomia    Type of Study: Modified Barium Swallowing Study Previous Swallow Assessment: UGI 03/31/2007 small sliding hiatal hernia, GER spontaneous, moderate dysmotility, small narrowing above hiatal hernia most likely peptic stricture, tiny Zenker's, aspiration of small amount of thin barium Diet Prior to this Study: Regular;Thin liquids Temperature Spikes Noted: No Respiratory Status: Room air History of Recent Intubation: No Behavior/Cognition: Alert;Cooperative;Pleasant mood Oral  Cavity - Dentition: Adequate natural dentition (partial upper) Oral Motor / Sensory Function: Within functional limits Self-Feeding Abilities: Able to feed self Patient Positioning: Upright in bed Baseline Vocal Quality: Clear Volitional Cough: Strong Volitional Swallow: Able to elicit Anatomy: Within functional limits Pharyngeal Secretions: Not observed secondary MBS    Reason for Referral   concern for aspiration  Oral Phase Oral Preparation/Oral Phase Oral Phase: WFL except for xerostomia   Pharyngeal Phase Pharyngeal Phase Pharyngeal Phase: Impaired Pharyngeal - Nectar Pharyngeal - Nectar Cup: Reduced epiglottic inversion;Reduced anterior laryngeal mobility;Pharyngeal residue - valleculae;Reduced laryngeal elevation Pharyngeal - Thin Pharyngeal - Thin Cup: Pharyngeal residue - pyriform sinuses;Pharyngeal residue - valleculae;Reduced epiglottic inversion;Reduced laryngeal elevation;Reduced anterior laryngeal mobility Pharyngeal - Thin Straw: Trace aspiration;Penetration/Aspiration during swallow;Reduced laryngeal elevation;Reduced anterior laryngeal mobility;Reduced epiglottic inversion;Pharyngeal residue - valleculae Penetration/Aspiration details (thin straw): Material enters airway, passes BELOW cords without attempt by patient to eject out (silent aspiration) Pharyngeal - Solids Pharyngeal - Puree: Reduced anterior laryngeal mobility;Reduced epiglottic inversion;Reduced laryngeal elevation;Pharyngeal residue - valleculae Pharyngeal - Regular: Pharyngeal residue - valleculae;Reduced epiglottic inversion;Reduced anterior laryngeal mobility;Reduced laryngeal elevation Pharyngeal Phase - Comment Pharyngeal Comment: head  turn left not helpful to decrease stasis, reflexive and cued dry swallows as well as liquid swallows aid pharyngeal clearance, head elevation appeared to aid epiglottic deflection and improve pharyngeal clearance  Cervical Esophageal Phase    GO    Cervical  Esophageal Phase - Thin Thin Cup: Prominent cricopharyngeal segment Thin Straw: Prominent cricopharyngeal segment Cervical Esophageal Phase - Comment Cervical Esophageal Comment: prominent CP appeared twice during testing, defer to radiologist for findings on esophagram    Functional Assessment Tool Used: MBS, clinical judgement Functional Limitations: Swallowing Swallow Current Status (Z6109): At least 20 percent but less than 40 percent impaired, limited or restricted Swallow Goal Status [phone]): At least 20 percent but less than 40 percent impaired, limited or restricted Swallow Discharge Status [phone]): At least 20 percent but less than 40 percent impaired, limited or restricted    Donavan Burnet, MS [facility] SLP [phone]

## 2013-06-19 ENCOUNTER — Ambulatory Visit (HOSPITAL_BASED_OUTPATIENT_CLINIC_OR_DEPARTMENT_OTHER): Payer: Medicare Other | Admitting: Oncology

## 2013-06-19 ENCOUNTER — Other Ambulatory Visit (HOSPITAL_BASED_OUTPATIENT_CLINIC_OR_DEPARTMENT_OTHER): Payer: Medicare Other

## 2013-06-19 ENCOUNTER — Encounter: Payer: Self-pay | Admitting: Oncology

## 2013-06-19 ENCOUNTER — Telehealth: Payer: Self-pay | Admitting: Oncology

## 2013-06-19 ENCOUNTER — Ambulatory Visit: Payer: Medicare Other

## 2013-06-19 VITALS — BP 134/57 | HR 66 | Temp 98.2°F | Resp 17 | Ht 63.25 in | Wt 151.9 lb

## 2013-06-19 DIAGNOSIS — E119 Type 2 diabetes mellitus without complications: Secondary | ICD-10-CM

## 2013-06-19 DIAGNOSIS — E785 Hyperlipidemia, unspecified: Secondary | ICD-10-CM

## 2013-06-19 DIAGNOSIS — N183 Chronic kidney disease, stage 3 unspecified: Secondary | ICD-10-CM

## 2013-06-19 DIAGNOSIS — N039 Chronic nephritic syndrome with unspecified morphologic changes: Secondary | ICD-10-CM

## 2013-06-19 DIAGNOSIS — D539 Nutritional anemia, unspecified: Secondary | ICD-10-CM

## 2013-06-19 DIAGNOSIS — D631 Anemia in chronic kidney disease: Secondary | ICD-10-CM

## 2013-06-19 DIAGNOSIS — D696 Thrombocytopenia, unspecified: Secondary | ICD-10-CM

## 2013-06-19 LAB — MORPHOLOGY: PLT EST: ADEQUATE

## 2013-06-19 LAB — CBC WITH DIFFERENTIAL/PLATELET
Basophils Absolute: 0.1 10*3/uL (ref 0.0–0.1)
Eosinophils Absolute: 0.3 10*3/uL (ref 0.0–0.5)
HCT: 30.1 % — ABNORMAL LOW (ref 38.4–49.9)
LYMPH%: 19.7 % (ref 14.0–49.0)
MCV: 104.5 fL — ABNORMAL HIGH (ref 79.3–98.0)
MONO#: 0.7 10*3/uL (ref 0.1–0.9)
MONO%: 14 % (ref 0.0–14.0)
NEUT#: 3.1 10*3/uL (ref 1.5–6.5)
NEUT%: 58.7 % (ref 39.0–75.0)
Platelets: 150 10*3/uL (ref 140–400)
RBC: 2.88 10*6/uL — ABNORMAL LOW (ref 4.20–5.82)
WBC: 5.3 10*3/uL (ref 4.0–10.3)
nRBC: 0 % (ref 0–0)

## 2013-06-19 LAB — COMPREHENSIVE METABOLIC PANEL (CC13)
Albumin: 3.7 g/dL (ref 3.5–5.0)
Alkaline Phosphatase: 120 U/L (ref 40–150)
BUN: 59.7 mg/dL — ABNORMAL HIGH (ref 7.0–26.0)
Creatinine: 1.8 mg/dL — ABNORMAL HIGH (ref 0.7–1.3)
Glucose: 90 mg/dl (ref 70–99)
Potassium: 4.7 mEq/L (ref 3.5–5.1)
Total Bilirubin: 0.85 mg/dL (ref 0.20–1.20)

## 2013-06-19 LAB — CHCC SMEAR

## 2013-06-19 MED ORDER — DARBEPOETIN ALFA-POLYSORBATE 150 MCG/0.3ML IJ SOLN
150.0000 ug | Freq: Once | INTRAMUSCULAR | Status: AC
  Administered 2013-06-19: 150 ug via SUBCUTANEOUS
  Filled 2013-06-19: qty 0.3

## 2013-06-19 NOTE — Telephone Encounter (Signed)
gave pt appt for July , august and  September 2014 labs injection and MD

## 2013-06-19 NOTE — Progress Notes (Signed)
No POA/living will. He wants all communication via email on file. Checked in new patient with no financial issues.

## 2013-06-21 NOTE — Progress Notes (Signed)
[facility]  [phone] [phone]     INITIAL HEMATOLOGY CONSULTATION    Referral MD:  [name], NP.  Reason for Referral: anemia.     HPI:  [name] is an 79 year old man with hx of DM type II, hyperlipidemia, CAD, CKD.  He has been having anemia for the last few years. He had diagnostic bone marrow biopsy in 05/2006 which was non remarkable.  He was given Procrit in the past with slight improvement of his Hgb.  Dr. Lucianne Muss preferred to have a hematologist handling this now.  He was kindly referred to the [facility] for evaluation.    Douglas George presented to the Clinic today with his wife.  He reported that he has mild fatigue.  He needs some help with activities of daily living.  He denied any visible bleeding.  He has SOB when he is severely anemic.  He would like to continue Epo injection. He has mild diffuse bone pain which he attributed to osteoarthritis.    He denied fever, anorexia, weight loss, headache, visual changes, confusion, drenching night sweats, palpable lymph node swelling, mucositis, odynophagia, nausea vomiting, jaundice, chest pain, palpitation, shortness of breath, dyspnea on exertion, productive cough, gum bleeding, epistaxis, hematemesis, hemoptysis, abdominal pain, abdominal swelling, early satiety, melena, hematochezia, hematuria, skin rash, spontaneous bleeding, heat or cold intolerance, bowel bladder incontinence, focal motor weakness, paresthesia, depression.     Past Medical History  Diagnosis Date  . Edema   . Hearing loss   . SOB (shortness of breath)     WITH WALKING  . Difficulty walking   . Skin change   . Chronic atrial fibrillation   . Diabetes mellitus type II, controlled   . Hyperlipidemia   . Depression   . Ischemic heart disease     remote stenting of the RCA in 1998 with residual LAD disease of 60 to 70% with negative nuclear in 2011  . Enlarged RV (right ventricle)     per  nuclear in 2011  . Pulmonary hypertension     per echo in 2011  . VHD (valvular heart disease)     per echo in 2011 with EF 50 to 55%, moderate AI, moderate MR, LAE, RAE, moderate TR and peak PA pressures up to 74mm  . Arthritis   . Diverticulitis   . H/O blood clots 1990's    in L leg (related to being in a cast for surgery)  . Sleep apnea   . Unspecified deficiency anemia   . Thrombocytopenia   . Anal fissure   . Anemia of chronic kidney failure   :    Past Surgical History  Procedure Laterality Date  . Thyroidectomy    . Left wrist surgery    . Achilles tendon repair      Left  . Shrapnel      [place]   . Tonsillectomy and adenoidectomy    . Inguinal hernia repair    . Cataract extraction    . Vasectomy    . Angioplasty    . Colonoscopy  2009    polyps in the past  :   CURRENT MEDS: Current Outpatient Prescriptions  Medication Sig Dispense Refill  . ACCU-CHEK AVIVA PLUS test strip daily. Use as directed      . acetaminophen (TYLENOL) 500 MG tablet Take 1,000 mg by mouth 2 (two) times daily.      . Calcium Carb-Cholecalciferol (CALCIUM 1000 + D) 1000-800 MG-UNIT  TABS Take 1 tablet by mouth daily.       . Cholecalciferol (VITAMIN D) 1000 UNITS capsule Take 1,000 Units by mouth daily.       Marland Kitchen doxazosin (CARDURA) 4 MG tablet Take 4 mg by mouth at bedtime.      . fish oil-omega-3 fatty acids 1000 MG capsule Take 1 g by mouth daily.      . furosemide (LASIX) 40 MG tablet Take 80 mg by mouth daily.       Marland Kitchen glipiZIDE (GLUCOTROL) 2.5 MG 24 hr tablet Take 2.5 mg by mouth daily.       Marland Kitchen levothyroxine (SYNTHROID, LEVOTHROID) 150 MCG tablet Take 150 mcg by mouth daily.       Marland Kitchen losartan (COZAAR) 50 MG tablet TAKE 1 TABLET BY MOUTH DAILY  30 tablet  11  . oxybutynin (DITROPAN) 5 MG tablet Take 5 mg by mouth as needed.        . ramipril (ALTACE) 5 MG capsule Take 1 capsule (5 mg total) by mouth daily.  30 capsule  11  . rosuvastatin (CRESTOR) 5 MG tablet Take 5 mg by mouth. Take 1  tablet every other day      . vitamin B-12 (CYANOCOBALAMIN) 1000 MCG tablet Take 1,000 mcg by mouth daily.      . vitamin C (ASCORBIC ACID) 500 MG tablet Take 500 mg by mouth daily.      Marland Kitchen warfarin (COUMADIN) 2.5 MG tablet Take 2.5 mg by mouth See admin instructions. Pt takes one tablet of 1.75 mg daily on Wednesdays and Saturdays only.  And take 2.5 mg tablet daily all other days.       No current facility-administered medications for this visit.      Allergies  Allergen Reactions  . Keflex (Cephalexin) Other (See Comments)  . Codeine   :  Family History  Problem Relation Age of Onset  . Heart failure Mother 54  . Diabetes Mother 78  . Heart attack Father 62  . Stroke Father 54  . Aortic aneurysm Father 24    ABDOMINAL  . Cancer Son     ?  Marland Kitchen Hepatitis Son   . Diabetes Brother   :  History   Social History  . Marital Status: Married    Spouse Name: N/A    Number of Children: 6  . Years of Education: 16+   Occupational History  . Retired     Runner, broadcasting/film/video   Social History Main Topics  . Smoking status: Former Smoker -- 2.00 packs/day for 1 years    Types: Cigarettes    Quit date: 01/01/1952  . Smokeless tobacco: Never Used  . Alcohol Use: Yes     Comment: 1 glass wine daily  . Drug Use: No  . Sexually Active: Not Currently   Other Topics Concern  . Not on file   Social History Narrative   No caffeine use   Regular exercise-no  :  REVIEW OF SYSTEM:  The rest of the 14-point review of sytem was negative.   Exam: ECOG 2.  General:  Thin, frail-appearing man, in no acute distress.  Eyes:  no scleral icterus.  ENT:  There were no oropharyngeal lesions.  Neck was without thyromegaly.  Lymphatics:  Negative cervical, supraclavicular or axillary adenopathy.  Respiratory: lungs were clear bilaterally without wheezing or crackles.  Cardiovascular:  Regular rate and rhythm, S1/S2, without murmur, rub or gallop.  There was no pedal edema.  GI:  abdomen was soft,  flat,  nontender, nondistended, without organomegaly.  Muscoloskeletal:  no spinal tenderness of palpation of vertebral spine.  Skin exam was without echymosis, petichae.  Neuro exam was nonfocal.  Patient needed total assistance to get on the exam table.  Gait was weak.  Patient was alert and oriented.  Attention was good.   Language was appropriate.  Mood was normal without depression.  Speech was not pressured.  Thought content was not tangential.    LABS:  Lab Results  Component Value Date   WBC 5.3 06/19/2013   HGB 9.7* 06/19/2013   HCT 30.1* 06/19/2013   PLT 150 06/19/2013   GLUCOSE 90 06/19/2013   ALT 16 06/19/2013   AST 32 06/19/2013   NA 140 06/19/2013   K 4.7 06/19/2013   CL 104 06/19/2013   CREATININE 1.8* 06/19/2013   BUN 59.7* 06/19/2013   CO2 23 06/19/2013   INR 2.1 06/09/2013     Blood smear review:   I personally reviewed the patient's peripheral blood smear today.  There was isocytosis.  There was no peripheral blast.  There was no schistocytosis, spherocytosis, target cell, rouleaux formation, tear drop cell.  There was no giant platelets or platelet clumps.      ASSESSMENT AND PLAN:   1.  DM, type II. 2.  Chronic kidney disease, stage III:  From presumed DM, type II.  Stable Cr for the past few years.  I will send for SPEP and serum light chain to rule out myeloma. 3.  Anemia:  - Differentials:  Most likely anemia of chronic kidney disease.  There was no evidence of iron or VitB12 deficiency.  I need to rule out myeloma.  I cannot rule out MDS. However, past bone marrow biopsy in 2009 when his Hgb was around this range was non revealing.  My suspicion for MDS is still low given stable Hgb and lack of leukopenia/thrombocytopenia. - Work up:  I sent for SPE and serum light chain to rule out myeloma. - Treatment:  Assuming work up for myeloma is negative, I discussed with Douglas George and his wife pros and cons of EPO for CKD-induced anemia.  The potential benefit is to increase Hgb and  improve quality of life since he has baseline SOB when Hgb is low.  There is slightly increased risk of CVA/CAD with EPO.  He has been receiving EPO without problem.  He would like to continue it knowing the risks/benefits.    4.  Follow up:  Monthly CBC and Aranesp for Hgb < 11.  Return visit with MD in about 3-4 months.  I informed Douglas George and his wife that I am leaving the practice. [facility] will arrange for him to see another provider when he returns.     The length of time of the face-to-face encounter was 45 minutes. More than 50% of time was spent counseling and coordination of care.     Thank you for this referral.

## 2013-06-23 LAB — PROTEIN ELECTROPHORESIS, SERUM
Alpha-1-Globulin: 4.3 % (ref 2.9–4.9)
Beta 2: 5.9 % (ref 3.2–6.5)
Gamma Globulin: 16.8 % (ref 11.1–18.8)

## 2013-06-23 LAB — KAPPA/LAMBDA LIGHT CHAINS: Kappa free light chain: 4.95 mg/dL — ABNORMAL HIGH (ref 0.33–1.94)

## 2013-06-23 LAB — VITAMIN B12: Vitamin B-12: 1215 pg/mL — ABNORMAL HIGH (ref 211–911)

## 2013-06-30 ENCOUNTER — Other Ambulatory Visit: Payer: Self-pay | Admitting: Endocrinology

## 2013-06-30 DIAGNOSIS — E039 Hypothyroidism, unspecified: Secondary | ICD-10-CM

## 2013-06-30 DIAGNOSIS — I10 Essential (primary) hypertension: Secondary | ICD-10-CM

## 2013-07-02 ENCOUNTER — Telehealth: Payer: Self-pay | Admitting: *Deleted

## 2013-07-02 NOTE — Telephone Encounter (Signed)
Wife left VM Asking for test results done on pt's last visit.

## 2013-07-06 ENCOUNTER — Other Ambulatory Visit: Payer: Medicare Other

## 2013-07-07 ENCOUNTER — Ambulatory Visit (INDEPENDENT_AMBULATORY_CARE_PROVIDER_SITE_OTHER): Payer: Medicare Other | Admitting: General Practice

## 2013-07-07 DIAGNOSIS — I4891 Unspecified atrial fibrillation: Secondary | ICD-10-CM

## 2013-07-08 ENCOUNTER — Ambulatory Visit: Payer: Medicare Other | Admitting: Endocrinology

## 2013-07-20 ENCOUNTER — Ambulatory Visit (HOSPITAL_BASED_OUTPATIENT_CLINIC_OR_DEPARTMENT_OTHER): Payer: Medicare Other

## 2013-07-20 ENCOUNTER — Other Ambulatory Visit (HOSPITAL_BASED_OUTPATIENT_CLINIC_OR_DEPARTMENT_OTHER): Payer: Medicare Other | Admitting: Lab

## 2013-07-20 VITALS — BP 146/48 | HR 61 | Temp 97.7°F

## 2013-07-20 DIAGNOSIS — D631 Anemia in chronic kidney disease: Secondary | ICD-10-CM

## 2013-07-20 LAB — CBC WITH DIFFERENTIAL/PLATELET
Eosinophils Absolute: 0.3 10*3/uL (ref 0.0–0.5)
HGB: 10.9 g/dL — ABNORMAL LOW (ref 13.0–17.1)
LYMPH%: 17.5 % (ref 14.0–49.0)
MONO#: 1 10*3/uL — ABNORMAL HIGH (ref 0.1–0.9)
NEUT#: 3.8 10*3/uL (ref 1.5–6.5)
Platelets: 124 10*3/uL — ABNORMAL LOW (ref 140–400)
RBC: 3.18 10*6/uL — ABNORMAL LOW (ref 4.20–5.82)
WBC: 6.3 10*3/uL (ref 4.0–10.3)

## 2013-07-20 MED ORDER — DARBEPOETIN ALFA-POLYSORBATE 150 MCG/0.3ML IJ SOLN
150.0000 ug | Freq: Once | INTRAMUSCULAR | Status: AC
  Administered 2013-07-20: 150 ug via SUBCUTANEOUS
  Filled 2013-07-20: qty 0.3

## 2013-07-28 ENCOUNTER — Other Ambulatory Visit: Payer: Self-pay | Admitting: Endocrinology

## 2013-07-29 ENCOUNTER — Other Ambulatory Visit: Payer: Self-pay | Admitting: General Practice

## 2013-07-29 ENCOUNTER — Other Ambulatory Visit: Payer: Self-pay | Admitting: *Deleted

## 2013-07-29 MED ORDER — GLIPIZIDE ER 2.5 MG PO TB24: 2.5000 mg | ORAL_TABLET | Freq: Every day | ORAL | Status: DC

## 2013-07-29 MED ORDER — WARFARIN SODIUM 2.5 MG PO TABS: ORAL_TABLET | ORAL | Status: DC

## 2013-08-04 ENCOUNTER — Ambulatory Visit (INDEPENDENT_AMBULATORY_CARE_PROVIDER_SITE_OTHER): Payer: Medicare Other | Admitting: General Practice

## 2013-08-04 DIAGNOSIS — I4891 Unspecified atrial fibrillation: Secondary | ICD-10-CM

## 2013-08-04 LAB — POCT INR: INR: 1.9

## 2013-08-17 ENCOUNTER — Other Ambulatory Visit (HOSPITAL_BASED_OUTPATIENT_CLINIC_OR_DEPARTMENT_OTHER): Payer: Medicare Other | Admitting: Lab

## 2013-08-17 ENCOUNTER — Ambulatory Visit: Payer: Medicare Other

## 2013-08-17 DIAGNOSIS — D631 Anemia in chronic kidney disease: Secondary | ICD-10-CM

## 2013-08-17 LAB — CBC WITH DIFFERENTIAL/PLATELET
Basophils Absolute: 0.1 10*3/uL (ref 0.0–0.1)
Eosinophils Absolute: 0.3 10*3/uL (ref 0.0–0.5)
HCT: 34.2 % — ABNORMAL LOW (ref 38.4–49.9)
HGB: 11.3 g/dL — ABNORMAL LOW (ref 13.0–17.1)
NEUT#: 3.3 10*3/uL (ref 1.5–6.5)
RDW: 13.6 % (ref 11.0–14.6)
lymph#: 0.9 10*3/uL (ref 0.9–3.3)

## 2013-08-17 MED ORDER — DARBEPOETIN ALFA-POLYSORBATE 150 MCG/0.3ML IJ SOLN: 150.0000 ug | Freq: Once | INTRAMUSCULAR | Status: DC

## 2013-08-18 ENCOUNTER — Ambulatory Visit (INDEPENDENT_AMBULATORY_CARE_PROVIDER_SITE_OTHER): Payer: Medicare Other | Admitting: Emergency Medicine

## 2013-08-18 ENCOUNTER — Encounter: Payer: Self-pay | Admitting: Emergency Medicine

## 2013-08-18 VITALS — BP 116/52 | HR 52 | Temp 97.5°F | Ht 66.0 in | Wt 149.8 lb

## 2013-08-18 DIAGNOSIS — I272 Pulmonary hypertension, unspecified: Secondary | ICD-10-CM

## 2013-08-18 DIAGNOSIS — I2789 Other specified pulmonary heart diseases: Secondary | ICD-10-CM

## 2013-08-18 DIAGNOSIS — G4730 Sleep apnea, unspecified: Secondary | ICD-10-CM

## 2013-08-18 NOTE — Patient Instructions (Signed)
We will set your BiPAP pressures at 16 and 12 based on your auto-titration information.  We will arrange for you to have a mask fitting appointment at the sleep lab We will recheck you overnight oxygen in the future to see if you need oxygen added to the BiPAP Follow with Dr Delton Coombes in 3 months or sooner if you have any problems.

## 2013-08-18 NOTE — Assessment & Plan Note (Signed)
Multifactorial - L sided disease, OSA

## 2013-08-18 NOTE — Progress Notes (Signed)
Subjective:    Patient ID: Douglas George, male    DOB: 01-Feb-1927, 79 y.o.   MRN: [id number]  HPI 79 yo former low exposure smoker (2 pk-yrs), hx DM, OSA (dx 15 yrs ago, not on CPAP),  LE DVT (his wife says these were while he was therapeutic on coumadin), HTN with restrictive diastolic dysfxn, CAD (RCA stent '98, LAD dz), moderate AI and MR, RV dilation and PAH by TTE in 2011 (estimated PASP ) and then confirmed on TTE January 2/14 (estimated PASP 86 mmHg). Followed by Dr Antoine Poche for these issues and A fib on coumadin.  His most recent TTE was performed to evaluate LE edema, has improved since lasix was increased 1/16. He denies any dyspnea, although activity is limited by his joint disease.   ROV 03/25/13 -- follows up for secondary PAH in setting untreated OSA, chronic VTE, L sided heart disease (CAD, diastolic dysfxn, valvular disease). He underwent full PFT today >> Mild (but real) AFL without BD response, normal volumes, decrease DLCO. Last visit dsat to 90% after 2 laps on RA, no overt drop to <88. Sleep study done but not yet available.  V/Q read as normal on 03/02/13 (has been on anti-coagulation).  Auto-immune panel is negative (2/25).  He is wondering about whether he should get the R heart cath.   ROV 05/15/13 -- Follows for his secondary PAH with w/u and eval as above. He has had his CPAP titration study, results not yet available. Since last time he unfortunately was admitted in [facility] for UTI and heat exhaustion.   ROV 07/1913 -- secondary PAH identified on TTE last done 2/14. He underwent PSG, has started auto-set BiPAP (5-25), started on . Returns for follow up. Compliance data is available > shows that he is using > 4 hours 73 % of the time, used it every night. Data recommends starting with 16/12 but there was significant leakage. (SLE 1006 is mask fitting). He feels much better since starting the NIPPV. Lees exertional SOB, no longer napping.    Review of Systems  Constitutional:  Positive for unexpected weight change. Negative for fever.  HENT: Positive for trouble swallowing. Negative for ear pain, nosebleeds, congestion, sore throat, rhinorrhea, sneezing, dental problem, postnasal drip and sinus pressure.   Eyes: Negative for redness and itching.  Respiratory: Positive for cough. Negative for chest tightness, shortness of breath and wheezing.   Cardiovascular: Negative for palpitations and leg swelling.  Gastrointestinal: Negative for nausea and vomiting.  Genitourinary: Negative for dysuria.  Musculoskeletal: Negative for joint swelling.  Skin: Negative for rash.  Neurological: Negative for headaches.  Hematological: Does not bruise/bleed easily.  Psychiatric/Behavioral: Negative for dysphoric mood. The patient is not nervous/anxious.     V/Q scan 03/02/13 --  Findings: The ventilation scan is normal. No ventilation defects.  The perfusion lung scan is normal. No segmental or subsegmental  defects to suggest pulmonary embolism.  IMPRESSION:  Normal ventilation perfusion lung scan       Objective:   Physical Exam Filed Vitals:   08/18/13 1330  BP: 116/52  Pulse: 52  Temp: 97.5 F (36.4 C)  Gen: Pleasant, elderly somewhat debilitated, in no distress,  normal affect, uses walker  ENT: No lesions,  mouth clear,  Single prominent telangectasia underneath tongue  Neck: No JVD, no TMG, no carotid bruits  Lungs: No use of accessory muscles, no dullness to percussion, clear without rales or rhonchi  Cardiovascular: irregular, heart sounds normal, no murmur or gallops,  Musculoskeletal: No deformities, no cyanosis or clubbing  Neuro: alert, non focal  Skin: Warm, no lesions or rashes, no edema.   TTE 01/16/13 --  - Left ventricle: The cavity size was normal. Wall thickness was normal. Systolic function was normal. The estimated ejection fraction was in the range of 55% to 60%. Wall motion was normal; there were no regional wall motion abnormalities.  Doppler parameters are consistent with restrictive physiology, indicative of decreased left ventricular diastolic compliance and/or increased left atrial pressure. - Aortic valve: Trileaflet; moderately calcified leaflets. There was no stenosis. Mild regurgitation. - Ascending aorta: The visualized portion of the ascending aorta was dilated to 4.1 cm. - Mitral valve: Mildly calcified annulus. There was no evidence for stenosis. Mild regurgitation. - Left atrium: The atrium was severely dilated. - Right ventricle: D-shaped interventricular septum suggestive RV pressure and volume overload. The cavity size was moderately dilated. Systolic function was mildly reduced. - Right atrium: The atrium was severely dilated. - Tricuspid valve: Peak RV-RA gradient: 66mm Hg (S). - Pulmonary arteries: PA peak pressure: 86mm Hg (S). - Systemic veins: IVC dilated to 3.8 cm with no respirophasic variation, suggesting RA pressure 20 mmHg. Impressions:  - Normal LV size and systolic function, EF 55-60%. Restrictive diastolic function. Moderately dilated RV with mildly decreased systolic function. Severe pulmonary hypertension. Dilated IVC suggesting elevated RV filling pressure.      Assessment & Plan:  Pulmonary hypertension Multifactorial - L sided disease, OSA  Unspecified sleep apnea Tolerating BiPAP. Data supports starting 16/12. Will need an ONO once stable on these settings. Could consider formal mask fitting to address the leakage. SLE 1006

## 2013-08-18 NOTE — Assessment & Plan Note (Signed)
Tolerating BiPAP. Data supports starting 16/12. Will need an ONO once stable on these settings. Could consider formal mask fitting to address the leakage. SLE 1006

## 2013-08-21 ENCOUNTER — Encounter: Payer: Self-pay | Admitting: Cardiology

## 2013-08-21 ENCOUNTER — Ambulatory Visit (INDEPENDENT_AMBULATORY_CARE_PROVIDER_SITE_OTHER): Payer: Medicare Other | Admitting: Cardiology

## 2013-08-21 ENCOUNTER — Telehealth: Payer: Self-pay | Admitting: Emergency Medicine

## 2013-08-21 VITALS — BP 124/44 | HR 57 | Ht 66.0 in | Wt 148.0 lb

## 2013-08-21 DIAGNOSIS — G4730 Sleep apnea, unspecified: Secondary | ICD-10-CM

## 2013-08-21 DIAGNOSIS — I482 Chronic atrial fibrillation, unspecified: Secondary | ICD-10-CM

## 2013-08-21 DIAGNOSIS — I4891 Unspecified atrial fibrillation: Secondary | ICD-10-CM

## 2013-08-21 NOTE — Progress Notes (Signed)
HPI The patient presents for follow of atrial fib and CAD and pulmonary HTN which we are treating conservatively. Since I last saw him he has had treatment with CPAP titration and has done remarkably well with this. His lites says he has increased energy. He is sleeping less during the day. They did have a recent adjustment which didn't work and they're waiting to hear from Dr. Delton Coombes about changing back to the previous settings. He's not had any active bleeding issues. His joints are doing better. He denies any acute shortness of breath, PND or orthopnea. He's had no palpitations, presyncope or syncope  Allergies  Allergen Reactions  . Keflex [Cephalexin] Other (See Comments)  . Codeine     Current Outpatient Prescriptions  Medication Sig Dispense Refill  . ACCU-CHEK AVIVA PLUS test strip daily. Use as directed      . acetaminophen (TYLENOL) 500 MG tablet Take 1,000 mg by mouth 2 (two) times daily.      . Calcium Carb-Cholecalciferol (CALCIUM 1000 + D) 1000-800 MG-UNIT TABS Take 1 tablet by mouth daily.       . Cholecalciferol (VITAMIN D) 1000 UNITS capsule Take 1,000 Units by mouth daily.       Marland Kitchen doxazosin (CARDURA) 4 MG tablet Take 4 mg by mouth at bedtime.      . fish oil-omega-3 fatty acids 1000 MG capsule Take 1 g by mouth daily.      . furosemide (LASIX) 80 MG tablet Take 80 mg by mouth daily.      Marland Kitchen glipiZIDE (GLUCOTROL XL) 2.5 MG 24 hr tablet Take 1 tablet (2.5 mg total) by mouth daily.  30 tablet  5  . levothyroxine (SYNTHROID, LEVOTHROID) 150 MCG tablet Take 150 mcg by mouth daily.       Marland Kitchen losartan (COZAAR) 50 MG tablet TAKE 1 TABLET BY MOUTH DAILY  30 tablet  11  . oxybutynin (DITROPAN) 5 MG tablet Take 5 mg by mouth as needed.        . ramipril (ALTACE) 5 MG capsule Take 1 capsule (5 mg total) by mouth daily.  30 capsule  11  . rosuvastatin (CRESTOR) 5 MG tablet Take 5 mg by mouth. Take 1 tablet every other day      . vitamin B-12 (CYANOCOBALAMIN) 1000 MCG tablet Take 1,000  mcg by mouth daily.      . vitamin C (ASCORBIC ACID) 500 MG tablet Take 500 mg by mouth daily.      Marland Kitchen warfarin (COUMADIN) 2.5 MG tablet Take as directed by [facility].  30 tablet  2   No current facility-administered medications for this visit.    Past Medical History  Diagnosis Date  . Edema   . Hearing loss   . SOB (shortness of breath)     WITH WALKING  . Difficulty walking   . Skin change   . Chronic atrial fibrillation   . Diabetes mellitus type II, controlled   . Hyperlipidemia   . Depression   . Ischemic heart disease     remote stenting of the RCA in 1998 with residual LAD disease of 60 to 70% with negative nuclear in 2011  . Enlarged RV (right ventricle)     per nuclear in 2011  . Pulmonary hypertension     per echo in 2011  . VHD (valvular heart disease)     per echo in 2011 with EF 50 to 55%, moderate AI, moderate MR, LAE, RAE, moderate TR and peak PA pressures  up to 74mm  . Arthritis   . Diverticulitis   . H/O blood clots 1990's    in L leg (related to being in a cast for surgery)  . Sleep apnea   . Unspecified deficiency anemia   . Thrombocytopenia   . Anal fissure   . Anemia of chronic kidney failure     Past Surgical History  Procedure Laterality Date  . Thyroidectomy    . Left wrist surgery    . Achilles tendon repair      Left  . [place]rapnel      [place]   . Tonsillectomy and adenoidectomy    . Inguinal hernia repair    . Cataract extraction    . Vasectomy    . Angioplasty    . Colonoscopy  2009    polyps in the past    ROS:  As stated in the HPI and negative for all other systems.  PHYSICAL EXAM BP 124/44  Pulse 57  Ht 5\' 6"  (1.676 m)  Wt 148 lb (67.132 kg)  BMI 23.9 kg/m2 PHYSICAL EXAM GEN:  No distress, very frail NECK:  positivejugular venous distentionI had a, waveform within normal limits, carotid upstroke brisk and symmetric, no bruits, no thyromegaly LUNGS:  Clear to auscultation bilaterally BACK:  No CVA tenderness CHEST:   Unremarkable HEART:  S1 and S2 within normal limits, no S3,  no clicks, no rubs, right lower sternal border holosystolic murmur ABD:  Positive bowel sounds normal in frequency in pitch, no rebound, no guarding,  positive midline bruit EXT:  2 plus pulses throughout, trace edema,  NEURO:  Cranial nerves II through XII grossly intact, motor grossly intact throughout PSYCH:  Cognitively intact, oriented to person place and time  EKG:  Atrial fibrillation rate 57 axis and intervals within normal limits, no acute ST-T wave changes. 08/21/2013  ASSESSMENT AND PLAN  Atrial fibrillation -   Mr. DEQUANE STRAHAN has a CHA2DS2 - VASc score of 5 with a risk of stroke of 6.7%  and a HAS - BLED score of 3.with one validation study suggesting a high risk of bleeding.  He is doing well with anticoagulation and he will continue this therapy.  Enlarged RV (right ventricle) -  This is being managed symptomatically. He has had great improvement in symptoms since starting CPAP and he will continue to have this titrated by Dr. Delton Coombes  Ischemic heart disease -  He has no active angina. He had a stress perfusion study in 2011 the without significant ischemia. No further evaluation is indicated. He will continue with risk reduction.   Edema -  He has no significant edema. No change in therapy is indicated.  Bradycardia - He has no symptoms related to this. No change in therapy is indicated.

## 2013-08-21 NOTE — Telephone Encounter (Signed)
Pt spouse advised and order placed. Jennifer Castillo, CMA  

## 2013-08-21 NOTE — Telephone Encounter (Signed)
Per RB- this is fine to return to previous settings-- autotitration on BiPap DME [facility]

## 2013-08-21 NOTE — Patient Instructions (Addendum)
The current medical regimen is effective;  continue present plan and medications.  Follow up in 4 months with Dr Hochrein 

## 2013-08-21 NOTE — Telephone Encounter (Signed)
Spoke with patients wife-patient has not slept well with BiPAP since pressure was increased. Pt likes the pressure he was on prior to increase. [facility] is DME. RB please advise. Thanks.

## 2013-08-26 ENCOUNTER — Other Ambulatory Visit (HOSPITAL_BASED_OUTPATIENT_CLINIC_OR_DEPARTMENT_OTHER): Payer: Medicare Other

## 2013-08-29 ENCOUNTER — Other Ambulatory Visit: Payer: Self-pay | Admitting: Endocrinology

## 2013-09-02 ENCOUNTER — Other Ambulatory Visit (HOSPITAL_BASED_OUTPATIENT_CLINIC_OR_DEPARTMENT_OTHER): Payer: Medicare Other

## 2013-09-08 ENCOUNTER — Ambulatory Visit (HOSPITAL_BASED_OUTPATIENT_CLINIC_OR_DEPARTMENT_OTHER): Payer: Medicare Other | Attending: Emergency Medicine | Admitting: Radiology

## 2013-09-08 DIAGNOSIS — G4733 Obstructive sleep apnea (adult) (pediatric): Secondary | ICD-10-CM

## 2013-09-08 DIAGNOSIS — G4730 Sleep apnea, unspecified: Secondary | ICD-10-CM

## 2013-09-14 ENCOUNTER — Ambulatory Visit (HOSPITAL_BASED_OUTPATIENT_CLINIC_OR_DEPARTMENT_OTHER): Payer: Medicare Other

## 2013-09-14 ENCOUNTER — Other Ambulatory Visit (HOSPITAL_BASED_OUTPATIENT_CLINIC_OR_DEPARTMENT_OTHER): Payer: Medicare Other | Admitting: Lab

## 2013-09-14 VITALS — BP 148/60 | HR 56 | Temp 97.9°F

## 2013-09-14 DIAGNOSIS — D631 Anemia in chronic kidney disease: Secondary | ICD-10-CM

## 2013-09-14 LAB — CBC WITH DIFFERENTIAL/PLATELET
Basophils Absolute: 0.1 10*3/uL (ref 0.0–0.1)
Eosinophils Absolute: 0.4 10*3/uL (ref 0.0–0.5)
HCT: 28.9 % — ABNORMAL LOW (ref 38.4–49.9)
LYMPH%: 16.4 % (ref 14.0–49.0)
MCV: 99.9 fL — ABNORMAL HIGH (ref 79.3–98.0)
MONO#: 0.6 10*3/uL (ref 0.1–0.9)
MONO%: 12.3 % (ref 0.0–14.0)
NEUT#: 3.3 10*3/uL (ref 1.5–6.5)
NEUT%: 63.4 % (ref 39.0–75.0)
Platelets: 97 10*3/uL — ABNORMAL LOW (ref 140–400)
WBC: 5.2 10*3/uL (ref 4.0–10.3)

## 2013-09-14 MED ORDER — DARBEPOETIN ALFA-POLYSORBATE 150 MCG/0.3ML IJ SOLN
150.0000 ug | Freq: Once | INTRAMUSCULAR | Status: AC
  Administered 2013-09-14: 150 ug via SUBCUTANEOUS
  Filled 2013-09-14: qty 0.3

## 2013-09-16 ENCOUNTER — Encounter: Payer: Self-pay | Admitting: Endocrinology

## 2013-09-16 ENCOUNTER — Ambulatory Visit (INDEPENDENT_AMBULATORY_CARE_PROVIDER_SITE_OTHER): Payer: Medicare Other | Admitting: General Practice

## 2013-09-16 ENCOUNTER — Ambulatory Visit (INDEPENDENT_AMBULATORY_CARE_PROVIDER_SITE_OTHER): Payer: Medicare Other | Admitting: Endocrinology

## 2013-09-16 VITALS — BP 152/82 | HR 64 | Temp 98.2°F | Resp 12 | Ht 64.0 in | Wt 155.1 lb

## 2013-09-16 DIAGNOSIS — E119 Type 2 diabetes mellitus without complications: Secondary | ICD-10-CM

## 2013-09-16 DIAGNOSIS — I10 Essential (primary) hypertension: Secondary | ICD-10-CM

## 2013-09-16 DIAGNOSIS — I4891 Unspecified atrial fibrillation: Secondary | ICD-10-CM

## 2013-09-16 DIAGNOSIS — E039 Hypothyroidism, unspecified: Secondary | ICD-10-CM | POA: Insufficient documentation

## 2013-09-16 DIAGNOSIS — Z23 Encounter for immunization: Secondary | ICD-10-CM

## 2013-09-16 DIAGNOSIS — E785 Hyperlipidemia, unspecified: Secondary | ICD-10-CM

## 2013-09-16 LAB — BASIC METABOLIC PANEL
BUN: 63 mg/dL — ABNORMAL HIGH (ref 6–23)
CO2: 25 mEq/L (ref 19–32)
Calcium: 8.9 mg/dL (ref 8.4–10.5)
Chloride: 103 mEq/L (ref 96–112)
Creatinine, Ser: 1.8 mg/dL — ABNORMAL HIGH (ref 0.4–1.5)

## 2013-09-16 LAB — LIPID PANEL
HDL: 49.8 mg/dL (ref >39.00)
Total CHOL/HDL Ratio: 2
VLDL: 3.2 mg/dL (ref 0.0–40.0)

## 2013-09-16 LAB — HEMOGLOBIN A1C: Hgb A1c MFr Bld: 6.6 % — ABNORMAL HIGH (ref 4.6–6.5)

## 2013-09-16 LAB — POCT INR: INR: 0

## 2013-09-16 NOTE — Progress Notes (Signed)
Patient ID: Douglas George, male   DOB: 1927/11/26, 79 y.o.   MRN: [id number]  Douglas George is an 79 y.o. male.   Reason for Appointment: Diabetes follow-up   History of Present Illness   Diagnosis: Type 2 DIABETES MELITUS, date of diagnosis:  2003    Previous history: He has had mild diabetes which is consistently well controlled. Previously had been on Actos which was stopped because of potentially worsening his edema. He has been on low-dose glipizide ER subsequently  Recent history: Blood sugars still appear to be very well controlled, he is checking readings after evening meal and then these are generally fairly good. Occasionally he may check with an hour of eating     Oral hypoglycemic drugs: Glipizide ER. Side effects from medications: None       Monitors blood glucose: Once a day.    Glucometer: One Touch.          Blood Glucose readings from meter download: readings before breakfast: Not checked. Evening 106-192 with average 141  Hypoglycemia frequency:  none        Meals: 3 meals per day.  usually trying to restrict carbohydrates         Physical activity:  minimal           Complications: are: None  The last HbgA1c is not available  Wt Readings from Last 3 Encounters:  09/16/13 155 lb 1.6 oz (70.353 kg)  08/21/13 148 lb (67.132 kg)  08/18/13 149 lb 12.8 oz (67.949 kg)   PROBLEM #2: Chronic leg edema: This is likely to be from venous insufficiency. He is consistent with wearing his elastic stockings Also has been on chronic diuretics, now taking Lasix 80 mg daily. He sometimes will increase the dose on his own if edema is consistently worse he  HYPERTENSION: He has had long-standing hypertension. Not checking blood pressure at home recently. His blood pressure is somewhat difficult to auscultate and may be unequal in the 2 arms. Currently on doxazosin, low-dose Cozaar and ramipril from cardiologist      Medication List       This list is accurate as of: 09/16/13  11:59 PM.  Always use your most recent med list.               ACCU-CHEK AVIVA PLUS test strip  Generic drug:  glucose blood  TEST ONCE EACH DAY AS DIRECTED     acetaminophen 500 MG tablet  Commonly known as:  TYLENOL  Take 1,000 mg by mouth 2 (two) times daily.     CALCIUM 1000 + D 1000-800 MG-UNIT Tabs  Generic drug:  Calcium Carb-Cholecalciferol  Take 1 tablet by mouth daily.     clotrimazole-betamethasone cream  Commonly known as:  LOTRISONE     doxazosin 4 MG tablet  Commonly known as:  CARDURA  Take 4 mg by mouth at bedtime.     fish oil-omega-3 fatty acids 1000 MG capsule  Take 1 g by mouth daily.     glipiZIDE 2.5 MG 24 hr tablet  Commonly known as:  GLUCOTROL XL  Take 1 tablet (2.5 mg total) by mouth daily.     LASIX 80 MG tablet  Generic drug:  furosemide  Take 80 mg by mouth daily.     levothyroxine 150 MCG tablet  Commonly known as:  SYNTHROID, LEVOTHROID  Take 150 mcg by mouth daily.     losartan 50 MG tablet  Commonly known as:  COZAAR  TAKE  1 TABLET BY MOUTH DAILY     oxybutynin 5 MG tablet  Commonly known as:  DITROPAN  Take 5 mg by mouth as needed.     ramipril 5 MG capsule  Commonly known as:  ALTACE  Take 1 capsule (5 mg total) by mouth daily.     rosuvastatin 5 MG tablet  Commonly known as:  CRESTOR  Take 5 mg by mouth. Take 1 tablet every other day     vitamin B-12 1000 MCG tablet  Commonly known as:  CYANOCOBALAMIN  Take 1,000 mcg by mouth daily.     vitamin C 500 MG tablet  Commonly known as:  ASCORBIC ACID  Take 500 mg by mouth daily.     Vitamin D 1000 UNITS capsule  Take 1,000 Units by mouth daily.     warfarin 2.5 MG tablet  Commonly known as:  COUMADIN  Take as directed by [facility].        Allergies:  Allergies  Allergen Reactions  . Keflex [Cephalexin] Other (See Comments)  . Codeine     Past Medical History  Diagnosis Date  . Edema   . Hearing loss   . SOB (shortness of breath)     WITH WALKING   . Difficulty walking   . Skin change   . Chronic atrial fibrillation   . Diabetes mellitus type II, controlled   . Hyperlipidemia   . Depression   . Ischemic heart disease     remote stenting of the RCA in 1998 with residual LAD disease of 60 to 70% with negative nuclear in 2011  . Enlarged RV (right ventricle)     per nuclear in 2011  . Pulmonary hypertension     per echo in 2011  . VHD (valvular heart disease)     per echo in 2011 with EF 50 to 55%, moderate AI, moderate MR, LAE, RAE, moderate TR and peak PA pressures up to 74mm  . Arthritis   . Diverticulitis   . H/O blood clots 1990's    in L leg (related to being in a cast for surgery)  . Sleep apnea   . Unspecified deficiency anemia   . Thrombocytopenia   . Anal fissure   . Anemia of chronic kidney failure     Past Surgical History  Procedure Laterality Date  . Thyroidectomy    . Left wrist surgery    . Achilles tendon repair      Left  . Shrapnel      [place]   . Tonsillectomy and adenoidectomy    . Inguinal hernia repair    . Cataract extraction    . Vasectomy    . Angioplasty    . Colonoscopy  2009    polyps in the past    Family History  Problem Relation Age of Onset  . Heart failure Mother 35  . Diabetes Mother 15  . Heart attack Father 21  . Stroke Father 12  . Aortic aneurysm Father 34    ABDOMINAL  . Cancer Son     ?  Marland Kitchen Hepatitis Son   . Diabetes Brother     Social History:  reports that he quit smoking about 61 years ago. His smoking use included Cigarettes. He has a 2 pack-year smoking history. He has never used smokeless tobacco. He reports that  drinks alcohol. He reports that he does not use illicit drugs.  Review of Systems:  Hypothyroidism:  he has had long-standing hypothyroidism and has  been recently stable with 150 mcg of levothyroxine which he takes in the mornings daily. TSH pending   Lipids: Been treated with Crestor 5 mg , also takes fish oil   He has had significant  osteoarthritis of his hip limiting his activity      Examination:   BP 152/82  Pulse 64  Temp(Src) 98.2 F (36.8 C)  Resp 12  Ht 5\' 4"  (1.626 m)  Wt 155 lb 1.6 oz (70.353 kg)  BMI 26.61 kg/m2  SpO2 98%  Body mass index is 26.61 kg/(m^2).   Repeat blood pressure on the left side 138/60 confirmed with palpation 1+ ankle edema present  ASSESSMENT/ PLAN::   1. Diabetes type 2   Blood glucose control appears fairly good with average glucose 141 postprandial. No hypoglycemia with low-dose glipizide. He will continue the same dose and watch diet. Currently unable to do much exercise  2. Anemia of chronic disease, now followed by hematologist. He is periodically getting Aranesp. Also on iron and B12  3. Chronic kidney disease, likely to be from nephrosclerosis. Previously creatinine has been about 1.6, this will be reassessed  [facility] 09/19/2013, 3:37 PM   Addendum: Labs as follows: Office Visit on 09/16/2013  Component Date Value Range Status  . Sodium 09/16/2013 135  135 - 145 mEq/L Final  . Potassium 09/16/2013 4.5  3.5 - 5.1 mEq/L Final  . Chloride 09/16/2013 103  96 - 112 mEq/L Final  . CO2 09/16/2013 25  19 - 32 mEq/L Final  . Glucose, Bld 09/16/2013 111* 70 - 99 mg/dL Final  . BUN 81/19/1478 63* 6 - 23 mg/dL Final  . Creatinine, Ser 09/16/2013 1.8* 0.4 - 1.5 mg/dL Final  . Calcium 29/56/2130 8.9  8.4 - 10.5 mg/dL Final  . GFR 86/57/8469 38.86* >60.00 mL/min Final  . TSH 09/16/2013 2.50  0.35 - 5.50 uIU/mL Final  . Free T4 09/16/2013 1.15  0.60 - 1.60 ng/dL Final  . Cholesterol 62/95/2841 89  0 - 200 mg/dL Final   ATP III Classification       Desirable:  < 200 mg/dL               Borderline High:  200 - 239 mg/dL          High:  > = 324 mg/dL  . Triglycerides 09/16/2013 16.0  0.0 - 149.0 mg/dL Final   Normal:  <401 mg/dLBorderline High:  150 - 199 mg/dL  . HDL 09/16/2013 49.80  >39.00 mg/dL Final  . VLDL 02/72/5366 3.2  0.0 - 44.0 mg/dL Final  . LDL Cholesterol  09/16/2013 36  0 - 99 mg/dL Final  . Total CHOL/HDL Ratio 09/16/2013 2   Final                  Men          Women1/2 Average Risk     3.4          3.3Average Risk          5.0          4.42X Average Risk          9.6          7.13X Average Risk          15.0          11.0                      . Hemoglobin A1C 09/16/2013 6.6* 4.6 -  6.5 % Final   Glycemic Control Guidelines for People with Diabetes:Non Diabetic:  <6%Goal of Therapy: <7%Additional Action Suggested:  >8%   Anti-coag visit on 09/16/2013  Component Date Value Range Status  . INR 09/16/2013 0   Final  Appointment on 09/14/2013  Component Date Value Range Status  . WBC 09/14/2013 5.2  4.0 - 10.3 10e3/uL Final  . NEUT# 09/14/2013 3.3  1.5 - 6.5 10e3/uL Final  . HGB 09/14/2013 9.7* 13.0 - 17.1 g/dL Final  . HCT 16/10/9603 28.9* 38.4 - 49.9 % Final  . Platelets 09/14/2013 97* 140 - 400 10e3/uL Final  . MCV 09/14/2013 99.9* 79.3 - 98.0 fL Final  . MCH 09/14/2013 33.6* 27.2 - 33.4 pg Final  . MCHC 09/14/2013 33.6  32.0 - 36.0 g/dL Final  . RBC 54/08/8118 2.90* 4.20 - 5.82 10e6/uL Final  . RDW 09/14/2013 13.6  11.0 - 14.6 % Final  . lymph# 09/14/2013 0.9  0.9 - 3.3 10e3/uL Final  . MONO# 09/14/2013 0.6  0.1 - 0.9 10e3/uL Final  . Eosinophils Absolute 09/14/2013 0.4  0.0 - 0.5 10e3/uL Final  . Basophils Absolute 09/14/2013 0.1  0.0 - 0.1 10e3/uL Final  . NEUT% 09/14/2013 63.4  39.0 - 75.0 % Final  . LYMPH% 09/14/2013 16.4  14.0 - 49.0 % Final  . MONO% 09/14/2013 12.3  0.0 - 14.0 % Final  . EOS% 09/14/2013 6.9  0.0 - 7.0 % Final  . BASO% 09/14/2013 1.0  0.0 - 2.0 % Final

## 2013-09-19 NOTE — Patient Instructions (Signed)
Continue same medications.

## 2013-09-23 ENCOUNTER — Other Ambulatory Visit: Payer: Self-pay | Admitting: Endocrinology

## 2013-10-02 ENCOUNTER — Telehealth: Payer: Self-pay | Admitting: *Deleted

## 2013-10-02 NOTE — Telephone Encounter (Signed)
Wife called, she wants to know if you have received documents from the [facility]?

## 2013-10-02 NOTE — Telephone Encounter (Signed)
No

## 2013-10-13 ENCOUNTER — Other Ambulatory Visit: Payer: Self-pay | Admitting: Endocrinology

## 2013-10-15 ENCOUNTER — Other Ambulatory Visit: Payer: Self-pay | Admitting: Hematology and Oncology

## 2013-10-15 ENCOUNTER — Telehealth: Payer: Self-pay | Admitting: Hematology and Oncology

## 2013-10-15 DIAGNOSIS — D539 Nutritional anemia, unspecified: Secondary | ICD-10-CM

## 2013-10-15 NOTE — Telephone Encounter (Signed)
Gave pt appt for 10/20 /14 lab, MD injections

## 2013-10-19 ENCOUNTER — Ambulatory Visit: Payer: Medicare Other

## 2013-10-19 ENCOUNTER — Telehealth: Payer: Self-pay | Admitting: Hematology and Oncology

## 2013-10-19 ENCOUNTER — Ambulatory Visit (HOSPITAL_BASED_OUTPATIENT_CLINIC_OR_DEPARTMENT_OTHER): Payer: Medicare Other | Admitting: Hematology and Oncology

## 2013-10-19 ENCOUNTER — Other Ambulatory Visit (HOSPITAL_BASED_OUTPATIENT_CLINIC_OR_DEPARTMENT_OTHER): Payer: Medicare Other

## 2013-10-19 ENCOUNTER — Encounter: Payer: Self-pay | Admitting: Hematology and Oncology

## 2013-10-19 ENCOUNTER — Other Ambulatory Visit: Payer: Self-pay | Admitting: Hematology and Oncology

## 2013-10-19 VITALS — BP 144/53 | HR 62 | Temp 97.1°F | Resp 21 | Wt 154.4 lb

## 2013-10-19 DIAGNOSIS — D6489 Other specified anemias: Secondary | ICD-10-CM

## 2013-10-19 DIAGNOSIS — R220 Localized swelling, mass and lump, head: Secondary | ICD-10-CM

## 2013-10-19 DIAGNOSIS — R221 Localized swelling, mass and lump, neck: Secondary | ICD-10-CM

## 2013-10-19 DIAGNOSIS — D539 Nutritional anemia, unspecified: Secondary | ICD-10-CM

## 2013-10-19 DIAGNOSIS — K047 Periapical abscess without sinus: Secondary | ICD-10-CM | POA: Insufficient documentation

## 2013-10-19 DIAGNOSIS — H109 Unspecified conjunctivitis: Secondary | ICD-10-CM

## 2013-10-19 DIAGNOSIS — D696 Thrombocytopenia, unspecified: Secondary | ICD-10-CM

## 2013-10-19 DIAGNOSIS — D649 Anemia, unspecified: Secondary | ICD-10-CM

## 2013-10-19 HISTORY — DX: Localized swelling, mass and lump, neck: R22.1

## 2013-10-19 LAB — MORPHOLOGY

## 2013-10-19 LAB — COMPREHENSIVE METABOLIC PANEL (CC13)
ALT: 14 U/L (ref 0–55)
Anion Gap: 12 mEq/L — ABNORMAL HIGH (ref 3–11)
BUN: 75.7 mg/dL — ABNORMAL HIGH (ref 7.0–26.0)
CO2: 22 mEq/L (ref 22–29)
Calcium: 9.3 mg/dL (ref 8.4–10.4)
Chloride: 103 mEq/L (ref 98–109)
Creatinine: 1.9 mg/dL — ABNORMAL HIGH (ref 0.7–1.3)
Total Bilirubin: 1.03 mg/dL (ref 0.20–1.20)

## 2013-10-19 LAB — IRON AND TIBC CHCC
%SAT: 36 % (ref 20–55)
Iron: 101 ug/dL (ref 42–163)
TIBC: 283 ug/dL (ref 202–409)
UIBC: 182 ug/dL (ref 117–376)

## 2013-10-19 LAB — CBC & DIFF AND RETIC
BASO%: 0.6 % (ref 0.0–2.0)
EOS%: 5.3 % (ref 0.0–7.0)
HCT: 29.6 % — ABNORMAL LOW (ref 38.4–49.9)
LYMPH%: 14.2 % (ref 14.0–49.0)
MCH: 32.9 pg (ref 27.2–33.4)
MCHC: 33.1 g/dL (ref 32.0–36.0)
MCV: 99.3 fL — ABNORMAL HIGH (ref 79.3–98.0)
MONO%: 12.2 % (ref 0.0–14.0)
NEUT%: 67.7 % (ref 39.0–75.0)
Platelets: 123 10*3/uL — ABNORMAL LOW (ref 140–400)
RBC: 2.98 10*6/uL — ABNORMAL LOW (ref 4.20–5.82)
Retic %: 0.68 % — ABNORMAL LOW (ref 0.80–1.80)

## 2013-10-19 MED ORDER — TOBRAMYCIN 0.3 % OP SOLN: 1.0000 [drp] | Freq: Four times a day (QID) | OPHTHALMIC | Status: DC

## 2013-10-19 MED ORDER — AMOXICILLIN 500 MG PO CAPS: 500.0000 mg | ORAL_CAPSULE | Freq: Three times a day (TID) | ORAL | Status: DC

## 2013-10-19 NOTE — Progress Notes (Signed)
[facility] OFFICE PROGRESS NOTE  Douglas Reaper, NP DIAGNOSIS: Chronic anemia  SUMMARY OF HEMATOLOGIC HISTORY: This is a patient who has been followed here for many years. He had history of chronic anemia for some time and had a bone marrow aspirate and biopsy done in 2007 that was unremarkable. Due to chronic anemia and chronic kidney disease he was referred to the cancer Center to receive erythropoietin stimulating agents. Due to anemia, he had received one dose of intravenous iron infusion in January 2014 and darbepoetin injection since June of 2014. INTERVAL HISTORY: Douglas George 79 y.o. male returns for return visit about his anemia. Over the past few months, he has easy bruising but no evidence of spontaneous bleeding such as epistaxis, hematuria, or hematochezia. The patient had very poor dentition and have recurrent dental abscess and back dentition requiring extraction but his surgery is cancelled due to new onset of thrombocytopenia. He denies any recent fever, chills, night sweats or abnormal weight loss  I have reviewed the past medical history, past surgical history, social history and family history with the patient and they are unchanged from previous note.  ALLERGIES:  is allergic to keflex and codeine.  MEDICATIONS:  Current Outpatient Prescriptions  Medication Sig Dispense Refill  . ACCU-CHEK AVIVA PLUS test strip TEST ONCE EACH DAY AS DIRECTED  100 each  0  . acetaminophen (TYLENOL) 500 MG tablet Take 1,000 mg by mouth 2 (two) times daily.      . Calcium Carb-Cholecalciferol (CALCIUM 1000 + D) 1000-800 MG-UNIT TABS Take 1 tablet by mouth daily.       . Cholecalciferol (VITAMIN D) 1000 UNITS capsule Take 1,000 Units by mouth daily.       . clotrimazole-betamethasone (LOTRISONE) cream       . CRESTOR 10 MG tablet TAKE ONE TABLET BY MOUTH DAILY  30 tablet  5  . doxazosin (CARDURA) 4 MG tablet Take 4 mg by mouth at bedtime.      . fish oil-omega-3 fatty  acids 1000 MG capsule Take 1 g by mouth daily.      . furosemide (LASIX) 80 MG tablet TAKE 1 AND 1/2 TABLETS BY MOUTH EVERY DAY X 30 DAYS  45 tablet  6  . glipiZIDE (GLUCOTROL XL) 2.5 MG 24 hr tablet Take 1 tablet (2.5 mg total) by mouth daily.  30 tablet  5  . levothyroxine (SYNTHROID, LEVOTHROID) 150 MCG tablet Take 150 mcg by mouth daily.       Marland Kitchen losartan (COZAAR) 50 MG tablet TAKE 1 TABLET BY MOUTH DAILY  30 tablet  11  . oxybutynin (DITROPAN) 5 MG tablet Take 5 mg by mouth as needed.        . ramipril (ALTACE) 5 MG capsule Take 1 capsule (5 mg total) by mouth daily.  30 capsule  11  . rosuvastatin (CRESTOR) 5 MG tablet Take 5 mg by mouth. Take 1 tablet every other day      . vitamin B-12 (CYANOCOBALAMIN) 1000 MCG tablet Take 1,000 mcg by mouth daily.      . vitamin C (ASCORBIC ACID) 500 MG tablet Take 500 mg by mouth daily.      Marland Kitchen warfarin (COUMADIN) 2.5 MG tablet Take as directed by [facility].  30 tablet  2  . amoxicillin (AMOXIL) 500 MG capsule Take 1 capsule (500 mg total) by mouth 3 (three) times daily.  30 capsule  0  . tobramycin (TOBREX) 0.3 % ophthalmic solution Place 1 drop into  the right eye every 6 (six) hours.  5 mL  0   No current facility-administered medications for this visit.     REVIEW OF SYSTEMS:   Constitutional: Denies fevers, chills or night sweats Eyes: Denies blurriness of vision Ears, nose, mouth, throat, and face: Denies mucositis or sore throat Respiratory: Denies cough, dyspnea or wheezes Cardiovascular: Denies palpitation, chest discomfort or lower extremity swelling Gastrointestinal:  Denies nausea, heartburn or change in bowel habits Skin: He has numerous skin lesions that were biopsied by his dermatologist and being followed very closely Lymphatics: Denies new lymphadenopathy or easy bruising Neurological:Denies numbness, tingling or new weaknesses. He use a walker but no recent fall Behavioral/Psych: Mood is stable, no new changes  All other  systems were reviewed with the patient and are negative.  PHYSICAL EXAMINATION: ECOG PERFORMANCE STATUS: 1 - Symptomatic but completely ambulatory  Filed Vitals:   10/19/13 1344  BP: 144/53  Pulse: 62  Temp: 97.1 F (36.2 C)  Resp: 21   Filed Weights   10/19/13 1344  Weight: 154 lb 7 oz (70.052 kg)    GENERAL:alert, no distress and comfortable. Patient then in mildly cachectic SKIN: skin color, texture, turgor are normal, he has significant lesions with keratosis throughout EYES: The right conjunctiva are inflamed consistent with conjunctivitis OROPHARYNX: Patient had very poor dentition throughout. No oral thrush or mucositis NECK: supple, thyroid normal size, non-tender, without nodularity LYMPH:  There is a palpable lymph node swelling in the left angle of the mandible. LUNGS: clear to auscultation and percussion with normal breathing effort HEART: regular rate & rhythm and no murmurs and no lower extremity edema ABDOMEN:abdomen soft, non-tender and normal bowel sounds Musculoskeletal:no cyanosis of digits and no clubbing  NEURO: alert & oriented x 3 with fluent speech, no focal motor/sensory deficits  LABORATORY DATA:  I have reviewed the data as listed Results for orders placed in visit on 10/19/13 (from the past 48 hour(s))  COMPREHENSIVE METABOLIC PANEL ([facility]13)     Status: Abnormal   Collection Time    10/19/13  1:09 PM      Result Value Range   Sodium 138  136 - 145 mEq/L   Potassium 4.6  3.5 - 5.1 mEq/L   Chloride 103  98 - 109 mEq/L   CO2 22  22 - 29 mEq/L   Glucose 98  70 - 140 mg/dl   BUN 87.5 (*) 7.0 - 64.3 mg/dL   Creatinine 1.9 (*) 0.7 - 1.3 mg/dL   Total Bilirubin 3.29  0.20 - 1.20 mg/dL   Alkaline Phosphatase 115  40 - 150 U/L   AST 15  5 - 34 U/L   ALT 14  0 - 55 U/L   Total Protein 7.2  6.4 - 8.3 g/dL   Albumin 3.7  3.5 - 5.0 g/dL   Calcium 9.3  8.4 - 51.8 mg/dL   Anion Gap 12 (*) 3 - 11 mEq/L  FERRITIN [facility]     Status: None   Collection Time     10/19/13  1:09 PM      Result Value Range   Ferritin 231  22 - 316 ng/ml  MORPHOLOGY     Status: None   Collection Time    10/19/13  1:09 PM      Result Value Range   Tear Drop Cells Few  Negative   Ovalocytes Few  Negative   Shistocytes Few  Negative   Burr Cells Few  Negative   Helmet Cell Few  Negative   White Cell Comments C/W auto diff     PLT EST Decreased  Adequate  IRON AND TIBC [facility]     Status: None   Collection Time    10/19/13  1:09 PM      Result Value Range   Iron 101  42 - 163 ug/dL   TIBC 161  096 - 045 ug/dL   UIBC 409  811 - 914 ug/dL   %SAT 36  20 - 55 %  CBC & DIFF AND RETIC     Status: Abnormal   Collection Time    10/19/13  1:09 PM      Result Value Range   WBC 5.1  4.0 - 10.3 10e3/uL   NEUT# 3.4  1.5 - 6.5 10e3/uL   HGB 9.8 (*) 13.0 - 17.1 g/dL   HCT 78.2 (*) 95.6 - 21.3 %   Platelets 123 (*) 140 - 400 10e3/uL   MCV 99.3 (*) 79.3 - 98.0 fL   MCH 32.9  27.2 - 33.4 pg   MCHC 33.1  32.0 - 36.0 g/dL   RBC 0.86 (*) 5.78 - 4.69 10e6/uL   RDW 15.2 (*) 11.0 - 14.6 %   lymph# 0.7 (*) 0.9 - 3.3 10e3/uL   MONO# 0.6  0.1 - 0.9 10e3/uL   Eosinophils Absolute 0.3  0.0 - 0.5 10e3/uL   Basophils Absolute 0.0  0.0 - 0.1 10e3/uL   NEUT% 67.7  39.0 - 75.0 %   LYMPH% 14.2  14.0 - 49.0 %   MONO% 12.2  0.0 - 14.0 %   EOS% 5.3  0.0 - 7.0 %   BASO% 0.6  0.0 - 2.0 %   Retic % 0.68 (*) 0.80 - 1.80 %   Retic Ct Abs 20.26 (*) 34.80 - 93.90 10e3/uL   Immature Retic Fract 3.10  3.00 - 10.60 %    I reviewed his peripheral smear. If they are one to 2 schistocytes per high power field. His absolute reduced platelet count. No evidence of platelet clumping. The white blood cell is off normal morphology with no evidence of increased blasts ASSESSMENT:  #1 chronic anemia #2 chronic thrombocytopenia #3 bacterial conjunctivitis #4 port dentition #5 palpable neck lymphadenopathy  PLAN:  #1 chronic anemia I am not convinced that the anemia is related to anemia of chronic  disease from chronic renal failure. The patient had macrocytosis and am concerned about possible undiagnosed myelodysplastic syndrome. I recommend a bone marrow aspirate and biopsy. I discussed with the patient in great detail what is involve and he's in agreement to proceed, to have it done under sedation. I will organize that to be done at the short stay unit at [facility] long. #2 chronic thrombocytopenia This is new onset since July of this year. It could be related to untreated infection but most importantly, am concerned about possible myelodysplastic syndrome. The present time he is not at risk of bleeding. We can just observe for now. He can continue on his chronic anti-coagulation therapy as far as his platelet count stay above 50,000 #3 bacterial conjunctivitis I will go ahead and prescribe a course of topical eyedrops for this #4 poor dentition I recommend a course of oral antibiotics for 10 days for this #5 palpable neck lymphadenopathy It is unclear to me whether this could be related to his poor dentition/abscess or malignancy. I recommend a CT scan of the neck for evaluation and he is in agreement. All questions were answered. The patient knows to call  the clinic with any problems, questions or concerns. No barriers to learning was detected.  I spent 40 minutes counseling the patient face to face. The total time spent in the appointment was 60 minutes and more than 50% was on counseling.     [facility], Zameria Vogl, MD 10/19/2013 2:42 PM

## 2013-10-19 NOTE — Telephone Encounter (Signed)
Gave pt appt for November , gave referral for CT to [facility] for precert

## 2013-10-19 NOTE — Telephone Encounter (Signed)
Called pt notified BMBX scheduled for nov 6 pt to arrive at 0700 in short stay, NPO after midnight, ok to take medications if needed prior to arrival. Request pt call back to confirm message received.

## 2013-10-20 LAB — VITAMIN B12: Vitamin B-12: 1248 pg/mL — ABNORMAL HIGH (ref 211–911)

## 2013-10-20 LAB — SEDIMENTATION RATE: Sed Rate: 13 mm/hr (ref 0–16)

## 2013-10-20 LAB — DIRECT ANTIGLOBULIN TEST (NOT AT ARMC)
DAT (Complement): NEGATIVE
DAT IgG: NEGATIVE

## 2013-10-20 LAB — HAPTOGLOBIN: Haptoglobin: <25 mg/dL — ABNORMAL LOW (ref 45–215)

## 2013-10-21 ENCOUNTER — Telehealth: Payer: Self-pay | Admitting: *Deleted

## 2013-10-21 ENCOUNTER — Ambulatory Visit (HOSPITAL_COMMUNITY)
Admission: RE | Admit: 2013-10-21 | Discharge: 2013-10-21 | Disposition: A | Discharge status: Home / Self Care | Payer: Medicare Other | Source: Ambulatory Visit | Attending: Hematology and Oncology | Admitting: Hematology and Oncology

## 2013-10-21 DIAGNOSIS — E0789 Other specified disorders of thyroid: Secondary | ICD-10-CM | POA: Insufficient documentation

## 2013-10-21 DIAGNOSIS — K119 Disease of salivary gland, unspecified: Secondary | ICD-10-CM | POA: Insufficient documentation

## 2013-10-21 DIAGNOSIS — I658 Occlusion and stenosis of other precerebral arteries: Secondary | ICD-10-CM | POA: Insufficient documentation

## 2013-10-21 DIAGNOSIS — I6529 Occlusion and stenosis of unspecified carotid artery: Secondary | ICD-10-CM | POA: Insufficient documentation

## 2013-10-21 DIAGNOSIS — I700 Atherosclerosis of aorta: Secondary | ICD-10-CM | POA: Insufficient documentation

## 2013-10-21 DIAGNOSIS — R221 Localized swelling, mass and lump, neck: Secondary | ICD-10-CM

## 2013-10-21 NOTE — Telephone Encounter (Signed)
I can do it next Friday in the office at 1130 am on 10/31 If they can make it then we can cancel the sedated at Short stay Please ask the front desk to block my schedule for 30 minutes

## 2013-10-21 NOTE — Telephone Encounter (Signed)
Wife states pt does not want any sedation for his Bone Marrow Biopsy. He has Pulmonary HTN and wants to avoid any sedation if possible.  Prefers local anesthetic only if ok w/ Dr. Bertis Ruddy.   Informed wife will notify Dr Bertis Ruddy and will call her back if any change to his schedule.   Otherwise keep appts as scheduled.  She verbalized understanding.

## 2013-10-21 NOTE — Telephone Encounter (Signed)
BMBx scheduled for 10/31 at [facility].  Pt to arrive at 10:45 am for lab, and 11 am for appt.   Procedure at 11:30 am.  Canceled Bx at Short Stay.  Notified Lupita Leash in [employer].  Notified wife of change of appt time and location for Bx.  She verbalized understanding.

## 2013-10-22 ENCOUNTER — Telehealth: Payer: Self-pay | Admitting: Hematology and Oncology

## 2013-10-22 NOTE — Telephone Encounter (Signed)
Per staff message from desk nurse added hold to NG's scheduled for [facility] 10/31 @ 11:30am. BMBX appt already on inf/lb schedule. Also per staff message from NG ok to moved f/u appt from 11/20 to 2wks after bx (11/14). S/w wife re new d/t for f/u 11/14 and confirmed 10/31 BMBX appt on schedule for 10/31 @ 10:45am.

## 2013-10-27 ENCOUNTER — Ambulatory Visit (INDEPENDENT_AMBULATORY_CARE_PROVIDER_SITE_OTHER): Payer: Medicare Other | Admitting: General Practice

## 2013-10-27 DIAGNOSIS — I4891 Unspecified atrial fibrillation: Secondary | ICD-10-CM

## 2013-10-30 ENCOUNTER — Encounter: Admitting: Hematology and Oncology

## 2013-10-30 ENCOUNTER — Other Ambulatory Visit: Admitting: Lab

## 2013-10-30 ENCOUNTER — Other Ambulatory Visit (HOSPITAL_COMMUNITY)
Admission: RE | Admit: 2013-10-30 | Discharge: 2013-10-30 | Disposition: A | Discharge status: Home / Self Care | Payer: Medicare Other | Source: Ambulatory Visit | Attending: Hematology and Oncology | Admitting: Hematology and Oncology

## 2013-10-30 DIAGNOSIS — D696 Thrombocytopenia, unspecified: Secondary | ICD-10-CM | POA: Insufficient documentation

## 2013-10-30 DIAGNOSIS — D72821 Monocytosis (symptomatic): Secondary | ICD-10-CM | POA: Insufficient documentation

## 2013-10-30 DIAGNOSIS — D72810 Lymphocytopenia: Secondary | ICD-10-CM | POA: Insufficient documentation

## 2013-10-30 DIAGNOSIS — D649 Anemia, unspecified: Secondary | ICD-10-CM

## 2013-10-30 DIAGNOSIS — D539 Nutritional anemia, unspecified: Secondary | ICD-10-CM | POA: Insufficient documentation

## 2013-10-30 LAB — CBC
HCT: 28 % — ABNORMAL LOW (ref 39.0–52.0)
MCH: 33.5 pg (ref 26.0–34.0)
MCHC: 33.2 g/dL (ref 30.0–36.0)
Platelets: 123 10*3/uL — ABNORMAL LOW (ref 150–400)
RDW: 15.6 % — ABNORMAL HIGH (ref 11.5–15.5)

## 2013-10-30 NOTE — Progress Notes (Signed)
Pt tolerated BMBx well.  Bandaid clean, dry and intact to left hip.  D/C instructions given to pt and wife. They verbalized understanding.

## 2013-10-30 NOTE — Patient Instructions (Signed)
[facility] Discharge Instructions for Post Bone Marrow Procedure  Today you had a bone marrow biopsy and aspirate of Left hip.  Please keep the bandaid in place for at least 24 hours.  Have someone check your dressing periodically for bleeding.  If needed you can reapply a pressure dressing to the site.  Take pain medication, tylenol as needed for any discomfort.  IF BLEEDING REOCCURS THAT SHOULD BE REPORTED IMMEDIATELY. Call the Cancer Center at [phone] if during business hours. Or report to the Emergency Room.    I have been informed and understand all the instructions given to me. I know to contact the clinic, my physician, or go to the Emergency Department if any problems should occur. I do not have any questions at this time, but understand that I may call the clinic during office hours at [phone]  should I have any questions or need assistance in obtaining follow up care.    __________________________________________  _____________  __________ Signature of Patient or Authorized Representative            Date                   Time    __________________________________________ Nurse's Signature

## 2013-10-30 NOTE — Progress Notes (Signed)
Brief examination was performed. ENT: adequate airway clearance Heart: regular rate and rhythm.No Murmurs Lungs: clear to auscultation, no wheezes, normal respiratory effort  Bone Marrow Biopsy and Aspiration Procedure Note   Informed consent was obtained and potential risks including bleeding, infection and pain were reviewed with the patient.   The patient's name, date of birth, identification, consent and allergies were verified prior to the start of procedure and time out was performed. The left posterior iliac crest was chosen was the site of biopsy.  The skin was prepped with Betadine solution.   5 cc of 2% lidocaine was used to provide local anaesthesia.   10 cc of bone marrow aspirate was obtained followed by 1 inch biopsy.   The procedure was tolerated well and there were no complications.  The patient was stable at the end of the procedure.  Specimens sent for flow cytometry, cytogenetics and additional studies. This encounter was created in error - please disregard.

## 2013-11-05 ENCOUNTER — Other Ambulatory Visit (HOSPITAL_COMMUNITY): Payer: Medicare Other

## 2013-11-10 LAB — TISSUE HYBRIDIZATION (BONE MARROW)-NCBH

## 2013-11-10 LAB — CHROMOSOME ANALYSIS, BONE MARROW

## 2013-11-11 ENCOUNTER — Other Ambulatory Visit: Payer: Self-pay | Admitting: Internal Medicine

## 2013-11-13 ENCOUNTER — Other Ambulatory Visit: Payer: Self-pay | Admitting: Hematology and Oncology

## 2013-11-13 ENCOUNTER — Ambulatory Visit (HOSPITAL_BASED_OUTPATIENT_CLINIC_OR_DEPARTMENT_OTHER): Payer: Medicare Other | Admitting: Hematology and Oncology

## 2013-11-13 ENCOUNTER — Encounter: Payer: Self-pay | Admitting: Hematology and Oncology

## 2013-11-13 ENCOUNTER — Ambulatory Visit (HOSPITAL_BASED_OUTPATIENT_CLINIC_OR_DEPARTMENT_OTHER): Payer: Medicare Other | Admitting: Lab

## 2013-11-13 VITALS — BP 125/46 | HR 64 | Temp 96.6°F | Resp 19 | Ht 64.0 in | Wt 161.6 lb

## 2013-11-13 DIAGNOSIS — N183 Chronic kidney disease, stage 3 unspecified: Secondary | ICD-10-CM

## 2013-11-13 DIAGNOSIS — D631 Anemia in chronic kidney disease: Secondary | ICD-10-CM

## 2013-11-13 DIAGNOSIS — N039 Chronic nephritic syndrome with unspecified morphologic changes: Secondary | ICD-10-CM

## 2013-11-13 DIAGNOSIS — D696 Thrombocytopenia, unspecified: Secondary | ICD-10-CM

## 2013-11-13 HISTORY — DX: Anemia in chronic kidney disease: D63.1

## 2013-11-13 LAB — CBC WITH DIFFERENTIAL/PLATELET
BASO%: 1.1 % (ref 0.0–2.0)
Basophils Absolute: 0.1 10*3/uL (ref 0.0–0.1)
Eosinophils Absolute: 0.3 10*3/uL (ref 0.0–0.5)
HGB: 8.9 g/dL — ABNORMAL LOW (ref 13.0–17.1)
MCHC: 32.4 g/dL (ref 32.0–36.0)
MCV: 103.1 fL — ABNORMAL HIGH (ref 79.3–98.0)
MONO#: 0.8 10*3/uL (ref 0.1–0.9)
MONO%: 13 % (ref 0.0–14.0)
NEUT#: 4.2 10*3/uL (ref 1.5–6.5)
RBC: 2.67 10*6/uL — ABNORMAL LOW (ref 4.20–5.82)
RDW: 15.9 % — ABNORMAL HIGH (ref 11.0–14.6)
WBC: 5.9 10*3/uL (ref 4.0–10.3)

## 2013-11-13 LAB — COMPREHENSIVE METABOLIC PANEL (CC13)
ALT: 12 U/L (ref 0–55)
AST: 20 U/L (ref 5–34)
Albumin: 3.7 g/dL (ref 3.5–5.0)
Alkaline Phosphatase: 109 U/L (ref 40–150)
BUN: 86.8 mg/dL — ABNORMAL HIGH (ref 7.0–26.0)
CO2: 23 mEq/L (ref 22–29)
Creatinine: 2.1 mg/dL — ABNORMAL HIGH (ref 0.7–1.3)
Glucose: 101 mg/dl (ref 70–140)
Potassium: 4.9 mEq/L (ref 3.5–5.1)
Sodium: 140 mEq/L (ref 136–145)
Total Bilirubin: 0.76 mg/dL (ref 0.20–1.20)
Total Protein: 7 g/dL (ref 6.4–8.3)

## 2013-11-13 MED ORDER — DARBEPOETIN ALFA-POLYSORBATE 300 MCG/0.6ML IJ SOLN
300.0000 ug | Freq: Once | INTRAMUSCULAR | Status: AC
  Administered 2013-11-13: 300 ug via SUBCUTANEOUS
  Filled 2013-11-13: qty 0.6

## 2013-11-13 NOTE — Patient Instructions (Signed)
Myelodysplastic Syndrome °Myelodysplastic syndrome (MDS) is a group of blood diseases that affects new blood cells. This syndrome starts in the bone marrow where new blood cells are made. New blood cells are called immature cells or stem cells. With MDS, some immature cells do not grow into adult blood cells. Immature blood cells cannot live for very long in the body and they die. Over time, immature blood cells crowd out normal adult blood cells. This causes a low blood count. °The bone marrow produces red blood cells which carry oxygen, white blood cells which fight infection, and platelets which help stop bleeding. If you have too few red blood cells, your blood may not have enough oxygen, and you will feel tired. If you do not have enough white blood cells, you might get infections often. If you have too few platelets, you could bruise or bleed easily. °CAUSES °Sometimes, the cause is unknown. Known causes include: °· Being exposed to certain drugs or chemicals.   °· Radiation treatment. This treatment is often given to fight cancer. °Some things make it more likely that MDS will develop. These are called risk factors. They include:   °· Being white (Caucasian).   °· Being male.   °· Being older than 60 years.   °· Having been treated with cancer drugs or radiation.   °· Having been exposed to tobacco smoke, pesticides, lead, mercury, or the benzene chemical.   °SYMPTOMS °As MDS develops, the body reaches a point where it does not have enough adult blood cells. That is when symptoms usually start. Symptoms often develop slowly over time. The type of symptoms people have depends on which blood cells are affected. Symptoms may include:  °· Being tired all the time.   °· Feeling out of breath.   °· Having pale skin.   °· Getting infections often.   °· Having a fever often.   °· Bruising easily.   °· Bleeding that lasts longer than normal.   °· Having tiny spots of bleeding under the skin (petechiae). °DIAGNOSIS °A  physical exam is performed. Blood tests that may be performed include: °· A complete blood count. This test counts the number of red, white, and platelet blood cells.   °· Peripheral blood smear. This test checks the number and type of blood cells. It also checks their size and shape.   °· Bone marrow testing. A small piece of bone marrow is examined under a microscope to look for MDS.   °TREATMENT °There is no cure for MDS. However, treatment is directed at relieving symptoms. Treatment can also slow down the making of immature blood cells. The type of treatment that is best for you depends on the type of MDS you have. Common treatments include:  °· Blood transfusions. °· Medicines that: °· Slow down the number of immature blood cells that are made.   °· Help immature cells develop into adult cells.   °· Make more blood cells.   °· Treat infections (antibiotics). °· Bone marrow stem cell transplant. Your stem cells would be replaced with healthy stem cells from a donor.   °HOME CARE INSTRUCTIONS °· Only take medicine as prescribed by your caregiver. Follow the directions carefully. Do not start a new medicine unless your caregiver says it is okay.   °· Ask your caregiver if it is okay to take aspirin. Aspirin can make bleeding more likely.   °· Avoid dangerous activities to prevent bleeding. Ask your caregiver what activities are safe for you.    °· Check with your caregiver before you have any minor surgery or dental procedure.   °· Wash all fruits and vegetables before   eating them. Make sure meat and eggs are well-cooked. Do not eat raw fish and shellfish.   °· Wash your hands often. You can also use hand sanitizer. Stay away from people who are sick.   °· Keep all your follow-up appointments. °SEEK MEDICAL CARE IF: °· You feel more tired than normal.   °· You think you might have an infection.   °· You have more bruising than usual.   °· You have trouble catching your breath.   °SEEK IMMEDIATE MEDICAL CARE IF:    °· You have an infection that is getting worse.   °· You have bleeding that you cannot stop.   °· You have trouble breathing.   °· You have chest pain.   °· You have a fever. °Document Released: 06/17/2012 Document Reviewed: 06/17/2012 °[employer]® Patient Information ©2014 [employer]. ° °

## 2013-11-13 NOTE — Progress Notes (Signed)
[facility] OFFICE PROGRESS NOTE  Patient Care Team: [name], NP as PCP - General (Internal Medicine) Leslye Peer, MD as Referring Physician (Pulmonary Disease) Rollene Rotunda, MD as Attending Physician (Cardiology) Reather Littler, MD as Attending Physician (Endocrinology)  DIAGNOSIS: Anemia and thrombocytopenia, likely myelodysplastic syndrome  SUMMARY OF ONCOLOGIC HISTORY: This is a patient who has been followed here for many years. He had history of chronic anemia for some time and had a bone marrow aspirate and biopsy done in 2007 that was unremarkable. Due to chronic anemia and chronic kidney disease he was referred to the cancer Center to receive erythropoietin stimulating agents. Due to anemia, he had received one dose of intravenous iron infusion in January 2014 and darbepoetin injection since June of 2014. On 10/30/2013, he had bone marrow aspirate and biopsy INTERVAL HISTORY: Douglas George 79 y.o. male returns for further followup. He denies any recent fever, chills, night sweats or abnormal weight loss The patient denies any recent signs or symptoms of bleeding such as spontaneous epistaxis, hematuria or hematochezia. The patient complained of persistent nonproductive cough. No hemoptysis. The swelling of the neck remain the same. I have reviewed the past medical history, past surgical history, social history and family history with the patient and they are unchanged from previous note.  ALLERGIES:  is allergic to keflex and codeine.  MEDICATIONS:  Current Outpatient Prescriptions  Medication Sig Dispense Refill  . ACCU-CHEK AVIVA PLUS test strip TEST ONCE EACH DAY AS DIRECTED  100 each  0  . acetaminophen (TYLENOL) 500 MG tablet Take 1,000 mg by mouth 2 (two) times daily.      . Calcium Carb-Cholecalciferol (CALCIUM 1000 + D) 1000-800 MG-UNIT TABS Take 1 tablet by mouth daily.       . Cholecalciferol (VITAMIN D) 1000 UNITS capsule Take 1,000 Units by  mouth daily.       . clotrimazole-betamethasone (LOTRISONE) cream       . CRESTOR 10 MG tablet TAKE ONE TABLET BY MOUTH DAILY  30 tablet  5  . doxazosin (CARDURA) 4 MG tablet Take 4 mg by mouth at bedtime.      . fish oil-omega-3 fatty acids 1000 MG capsule Take 1 g by mouth daily.      . furosemide (LASIX) 80 MG tablet TAKE 1 AND 1/2 TABLETS BY MOUTH EVERY DAY X 30 DAYS  45 tablet  6  . glipiZIDE (GLUCOTROL XL) 2.5 MG 24 hr tablet Take 1 tablet (2.5 mg total) by mouth daily.  30 tablet  5  . Glucosamine-Chondroitin (OSTEO BI-FLEX REGULAR STRENGTH PO) Take by mouth.      . levothyroxine (SYNTHROID, LEVOTHROID) 150 MCG tablet Take 150 mcg by mouth daily.       Marland Kitchen losartan (COZAAR) 50 MG tablet TAKE 1 TABLET BY MOUTH DAILY  30 tablet  11  . oxybutynin (DITROPAN) 5 MG tablet Take 5 mg by mouth as needed.        . ramipril (ALTACE) 5 MG capsule Take 1 capsule (5 mg total) by mouth daily.  30 capsule  11  . tobramycin (TOBREX) 0.3 % ophthalmic solution Place 1 drop into the right eye every 6 (six) hours.  5 mL  0  . vitamin B-12 (CYANOCOBALAMIN) 1000 MCG tablet Take 1,000 mcg by mouth daily.      . vitamin C (ASCORBIC ACID) 500 MG tablet Take 500 mg by mouth daily.      Marland Kitchen warfarin (COUMADIN) 2.5 MG tablet TAKE AS  DIRECTED  30 tablet  0   Current Facility-Administered Medications  Medication Dose Route Frequency Provider Last Rate Last Dose  . darbepoetin (ARANESP) injection 300 mcg  300 mcg Subcutaneous Once Artis Delay, MD        REVIEW OF SYSTEMS:   Constitutional: Denies fevers, chills or abnormal weight loss All other systems were reviewed with the patient and are negative.  PHYSICAL EXAMINATION: ECOG PERFORMANCE STATUS: 2 - Symptomatic, <50% confined to bed  Filed Vitals:   11/13/13 1401  BP: 125/46  Pulse: 64  Temp: 96.6 F (35.9 C)  Resp: 19   Filed Weights   11/13/13 1401  Weight: 161 lb 9.6 oz (73.301 kg)    GENERAL:alert, no distress and comfortable Musculoskeletal:no  cyanosis of digits and no clubbing  NEURO: alert & oriented x 3 with fluent speech, no focal motor/sensory deficits  LABORATORY DATA:  I have reviewed the data as listed    Component Value Date/Time   NA 138 10/19/2013 1309   NA 135 09/16/2013 1438   K 4.6 10/19/2013 1309   K 4.5 09/16/2013 1438   CL 103 09/16/2013 1438   CL 104 06/19/2013 1517   CO2 22 10/19/2013 1309   CO2 25 09/16/2013 1438   GLUCOSE 98 10/19/2013 1309   GLUCOSE 111* 09/16/2013 1438   GLUCOSE 90 06/19/2013 1517   BUN 75.7* 10/19/2013 1309   BUN 63* 09/16/2013 1438   CREATININE 1.9* 10/19/2013 1309   CREATININE 1.8* 09/16/2013 1438   CALCIUM 9.3 10/19/2013 1309   CALCIUM 8.9 09/16/2013 1438   PROT 7.2 10/19/2013 1309   PROT 6.8 06/03/2013 1139   ALBUMIN 3.7 10/19/2013 1309   ALBUMIN 3.7 06/03/2013 1139   AST 15 10/19/2013 1309   AST 26 06/03/2013 1139   ALT 14 10/19/2013 1309   ALT 16 06/03/2013 1139   ALKPHOS 115 10/19/2013 1309   ALKPHOS 102 06/03/2013 1139   BILITOT 1.03 10/19/2013 1309   BILITOT 1.5* 06/03/2013 1139    No results found for this basename: SPEP,  UPEP,   kappa and lambda light chains    Lab Results  Component Value Date   WBC 6.4 10/30/2013   NEUTROABS 3.4 10/19/2013   HGB 9.3* 10/30/2013   HCT 28.0* 10/30/2013   MCV 100.7* 10/30/2013   PLT 123* 10/30/2013      Chemistry      Component Value Date/Time   NA 138 10/19/2013 1309   NA 135 09/16/2013 1438   K 4.6 10/19/2013 1309   K 4.5 09/16/2013 1438   CL 103 09/16/2013 1438   CL 104 06/19/2013 1517   CO2 22 10/19/2013 1309   CO2 25 09/16/2013 1438   BUN 75.7* 10/19/2013 1309   BUN 63* 09/16/2013 1438   CREATININE 1.9* 10/19/2013 1309   CREATININE 1.8* 09/16/2013 1438      Component Value Date/Time   CALCIUM 9.3 10/19/2013 1309   CALCIUM 8.9 09/16/2013 1438   ALKPHOS 115 10/19/2013 1309   ALKPHOS 102 06/03/2013 1139   AST 15 10/19/2013 1309   AST 26 06/03/2013 1139   ALT 14 10/19/2013 1309   ALT 16 06/03/2013 1139   BILITOT 1.03 10/19/2013  1309   BILITOT 1.5* 06/03/2013 1139       RADIOGRAPHIC STUDIES: I have personally reviewed the radiological images as listed and agreed with the findings in the report.  CT scan of the neck were reviewed which showed no evidence of abnormal lymphadenopathy. Incidentally, he was found to have nonspecific right-sided  pleural effusion   ASSESSMENT & PLAN:  #1 myelodysplastic syndrome His bone marrow aspirate and biopsy was abnormal. Cytogenetics and FISH analysis for MDS is pending. Overall, I think his condition is consistent with myelodysplastic syndrome. I discussed with the patient and his wife natural history of MDS and given him patient education handout. At present time, his calculated IPS test score is low. Survival is still measured in years #2 anemia This is likely anemia of chronic disease from his chronic renal failure and MDS.. The patient denies recent history of bleeding such as epistaxis, hematuria or hematochezia. He is asymptomatic from the anemia. We will observe for now.  He does not require transfusion now.  I recommend we continue Aranesp injection but to increase the dose and frequency to every 2 weeks. I will see him in 6 weeks. He agreed. #3 thrombocytopenia The cause is due to MDS. It is mild and there is little change compared from previous platelet count. The patient denies recent history of bleeding such as epistaxis, hematuria or hematochezia. He is asymptomatic from the thrombocytopenia. I will observe for now.  he does not require transfusion now. There is no contraindication for him to proceed with his anticoagulation therapy as long as you remain over 50,000 #4 neck mass I reviewed the CT scan of the neck which show no evidence of cancer #5 chronic cough Incidentally, I saw a pleural effusion on the right. He has a pulmonologist evaluation in 3 weeks. I recommend you keep that appointment.    Orders Placed This Encounter  Procedures  . CBC with Differential     Standing Status: Standing     Number of Occurrences: 6     Standing Expiration Date: 11/13/2014  . Comprehensive metabolic panel    Standing Status: Standing     Number of Occurrences: 6     Standing Expiration Date: 11/13/2014   All questions were answered. The patient knows to call the clinic with any problems, questions or concerns. No barriers to learning was detected.    [facility], Douglas Quesinberry, MD 11/13/2013 2:39 PM

## 2013-11-16 ENCOUNTER — Telehealth: Payer: Self-pay | Admitting: Emergency Medicine

## 2013-11-16 NOTE — Telephone Encounter (Signed)
Pt daughter decided to schedule pt an appt for wed with KC. Nothing further needed

## 2013-11-16 NOTE — Telephone Encounter (Signed)
Spoke with patients wife; states that patient had PET scan that showed fluid in bottom of lungs(told this on 11-06-13)-no treatment or change in therapy given to patient. Pt developed a cough on 11-06-13 that seems to be getting worse-productive at times(yellow in color). Has used Mucinex but makes patient feel doped up. Pt had to take BiPAP off last night due to coughing while laying down. Pt denies any fevers.   Allergies  Allergen Reactions  . Keflex [Cephalexin] Other (See Comments)  . Codeine     RB, Please advise.

## 2013-11-18 ENCOUNTER — Ambulatory Visit (INDEPENDENT_AMBULATORY_CARE_PROVIDER_SITE_OTHER): Payer: Medicare Other | Admitting: Pulmonary Disease

## 2013-11-18 ENCOUNTER — Other Ambulatory Visit: Payer: Self-pay | Admitting: Pulmonary Disease

## 2013-11-18 ENCOUNTER — Ambulatory Visit (INDEPENDENT_AMBULATORY_CARE_PROVIDER_SITE_OTHER)
Admission: RE | Admit: 2013-11-18 | Discharge: 2013-11-18 | Disposition: A | Discharge status: Home / Self Care | Payer: Medicare Other | Source: Ambulatory Visit | Attending: Pulmonary Disease | Admitting: Pulmonary Disease

## 2013-11-18 ENCOUNTER — Encounter: Payer: Self-pay | Admitting: Pulmonary Disease

## 2013-11-18 VITALS — BP 132/72 | HR 52 | Temp 98.3°F | Ht 64.0 in | Wt 163.8 lb

## 2013-11-18 DIAGNOSIS — R05 Cough: Secondary | ICD-10-CM

## 2013-11-18 MED ORDER — LEVOFLOXACIN 750 MG PO TABS: 750.0000 mg | ORAL_TABLET | Freq: Every day | ORAL | Status: DC

## 2013-11-18 NOTE — Progress Notes (Signed)
  Subjective:    Patient ID: Douglas George, male    DOB: 14-Apr-1927, 79 y.o.   MRN: [id number]  HPI Patient comes in today for an acute sick visit.  He is followed in the office with a diagnosis of pulmonary hypertension as well as sleep apnea.  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  He has had a recent CT of his neck, and was told that he had some right-sided pleural fluid.  I have briefly reviewed his CT, and it is difficult to appreciate this.  The patient notes increasing cough, and this has been a long-standing problem for him.  It is basically dry in nature, and he does not bring up purulent mucus.  He does not feel chest congestion.  He is having some postnasal drip, and has a history of esophageal dysmotility.  He is also on an ACE inhibitor at this time.   Review of Systems  Constitutional: Negative for fever and unexpected weight change.  HENT: Negative for congestion, dental problem, ear pain, nosebleeds, postnasal drip, rhinorrhea, sinus pressure, sneezing, sore throat and trouble swallowing.   Eyes: Negative for redness and itching.  Respiratory: Positive for cough. Negative for chest tightness, shortness of breath and wheezing.   Cardiovascular: Positive for leg swelling ( 80mg  Lasix--takes add't with incr swelling). Negative for palpitations.  Gastrointestinal: Negative for nausea and vomiting.  Genitourinary: Negative for dysuria.  Musculoskeletal: Negative for joint swelling.  Skin: Negative for rash.  Neurological: Negative for headaches.  Hematological: Does not bruise/bleed easily.  Psychiatric/Behavioral: Negative for dysphoric mood. The patient is not nervous/anxious.        Objective:   Physical Exam Well-developed male in no acute distress Nose without purulence or discharge noted Neck without lymphadenopathy or thyromegaly Chest with crackles in both bases, but otherwise totally clear lung  fields. Cardiac exam with regular rate and rhythm Lower extremities with 1+ edema bilaterally, no cyanosis Alert and oriented, moves all 4 extremities.       Assessment & Plan:

## 2013-11-18 NOTE — Patient Instructions (Signed)
Try zyrtec 10mg  each night at bedtime for a few weeks to see if cough improves. Take prilosec 20mg  otc, and take 2 each am for next 2-3 weeks. Talk to your cardiologist about coming off ramipril.  This is really a medication you should not be on if you have problems with cough. Will check a chest xray today to see if you really have fluid present on lungs.  Will call you with results.

## 2013-11-18 NOTE — Assessment & Plan Note (Signed)
The patient has a worsening cough that sounds more upper airway in origin than lower.  He gives a history for postnasal drip, has a history of esophageal dysmotility, and is also on an ACE inhibitor.  The question has been raised whether he may have a right pleural effusion, and we'll do a plain chest film today.  I have asked him to take an antihistamine at bedtime, and we'll also treat him with a proton pump inhibitor in the mornings.  I've also asked him to discuss with his primary physician or cardiologist coming off of the ramipril/

## 2013-11-19 ENCOUNTER — Ambulatory Visit: Payer: Medicare Other | Admitting: Hematology and Oncology

## 2013-11-20 ENCOUNTER — Ambulatory Visit: Payer: Medicare Other | Admitting: Cardiology

## 2013-11-24 ENCOUNTER — Ambulatory Visit (INDEPENDENT_AMBULATORY_CARE_PROVIDER_SITE_OTHER): Payer: Medicare Other | Admitting: Internal Medicine

## 2013-11-24 ENCOUNTER — Encounter: Payer: Self-pay | Admitting: Internal Medicine

## 2013-11-24 ENCOUNTER — Ambulatory Visit (INDEPENDENT_AMBULATORY_CARE_PROVIDER_SITE_OTHER): Payer: Medicare Other | Admitting: General Practice

## 2013-11-24 VITALS — BP 132/72 | HR 75 | Temp 98.4°F | Wt 153.0 lb

## 2013-11-24 DIAGNOSIS — I4891 Unspecified atrial fibrillation: Secondary | ICD-10-CM

## 2013-11-24 DIAGNOSIS — L03116 Cellulitis of left lower limb: Secondary | ICD-10-CM | POA: Insufficient documentation

## 2013-11-24 DIAGNOSIS — E785 Hyperlipidemia, unspecified: Secondary | ICD-10-CM

## 2013-11-24 DIAGNOSIS — L02419 Cutaneous abscess of limb, unspecified: Secondary | ICD-10-CM

## 2013-11-24 DIAGNOSIS — E119 Type 2 diabetes mellitus without complications: Secondary | ICD-10-CM

## 2013-11-24 MED ORDER — DOXYCYCLINE HYCLATE 100 MG PO TABS: 100.0000 mg | ORAL_TABLET | Freq: Two times a day (BID) | ORAL | Status: DC

## 2013-11-24 NOTE — Assessment & Plan Note (Signed)
Mild to mod, for antibx course,  to f/u any worsening symptoms or concerns 

## 2013-11-24 NOTE — Progress Notes (Signed)
Pre-visit discussion using our clinic review tool. No additional management support is needed unless otherwise documented below in the visit note.  

## 2013-11-24 NOTE — Assessment & Plan Note (Signed)
stable overall by history and exam, recent data reviewed with pt, and pt to continue medical treatment as before,  to f/u any worsening symptoms or concerns Lab Results  Component Value Date   HGBA1C 6.6* 09/16/2013

## 2013-11-24 NOTE — Assessment & Plan Note (Signed)
stable overall by history and exam, recent data reviewed with pt, and pt to continue medical treatment as before,  to f/u any worsening symptoms or concerns Lab Results  Component Value Date   LDLCALC 36 09/16/2013

## 2013-11-24 NOTE — Progress Notes (Signed)
Subjective:    Patient ID: Douglas George, male    DOB: 1927-10-31, 79 y.o.   MRN: [id number]  HPI  Here with new onset worsening LLE red/tender swelling area documented increase in area by duaghter following by making pen marks on the leg, despite recent levaquin, and wtihout high fever, chills, and Pt denies chest pain, increased sob or doe, wheezing, orthopnea, PND, increased LE swelling, palpitations, dizziness or syncope.  Pt denies new neurological symptoms such as new headache, or facial or extremity weakness or numbness   Pt denies polydipsia, polyuria, Overall good compliance with treatment, and good medicine tolerability. Past Medical History  Diagnosis Date  . Edema   . Hearing loss   . SOB (shortness of breath)     WITH WALKING  . Difficulty walking   . Skin change   . Chronic atrial fibrillation   . Diabetes mellitus type II, controlled   . Hyperlipidemia   . Depression   . Ischemic heart disease     remote stenting of the RCA in 1998 with residual LAD disease of 60 to 70% with negative nuclear in 2011  . Enlarged RV (right ventricle)     per nuclear in 2011  . Pulmonary hypertension     per echo in 2011  . VHD (valvular heart disease)     per echo in 2011 with EF 50 to 55%, moderate AI, moderate MR, LAE, RAE, moderate TR and peak PA pressures up to 74mm  . Arthritis   . Diverticulitis   . H/O blood clots 1990's    in L leg (related to being in a cast for surgery)  . Sleep apnea   . Unspecified deficiency anemia   . Thrombocytopenia   . Anal fissure   . Anemia of chronic kidney failure   . Neck mass 10/19/2013  . Anemia in chronic kidney disease(285.21) 11/13/2013   Past Surgical History  Procedure Laterality Date  . Thyroidectomy    . Left wrist surgery    . Achilles tendon repair      Left  . Shrapnel      [place]   . Tonsillectomy and adenoidectomy    . Inguinal hernia repair    . Cataract extraction    . Vasectomy    . Angioplasty    . Colonoscopy   2009    polyps in the past    reports that he quit smoking about 61 years ago. His smoking use included Cigarettes. He has a 2 pack-year smoking history. He has never used smokeless tobacco. He reports that he drinks alcohol. He reports that he does not use illicit drugs. family history includes Aortic aneurysm (age of onset: 41) in his father; Cancer in his son; Diabetes in his brother; Diabetes (age of onset: 79) in his mother; Heart attack (age of onset: 58) in his father; Heart failure (age of onset: 28) in his mother; Hepatitis in his son; Stroke (age of onset: 63) in his father. Allergies  Allergen Reactions  . Keflex [Cephalexin] Other (See Comments)  . Codeine    Current Outpatient Prescriptions on File Prior to Visit  Medication Sig Dispense Refill  . ACCU-CHEK AVIVA PLUS test strip TEST ONCE EACH DAY AS DIRECTED  100 each  0  . acetaminophen (TYLENOL) 500 MG tablet Take 1,000 mg by mouth 2 (two) times daily.      . Calcium Carb-Cholecalciferol (CALCIUM 1000 + D) 1000-800 MG-UNIT TABS Take 1 tablet by mouth daily.       Marland Kitchen  Cholecalciferol (VITAMIN D) 1000 UNITS capsule Take 1,000 Units by mouth daily.       . clotrimazole-betamethasone (LOTRISONE) cream       . CRESTOR 10 MG tablet TAKE ONE TABLET BY MOUTH DAILY  30 tablet  5  . doxazosin (CARDURA) 4 MG tablet Take 4 mg by mouth at bedtime.      . fish oil-omega-3 fatty acids 1000 MG capsule Take 1 g by mouth daily.      . furosemide (LASIX) 80 MG tablet TAKE 1 & 1/2 TABLET UNTIL FURTHER NOTICE (FOR SWELLING)      . glipiZIDE (GLUCOTROL XL) 2.5 MG 24 hr tablet Take 1 tablet (2.5 mg total) by mouth daily.  30 tablet  5  . Glucosamine-Chondroitin (OSTEO BI-FLEX REGULAR STRENGTH PO) Take by mouth.      . levothyroxine (SYNTHROID, LEVOTHROID) 150 MCG tablet Take 150 mcg by mouth daily.       Marland Kitchen losartan (COZAAR) 50 MG tablet TAKE 1 TABLET BY MOUTH DAILY  30 tablet  11  . oxybutynin (DITROPAN) 5 MG tablet Take 5 mg by mouth as needed.         . ramipril (ALTACE) 5 MG capsule Take 1 capsule (5 mg total) by mouth daily.  30 capsule  11  . tobramycin (TOBREX) 0.3 % ophthalmic solution Place 1 drop into the right eye every 6 (six) hours.  5 mL  0  . vitamin B-12 (CYANOCOBALAMIN) 1000 MCG tablet Take 1,000 mcg by mouth daily.      . vitamin C (ASCORBIC ACID) 500 MG tablet Take 500 mg by mouth daily.      Marland Kitchen warfarin (COUMADIN) 2.5 MG tablet TAKE AS DIRECTED  30 tablet  0   No current facility-administered medications on file prior to visit.    Review of Systems  Constitutional: Negative for unexpected weight change, or unusual diaphoresis  HENT: Negative for tinnitus.   Eyes: Negative for photophobia and visual disturbance.  Respiratory: Negative for choking and stridor.   Gastrointestinal: Negative for vomiting and blood in stool.  Genitourinary: Negative for hematuria and decreased urine volume.  Musculoskeletal: Negative for acute joint swelling Skin: Negative for color change and wound.  Neurological: Negative for tremors and numbness other than noted  Psychiatric/Behavioral: Negative for decreased concentration or  hyperactivity.       Objective:   Physical Exam BP 132/72  Pulse 75  Temp(Src) 98.4 F (36.9 C) (Oral)  Wt 153 lb (69.4 kg)  SpO2 94% VS noted,  Constitutional: Pt appears well-developed and well-nourished.  HENT: Head: NCAT.  Right Ear: External ear normal.  Left Ear: External ear normal.  Eyes: Conjunctivae and EOM are normal. Pupils are equal, round, and reactive to light.  Neck: Normal range of motion. Neck supple.  Cardiovascular: Normal rate and regular rhythm.   Pulmonary/Chest: Effort normal and breath sounds normal.  Abd:  Soft, NT, non-distended, + BS Neurological: Pt is alert. Not confused  Skin: LLE with 4-5 cm area red/tender/swelling lateral distal leg approx 2 cm above the ankle, no laceration, wound/ulcer, drainage or red streaks Psychiatric: Pt behavior is normal. Thought content  normal.     Assessment & Plan:

## 2013-11-24 NOTE — Patient Instructions (Addendum)
Please take all new medication as prescribed Please continue all other medications as before  Please remember to sign up for My Chart if you have not done so, as this will be important to you in the future with finding out test results, communicating by private email, and scheduling acute appointments online when needed.  Please return in 3 months, or sooner if needed

## 2013-11-27 ENCOUNTER — Ambulatory Visit (HOSPITAL_BASED_OUTPATIENT_CLINIC_OR_DEPARTMENT_OTHER): Payer: Medicare Other

## 2013-11-27 ENCOUNTER — Other Ambulatory Visit (HOSPITAL_BASED_OUTPATIENT_CLINIC_OR_DEPARTMENT_OTHER): Payer: Medicare Other | Admitting: Lab

## 2013-11-27 VITALS — BP 154/55 | HR 60 | Temp 97.6°F

## 2013-11-27 DIAGNOSIS — D631 Anemia in chronic kidney disease: Secondary | ICD-10-CM

## 2013-11-27 DIAGNOSIS — D696 Thrombocytopenia, unspecified: Secondary | ICD-10-CM

## 2013-11-27 DIAGNOSIS — N183 Chronic kidney disease, stage 3 unspecified: Secondary | ICD-10-CM

## 2013-11-27 DIAGNOSIS — N039 Chronic nephritic syndrome with unspecified morphologic changes: Secondary | ICD-10-CM

## 2013-11-27 LAB — CBC WITH DIFFERENTIAL/PLATELET
Basophils Absolute: 0.1 10*3/uL (ref 0.0–0.1)
HCT: 32.6 % — ABNORMAL LOW (ref 38.4–49.9)
HGB: 10.5 g/dL — ABNORMAL LOW (ref 13.0–17.1)
MCH: 33.7 pg — ABNORMAL HIGH (ref 27.2–33.4)
MCHC: 32.2 g/dL (ref 32.0–36.0)
MCV: 104.6 fL — ABNORMAL HIGH (ref 79.3–98.0)
MONO%: 9.5 % (ref 0.0–14.0)
Platelets: 141 10*3/uL (ref 140–400)

## 2013-11-27 LAB — COMPREHENSIVE METABOLIC PANEL (CC13)
AST: 26 U/L (ref 5–34)
Albumin: 3.7 g/dL (ref 3.5–5.0)
Alkaline Phosphatase: 105 U/L (ref 40–150)
Anion Gap: 13 mEq/L — ABNORMAL HIGH (ref 3–11)
CO2: 26 mEq/L (ref 22–29)
Glucose: 119 mg/dl (ref 70–140)
Potassium: 4.3 mEq/L (ref 3.5–5.1)
Sodium: 143 mEq/L (ref 136–145)
Total Protein: 7.2 g/dL (ref 6.4–8.3)

## 2013-11-27 MED ORDER — DARBEPOETIN ALFA-POLYSORBATE 300 MCG/0.6ML IJ SOLN
300.0000 ug | Freq: Once | INTRAMUSCULAR | Status: AC
  Administered 2013-11-27: 300 ug via SUBCUTANEOUS
  Filled 2013-11-27: qty 0.6

## 2013-12-02 ENCOUNTER — Encounter: Payer: Self-pay | Admitting: Nurse Practitioner

## 2013-12-02 ENCOUNTER — Ambulatory Visit (INDEPENDENT_AMBULATORY_CARE_PROVIDER_SITE_OTHER): Payer: Medicare Other | Admitting: Nurse Practitioner

## 2013-12-02 VITALS — BP 130/70 | HR 58 | Ht 66.0 in | Wt 151.8 lb

## 2013-12-02 DIAGNOSIS — I4891 Unspecified atrial fibrillation: Secondary | ICD-10-CM

## 2013-12-02 DIAGNOSIS — I482 Chronic atrial fibrillation, unspecified: Secondary | ICD-10-CM

## 2013-12-02 NOTE — Patient Instructions (Addendum)
Stop the Ramipril - hopefully this will help your cough resolve  Stay on your current medicines  Try to monitor some BP readings at home  Try to be as active as possible  See me in 4 months  Call the [facility] office at [phone] if you have any questions, problems or concerns.

## 2013-12-02 NOTE — Progress Notes (Signed)
[name] Date of Birth: 1927-06-23 Medical Record #[id number]  History of Present Illness: Cy is seen back today for a 4 month check. Seen for Dr. Antoine Poche - former patient of Dr. Ronnald Nian. He has multiple medical issues which include chronic atrial fib, on coumadin, past alcohol abuse, anemia of chronic disease,myelodysplastic syndrome followed by oncology, DM, HLD, depression, ischemic heart disease with prior stenting of the RCA and a residual 60 to 70% LAD stenosis back in 1998 as well as pulmonary HTN. Last stress test was in 2011 showing RV enlargement with no ischemia and a normal EF. Other problems include hypothyroidism, OA and valvular heart disease. He had his last echo back in 2011 showing an EF of 50 to 55%, moderate AI, moderate MR, LAE, RAE, moderate TR and peak PA pressures up to 74mm Hg. He has been managed conservatively. Remains on CPAP for his OSA.  Last seen here in August by Dr. Antoine Poche. Seemed to be stable from our standpoint.  Comes back today. Here with his wife, [name]. Doing ok. Has had a recent bout of pneumonia and a bout of cellulitis. Now better. No chest pain. Still with some cough and Dr. Delton Coombes wanted to stop his ACE. He is on both ACE and ARB - not sure why. BP is fine. Not very active. No falls.   Current Outpatient Prescriptions  Medication Sig Dispense Refill  . ACCU-CHEK AVIVA PLUS test strip TEST ONCE EACH DAY AS DIRECTED  100 each  0  . acetaminophen (TYLENOL) 500 MG tablet Take 1,000 mg by mouth 2 (two) times daily.      . Calcium Carb-Cholecalciferol (CALCIUM 1000 + D) 1000-800 MG-UNIT TABS Take 1 tablet by mouth daily.       . Cholecalciferol (VITAMIN D) 1000 UNITS capsule Take 1,000 Units by mouth daily.       . clotrimazole-betamethasone (LOTRISONE) cream       . CRESTOR 10 MG tablet TAKE ONE TABLET BY MOUTH DAILY  30 tablet  5  . doxazosin (CARDURA) 4 MG tablet Take 4 mg by mouth at bedtime.      Marland Kitchen doxycycline (VIBRA-TABS) 100 MG tablet  Take 1 tablet (100 mg total) by mouth 2 (two) times daily.  20 tablet  0  . fish oil-omega-3 fatty acids 1000 MG capsule Take 1 g by mouth daily.      . furosemide (LASIX) 80 MG tablet TAKE 1 & 1/2 TABLET UNTIL FURTHER NOTICE (FOR SWELLING)      . glipiZIDE (GLUCOTROL XL) 2.5 MG 24 hr tablet Take 1 tablet (2.5 mg total) by mouth daily.  30 tablet  5  . Glucosamine-Chondroitin (OSTEO BI-FLEX REGULAR STRENGTH PO) Take by mouth.      . levothyroxine (SYNTHROID, LEVOTHROID) 150 MCG tablet Take 150 mcg by mouth daily.       Marland Kitchen losartan (COZAAR) 50 MG tablet TAKE 1 TABLET BY MOUTH DAILY  30 tablet  11  . oxybutynin (DITROPAN) 5 MG tablet Take 5 mg by mouth as needed.        . ramipril (ALTACE) 5 MG capsule Take 1 capsule (5 mg total) by mouth daily.  30 capsule  11  . tobramycin (TOBREX) 0.3 % ophthalmic solution Place 1 drop into the right eye every 6 (six) hours.  5 mL  0  . vitamin B-12 (CYANOCOBALAMIN) 1000 MCG tablet Take 1,000 mcg by mouth daily.      . vitamin C (ASCORBIC ACID) 500 MG tablet Take 500 mg by  mouth daily.      Marland Kitchen warfarin (COUMADIN) 2.5 MG tablet TAKE AS DIRECTED  30 tablet  0   No current facility-administered medications for this visit.    Allergies  Allergen Reactions  . Keflex [Cephalexin] Other (See Comments)  . Codeine     Past Medical History  Diagnosis Date  . Edema   . Hearing loss   . SOB (shortness of breath)     WITH WALKING  . Difficulty walking   . Skin change   . Chronic atrial fibrillation   . Diabetes mellitus type II, controlled   . Hyperlipidemia   . Depression   . Ischemic heart disease     remote stenting of the RCA in 1998 with residual LAD disease of 60 to 70% with negative nuclear in 2011  . Enlarged RV (right ventricle)     per nuclear in 2011  . Pulmonary hypertension     per echo in 2011  . VHD (valvular heart disease)     per echo in 2011 with EF 50 to 55%, moderate AI, moderate MR, LAE, RAE, moderate TR and peak PA pressures up to  74mm  . Arthritis   . Diverticulitis   . H/O blood clots 1990's    in L leg (related to being in a cast for surgery)  . Sleep apnea   . Unspecified deficiency anemia   . Thrombocytopenia   . Anal fissure   . Anemia of chronic kidney failure   . Neck mass 10/19/2013  . Anemia in chronic kidney disease(285.21) 11/13/2013    Past Surgical History  Procedure Laterality Date  . Thyroidectomy    . Left wrist surgery    . Achilles tendon repair      Left  . [place]rapnel      [place]   . Tonsillectomy and adenoidectomy    . Inguinal hernia repair    . Cataract extraction    . Vasectomy    . Angioplasty    . Colonoscopy  2009    polyps in the past    History  Smoking status  . Former Smoker -- 2.00 packs/day for 1 years  . Types: Cigarettes  . Quit date: 01/01/1952  Smokeless tobacco  . Never Used    History  Alcohol Use  . Yes    Comment: 1 glass wine daily    Family History  Problem Relation Age of Onset  . Heart failure Mother 19  . Diabetes Mother 83  . Heart attack Father 84  . Stroke Father 85  . Aortic aneurysm Father 19    ABDOMINAL  . Cancer Son     ?  Marland Kitchen Hepatitis Son   . Diabetes Brother     Review of Systems: The review of systems is per the HPI.  All other systems were reviewed and are negative.  Physical Exam: BP 130/70  Pulse 58  Ht 5\' 6"  (1.676 m)  Wt 151 lb 12.8 oz (68.856 kg)  BMI 24.51 kg/m2  SpO2 98% Patient is very pleasant and in no acute distress. Clearly getting more deconditioned and declining in a general fashion. Skin is warm and dry. Color is normal.  HEENT is unremarkable. Normocephalic/atraumatic. PERRL. Sclera are nonicteric. Neck is supple. No masses. No JVD. Lungs are clear. Cardiac exam shows an irregular rhythm. Rate is ok. Abdomen is soft. Extremities are without edema. Gait and ROM are intact. No gross neurologic deficits noted.  Wt Readings from Last 3 Encounters:  12/02/13  151 lb 12.8 oz (68.856 kg)  11/24/13 153 lb  (69.4 kg)  11/18/13 163 lb 12.8 oz (74.299 kg)    LABORATORY DATA:  Lab Results  Component Value Date   WBC 8.6 11/27/2013   HGB 10.5* 11/27/2013   HCT 32.6* 11/27/2013   PLT 141 11/27/2013   GLUCOSE 119 11/27/2013   CHOL 89 09/16/2013   TRIG 16.0 09/16/2013   HDL 49.80 09/16/2013   LDLCALC 36 09/16/2013   ALT 14 11/27/2013   AST 26 11/27/2013   NA 143 11/27/2013   K 4.3 11/27/2013   CL 103 09/16/2013   CREATININE 2.2* 11/27/2013   BUN 74.6* 11/27/2013   CO2 26 11/27/2013   TSH 2.50 09/16/2013   INR 1.4 11/24/2013   HGBA1C 6.6* 09/16/2013   Lab Results  Component Value Date   INR 1.4 11/24/2013   INR 2.1 10/27/2013   INR 0 09/16/2013     Assessment / Plan:  1. Chronic atrial fib - rate is controlled. Remains on his coumadin. No adverse effects noted.   2. Myelodysplastic syndrome - followed by oncology who has advised that he can continue on his chronic anti-coagulation therapy as far as his platelet count stay above 50,000  3. Chronic dyspnea - seems to be back to his baseline. Has had recent PNA - finishing antibiotics. Will stop his ACE. Continue ARB. I have asked his wife to check some BP's at home.   4. Chronic coumadin therapy - checked at the [facility] office   5. CAD - managed conservatively - no symptoms reported.   He is clearly failing in a generalized fashion. Will continue with supportive care. See him back in 4 months. ACE is stopped today.   Patient is agreeable to this plan and will call if any problems develop in the interim.   Rosalio Macadamia, RN, ANP-C [facility] [address] [place], [place]  [address]

## 2013-12-04 ENCOUNTER — Other Ambulatory Visit: Payer: Medicare Other

## 2013-12-07 ENCOUNTER — Ambulatory Visit (INDEPENDENT_AMBULATORY_CARE_PROVIDER_SITE_OTHER): Payer: Medicare Other | Admitting: Emergency Medicine

## 2013-12-07 ENCOUNTER — Encounter: Payer: Self-pay | Admitting: Emergency Medicine

## 2013-12-07 VITALS — BP 124/76 | HR 52 | Ht 66.0 in | Wt 150.4 lb

## 2013-12-07 DIAGNOSIS — I2789 Other specified pulmonary heart diseases: Secondary | ICD-10-CM

## 2013-12-07 DIAGNOSIS — I272 Pulmonary hypertension, unspecified: Secondary | ICD-10-CM

## 2013-12-07 NOTE — Progress Notes (Signed)
Subjective:    Patient ID: Douglas George, male    DOB: 06-Jun-1927, 79 y.o.   MRN: [id number]  HPI 79 yo former low exposure smoker (2 pk-yrs), hx DM, OSA (dx 15 yrs ago, not on CPAP),  LE DVT (his wife says these were while he was therapeutic on coumadin), HTN with restrictive diastolic dysfxn, CAD (RCA stent '98, LAD dz), moderate AI and MR, RV dilation and PAH by TTE in 2011 (estimated PASP ) and then confirmed on TTE January 2/14 (estimated PASP 86 mmHg). Followed by Dr Antoine Poche for these issues and A fib on coumadin.  His most recent TTE was performed to evaluate LE edema, has improved since lasix was increased 1/16. He denies any dyspnea, although activity is limited by his joint disease.   ROV 03/25/13 -- follows up for secondary PAH in setting untreated OSA, chronic VTE, L sided heart disease (CAD, diastolic dysfxn, valvular disease). He underwent full PFT today >> Mild (but real) AFL without BD response, normal volumes, decrease DLCO. Last visit dsat to 90% after 2 laps on RA, no overt drop to <88. Sleep study done but not yet available.  V/Q read as normal on 03/02/13 (has been on anti-coagulation).  Auto-immune panel is negative (2/25).  He is wondering about whether he should get the R heart cath.   ROV 05/15/13 -- Follows for his secondary PAH with w/u and eval as above. He has had his CPAP titration study, results not yet available. Since last time he unfortunately was admitted in [facility] for UTI and heat exhaustion.   ROV 07/1913 -- secondary PAH identified on TTE last done 2/14. He underwent PSG, has started auto-set BiPAP (5-25), . Returns for follow up. Compliance data is available > shows that he is using > 4 hours 73 % of the time, used it every night. Data recommends starting with 16/12 but there was significant leakage. (SLE 1006 is mask fitting). He feels much better since starting the NIPPV. Less exertional SOB, no longer napping.   ROV 12/07/13 -- secondary PAH, OSA on BiPAP with  moderate compliance. Was seen last month by Dr Shelle Iron with persistent UA cough. ? The inciting event, was treated w levaquin, seemed to be driven by his ramipril. His breathing is better, has improved since his diuretics were increased at Cardiology.     Review of Systems  Constitutional: Positive for unexpected weight change. Negative for fever.  HENT: Positive for trouble swallowing. Negative for congestion, dental problem, ear pain, nosebleeds, postnasal drip, rhinorrhea, sinus pressure, sneezing and sore throat.   Eyes: Negative for redness and itching.  Respiratory: Positive for cough. Negative for chest tightness, shortness of breath and wheezing.   Cardiovascular: Negative for palpitations and leg swelling.  Gastrointestinal: Negative for nausea and vomiting.  Genitourinary: Negative for dysuria.  Musculoskeletal: Negative for joint swelling.  Skin: Negative for rash.  Neurological: Negative for headaches.  Hematological: Does not bruise/bleed easily.  Psychiatric/Behavioral: Negative for dysphoric mood. The patient is not nervous/anxious.     V/Q scan 03/02/13 --  Findings: The ventilation scan is normal. No ventilation defects.  The perfusion lung scan is normal. No segmental or subsegmental  defects to suggest pulmonary embolism.  IMPRESSION:  Normal ventilation perfusion lung scan       Objective:   Physical Exam Filed Vitals:   12/07/13 1337  BP: 124/76  Pulse: 52  Gen: Pleasant, elderly somewhat debilitated, in no distress,  normal affect, uses walker  ENT: No lesions,  mouth clear,  Single prominent telangectasia underneath tongue  Neck: No JVD, no TMG, no carotid bruits  Lungs: No use of accessory muscles, no dullness to percussion, clear without rales or rhonchi  Cardiovascular: irregular, heart sounds normal, no murmur or gallops,   Musculoskeletal: No deformities, no cyanosis or clubbing  Neuro: alert, non focal  Skin: Warm, no lesions or rashes, no  edema.   TTE 01/16/13 --  - Left ventricle: The cavity size was normal. Wall thickness was normal. Systolic function was normal. The estimated ejection fraction was in the range of 55% to 60%. Wall motion was normal; there were no regional wall motion abnormalities. Doppler parameters are consistent with restrictive physiology, indicative of decreased left ventricular diastolic compliance and/or increased left atrial pressure. - Aortic valve: Trileaflet; moderately calcified leaflets. There was no stenosis. Mild regurgitation. - Ascending aorta: The visualized portion of the ascending aorta was dilated to 4.1 cm. - Mitral valve: Mildly calcified annulus. There was no evidence for stenosis. Mild regurgitation. - Left atrium: The atrium was severely dilated. - Right ventricle: D-shaped interventricular septum suggestive RV pressure and volume overload. The cavity size was moderately dilated. Systolic function was mildly reduced. - Right atrium: The atrium was severely dilated. - Tricuspid valve: Peak RV-RA gradient: 66mm Hg (S). - Pulmonary arteries: PA peak pressure: 86mm Hg (S). - Systemic veins: IVC dilated to 3.8 cm with no respirophasic variation, suggesting RA pressure 20 mmHg. Impressions:  - Normal LV size and systolic function, EF 55-60%. Restrictive diastolic function. Moderately dilated RV with mildly decreased systolic function. Severe pulmonary hypertension. Dilated IVC suggesting elevated RV filling pressure.      Assessment & Plan:  Pulmonary hypertension Severe secondary PAH due to OSA, HTN and diastolic dysfxn.  - continue BiPAP qhs - he has adjusted his diuretic regimen per L. Gerhardt / Dr Antoine Poche - could consider going back on ramipril or ARB now that resp infxn cleared; I will leave this to cardiology to decide if he needs.  - rov 4 months

## 2013-12-07 NOTE — Assessment & Plan Note (Signed)
Severe secondary PAH due to OSA, HTN and diastolic dysfxn.  - continue BiPAP qhs - he has adjusted his diuretic regimen per L. Gerhardt / Dr Antoine Poche - could consider going back on ramipril or ARB now that resp infxn cleared; I will leave this to cardiology to decide if he needs.  - rov 4 months

## 2013-12-07 NOTE — Patient Instructions (Signed)
Please work on wearing your BiPAP all night for as long as you can Continue your diuretics as adjusted by Cardiology.  Follow with Dr Delton Coombes in 4 months or sooner if you have any problems.

## 2013-12-09 ENCOUNTER — Ambulatory Visit: Payer: Medicare Other | Admitting: Endocrinology

## 2013-12-11 ENCOUNTER — Ambulatory Visit (HOSPITAL_BASED_OUTPATIENT_CLINIC_OR_DEPARTMENT_OTHER): Payer: Medicare Other | Admitting: Hematology and Oncology

## 2013-12-11 ENCOUNTER — Other Ambulatory Visit (HOSPITAL_BASED_OUTPATIENT_CLINIC_OR_DEPARTMENT_OTHER): Payer: Medicare Other

## 2013-12-11 ENCOUNTER — Ambulatory Visit: Payer: Medicare Other

## 2013-12-11 ENCOUNTER — Encounter: Payer: Self-pay | Admitting: Hematology and Oncology

## 2013-12-11 VITALS — BP 144/53 | HR 75 | Temp 97.7°F | Resp 17 | Ht 66.0 in | Wt 150.0 lb

## 2013-12-11 DIAGNOSIS — D696 Thrombocytopenia, unspecified: Secondary | ICD-10-CM

## 2013-12-11 DIAGNOSIS — D649 Anemia, unspecified: Secondary | ICD-10-CM

## 2013-12-11 DIAGNOSIS — D469 Myelodysplastic syndrome, unspecified: Secondary | ICD-10-CM

## 2013-12-11 DIAGNOSIS — D631 Anemia in chronic kidney disease: Secondary | ICD-10-CM

## 2013-12-11 DIAGNOSIS — D539 Nutritional anemia, unspecified: Secondary | ICD-10-CM

## 2013-12-11 HISTORY — DX: Myelodysplastic syndrome, unspecified: D46.9

## 2013-12-11 LAB — COMPREHENSIVE METABOLIC PANEL (CC13)
ALT: 11 U/L (ref 0–55)
AST: 24 U/L (ref 5–34)
Alkaline Phosphatase: 95 U/L (ref 40–150)
Anion Gap: 11 mEq/L (ref 3–11)
CO2: 23 mEq/L (ref 22–29)
Sodium: 135 mEq/L — ABNORMAL LOW (ref 136–145)
Total Bilirubin: 0.87 mg/dL (ref 0.20–1.20)
Total Protein: 7.3 g/dL (ref 6.4–8.3)

## 2013-12-11 LAB — CBC WITH DIFFERENTIAL/PLATELET
BASO%: 0.5 % (ref 0.0–2.0)
LYMPH%: 13.2 % — ABNORMAL LOW (ref 14.0–49.0)
MCHC: 32.6 g/dL (ref 32.0–36.0)
MCV: 102.2 fL — ABNORMAL HIGH (ref 79.3–98.0)
MONO#: 1 10*3/uL — ABNORMAL HIGH (ref 0.1–0.9)
MONO%: 14.7 % — ABNORMAL HIGH (ref 0.0–14.0)
Platelets: 141 10*3/uL (ref 140–400)
RBC: 3.42 10*6/uL — ABNORMAL LOW (ref 4.20–5.82)
WBC: 6.6 10*3/uL (ref 4.0–10.3)
lymph#: 0.9 10*3/uL (ref 0.9–3.3)

## 2013-12-11 NOTE — Progress Notes (Signed)
D1 was the you in the [facility] OFFICE PROGRESS NOTE  Patient Care Team: Corwin Levins, MD as PCP - General (Internal Medicine) Leslye Peer, MD as Referring Physician (Pulmonary Disease) Rollene Rotunda, MD as Attending Physician (Cardiology) Reather Littler, MD as Attending Physician (Endocrinology)  DIAGNOSIS: Anemia and thrombocytopenia, likely myelodysplastic syndrome on erythropoietin stimulating agents  SUMMARY OF ONCOLOGIC HISTORY: This is a patient who has been followed here for many years. He had history of chronic anemia for some time and had a bone marrow aspirate and biopsy done in 2007 that was unremarkable. Due to chronic anemia and chronic kidney disease he was referred to the cancer Center to receive erythropoietin stimulating agents. Due to anemia, he had received one dose of intravenous iron infusion in January 2014 and darbepoetin injection since June of 2014. On 10/30/2013, he had bone marrow aspirate and biopsy showed dyspoietic changes suggested for early myelodysplastic syndrome. We resume erythropoietin stimulating agents every other week to keep hemoglobin above 10 g  INTERVAL HISTORY: Douglas George 79 y.o. male returns for further followup. When I saw the patient last, he was diagnosed with pneumonia and cellulitis. He just completed a course of antibiotic treatment. The patient denies any recent signs or symptoms of bleeding such as spontaneous epistaxis, hematuria or hematochezia. He denies any signs or symptoms of anemia such as shortness of breath, dizziness, headaches or chest pain  I have reviewed the past medical history, past surgical history, social history and family history with the patient and they are unchanged from previous note.  ALLERGIES:  is allergic to keflex and codeine.  MEDICATIONS:  Current Outpatient Prescriptions  Medication Sig Dispense Refill  . ACCU-CHEK AVIVA PLUS test strip TEST ONCE EACH DAY AS DIRECTED  100 each  0   . acetaminophen (TYLENOL) 500 MG tablet Take 1,000 mg by mouth 2 (two) times daily.      . Calcium Carb-Cholecalciferol (CALCIUM 1000 + D) 1000-800 MG-UNIT TABS Take 1 tablet by mouth daily.       . Cholecalciferol (VITAMIN D) 1000 UNITS capsule Take 1,000 Units by mouth daily.       . clotrimazole-betamethasone (LOTRISONE) cream       . CRESTOR 10 MG tablet TAKE ONE TABLET BY MOUTH DAILY  30 tablet  5  . doxazosin (CARDURA) 4 MG tablet Take 4 mg by mouth at bedtime.      . fish oil-omega-3 fatty acids 1000 MG capsule Take 1 g by mouth daily.      . furosemide (LASIX) 80 MG tablet TAKE 1 tablet daily      . glipiZIDE (GLUCOTROL XL) 2.5 MG 24 hr tablet Take 1 tablet (2.5 mg total) by mouth daily.  30 tablet  5  . Glucosamine-Chondroitin (OSTEO BI-FLEX REGULAR STRENGTH PO) Take by mouth.      . levothyroxine (SYNTHROID, LEVOTHROID) 150 MCG tablet Take 150 mcg by mouth daily.       Marland Kitchen losartan (COZAAR) 50 MG tablet TAKE 1 TABLET BY MOUTH DAILY  30 tablet  11  . oxybutynin (DITROPAN) 5 MG tablet Take 5 mg by mouth as needed.        . tobramycin (TOBREX) 0.3 % ophthalmic solution Place 1 drop into the right eye every 6 (six) hours.  5 mL  0  . vitamin B-12 (CYANOCOBALAMIN) 1000 MCG tablet Take 1,000 mcg by mouth daily.      . vitamin C (ASCORBIC ACID) 500 MG tablet Take 500 mg by  mouth daily.      Marland Kitchen warfarin (COUMADIN) 2.5 MG tablet TAKE AS DIRECTED  30 tablet  0   No current facility-administered medications for this visit.    REVIEW OF SYSTEMS:   Constitutional: Denies fevers, chills or abnormal weight loss Eyes: Denies blurriness of vision Ears, nose, mouth, throat, and face: Denies mucositis or sore throat Respiratory: Denies cough, dyspnea or wheezes Cardiovascular: Denies palpitation, chest discomfort or lower extremity swelling Gastrointestinal:  Denies nausea, heartburn or change in bowel habits Skin: Denies abnormal skin rashes Lymphatics: Denies new lymphadenopathy or easy  bruising Neurological:Denies numbness, tingling or new weaknesses Behavioral/Psych: Mood is stable, no new changes  All other systems were reviewed with the patient and are negative.  PHYSICAL EXAMINATION: ECOG PERFORMANCE STATUS: 1 - Symptomatic but completely ambulatory  Filed Vitals:   12/11/13 1215  BP: 144/53  Pulse: 75  Temp: 97.7 F (36.5 C)  Resp: 17   Filed Weights   12/11/13 1215  Weight: 150 lb (68.04 kg)    GENERAL:alert, no distress and comfortable SKIN: skin color, texture, turgor are normal, no rashes or significant lesions NEURO: alert & oriented x 3 with fluent speech, no focal motor/sensory deficits  LABORATORY DATA:  I have reviewed the data as listed    Component Value Date/Time   NA 143 11/27/2013 1504   NA 135 09/16/2013 1438   K 4.3 11/27/2013 1504   K 4.5 09/16/2013 1438   CL 103 09/16/2013 1438   CL 104 06/19/2013 1517   CO2 26 11/27/2013 1504   CO2 25 09/16/2013 1438   GLUCOSE 119 11/27/2013 1504   GLUCOSE 111* 09/16/2013 1438   GLUCOSE 90 06/19/2013 1517   BUN 74.6* 11/27/2013 1504   BUN 63* 09/16/2013 1438   CREATININE 2.2* 11/27/2013 1504   CREATININE 1.8* 09/16/2013 1438   CALCIUM 9.3 11/27/2013 1504   CALCIUM 8.9 09/16/2013 1438   PROT 7.2 11/27/2013 1504   PROT 6.8 06/03/2013 1139   ALBUMIN 3.7 11/27/2013 1504   ALBUMIN 3.7 06/03/2013 1139   AST 26 11/27/2013 1504   AST 26 06/03/2013 1139   ALT 14 11/27/2013 1504   ALT 16 06/03/2013 1139   ALKPHOS 105 11/27/2013 1504   ALKPHOS 102 06/03/2013 1139   BILITOT 1.01 11/27/2013 1504   BILITOT 1.5* 06/03/2013 1139    No results found for this basename: SPEP,  UPEP,   kappa and lambda light chains    Lab Results  Component Value Date   WBC 6.6 12/11/2013   NEUTROABS 4.3 12/11/2013   HGB 11.4* 12/11/2013   HCT 34.9* 12/11/2013   MCV 102.2* 12/11/2013   PLT 141 12/11/2013      Chemistry      Component Value Date/Time   NA 143 11/27/2013 1504   NA 135 09/16/2013 1438   K 4.3 11/27/2013 1504    K 4.5 09/16/2013 1438   CL 103 09/16/2013 1438   CL 104 06/19/2013 1517   CO2 26 11/27/2013 1504   CO2 25 09/16/2013 1438   BUN 74.6* 11/27/2013 1504   BUN 63* 09/16/2013 1438   CREATININE 2.2* 11/27/2013 1504   CREATININE 1.8* 09/16/2013 1438      Component Value Date/Time   CALCIUM 9.3 11/27/2013 1504   CALCIUM 8.9 09/16/2013 1438   ALKPHOS 105 11/27/2013 1504   ALKPHOS 102 06/03/2013 1139   AST 26 11/27/2013 1504   AST 26 06/03/2013 1139   ALT 14 11/27/2013 1504   ALT 16 06/03/2013 1139  BILITOT 1.01 11/27/2013 1504   BILITOT 1.5* 06/03/2013 1139      ASSESSMENT & PLAN:  #1 myelodysplastic syndrome His bone marrow aspirate and biopsy was abnormal. At present time, his calculated IPS test score is low. Survival is still measured in years. I recommend we continue erythropoietin stimulating agents #2 anemia This is likely anemia of chronic disease from his chronic renal failure and MDS. The patient denies recent history of bleeding such as epistaxis, hematuria or hematochezia. He is asymptomatic from the anemia. We will observe for now.  He does not require transfusion now.  I recommend we continue Aranesp injection but to increase the dose and frequency to every 2 weeks.  Goal of therapy is to keep hemoglobin above 10 g #3 thrombocytopenia, resolved The cause is due to MDS. All questions were answered. The patient knows to call the clinic with any problems, questions or concerns. No barriers to learning was detected. I spent 15 minutes counseling the patient face to face. The total time spent in the appointment was 20 minutes and more than 50% was on counseling and review of test results     [facility], Zauria Dombek, MD 12/11/2013 12:46 PM

## 2013-12-14 ENCOUNTER — Telehealth: Payer: Self-pay | Admitting: Hematology and Oncology

## 2013-12-14 NOTE — Telephone Encounter (Signed)
s.w. pt wife and gv all appt DEC thru Feb...pt ok and aware

## 2013-12-15 ENCOUNTER — Other Ambulatory Visit: Payer: Self-pay | Admitting: Internal Medicine

## 2013-12-16 ENCOUNTER — Ambulatory Visit: Payer: Medicare Other | Admitting: Nurse Practitioner

## 2013-12-16 ENCOUNTER — Ambulatory Visit: Payer: Medicare Other | Admitting: Endocrinology

## 2013-12-17 ENCOUNTER — Ambulatory Visit: Payer: Medicare Other | Admitting: Cardiology

## 2013-12-22 ENCOUNTER — Other Ambulatory Visit: Payer: Self-pay | Admitting: Cardiovascular Disease

## 2013-12-22 ENCOUNTER — Ambulatory Visit: Payer: Medicare Other | Admitting: Endocrinology

## 2013-12-25 ENCOUNTER — Ambulatory Visit

## 2013-12-25 ENCOUNTER — Other Ambulatory Visit (HOSPITAL_BASED_OUTPATIENT_CLINIC_OR_DEPARTMENT_OTHER): Payer: Medicare Other

## 2013-12-25 DIAGNOSIS — D469 Myelodysplastic syndrome, unspecified: Secondary | ICD-10-CM

## 2013-12-25 DIAGNOSIS — D696 Thrombocytopenia, unspecified: Secondary | ICD-10-CM

## 2013-12-25 DIAGNOSIS — D631 Anemia in chronic kidney disease: Secondary | ICD-10-CM

## 2013-12-25 LAB — COMPREHENSIVE METABOLIC PANEL (CC13)
ALT: 19 U/L (ref 0–55)
AST: 27 U/L (ref 5–34)
Anion Gap: 11 mEq/L (ref 3–11)
BUN: 80.1 mg/dL — ABNORMAL HIGH (ref 7.0–26.0)
CO2: 23 mEq/L (ref 22–29)
Calcium: 8.8 mg/dL (ref 8.4–10.4)
Chloride: 104 mEq/L (ref 98–109)
Creatinine: 1.9 mg/dL — ABNORMAL HIGH (ref 0.7–1.3)
Glucose: 154 mg/dl — ABNORMAL HIGH (ref 70–140)
Potassium: 4.9 mEq/L (ref 3.5–5.1)
Total Bilirubin: 0.84 mg/dL (ref 0.20–1.20)

## 2013-12-25 LAB — CBC WITH DIFFERENTIAL/PLATELET
BASO%: 1.4 % (ref 0.0–2.0)
Basophils Absolute: 0.1 10*3/uL (ref 0.0–0.1)
EOS%: 6.5 % (ref 0.0–7.0)
HCT: 34.7 % — ABNORMAL LOW (ref 38.4–49.9)
HGB: 10.7 g/dL — ABNORMAL LOW (ref 13.0–17.1)
LYMPH%: 14.6 % (ref 14.0–49.0)
MCH: 31.6 pg (ref 27.2–33.4)
MCHC: 31 g/dL — ABNORMAL LOW (ref 32.0–36.0)
MCV: 102 fL — ABNORMAL HIGH (ref 79.3–98.0)
NEUT%: 64.1 % (ref 39.0–75.0)
Platelets: 136 10*3/uL — ABNORMAL LOW (ref 140–400)
RDW: 14.8 % — ABNORMAL HIGH (ref 11.0–14.6)

## 2013-12-25 MED ORDER — DARBEPOETIN ALFA-POLYSORBATE 300 MCG/0.6ML IJ SOLN
300.0000 ug | Freq: Once | INTRAMUSCULAR | Status: DC
  Filled 2013-12-25: qty 0.6

## 2013-12-29 ENCOUNTER — Ambulatory Visit: Payer: Medicare Other | Admitting: Endocrinology

## 2013-12-30 ENCOUNTER — Ambulatory Visit: Payer: Medicare Other | Admitting: Endocrinology

## 2014-01-05 ENCOUNTER — Ambulatory Visit (INDEPENDENT_AMBULATORY_CARE_PROVIDER_SITE_OTHER): Payer: Medicare Other | Admitting: General Practice

## 2014-01-05 DIAGNOSIS — I4891 Unspecified atrial fibrillation: Secondary | ICD-10-CM

## 2014-01-05 LAB — POCT INR: INR: 1.3

## 2014-01-05 NOTE — Progress Notes (Signed)
Pre-visit discussion using our clinic review tool. No additional management support is needed unless otherwise documented below in the visit note.  

## 2014-01-08 ENCOUNTER — Ambulatory Visit: Payer: Medicare Other

## 2014-01-08 ENCOUNTER — Other Ambulatory Visit (HOSPITAL_BASED_OUTPATIENT_CLINIC_OR_DEPARTMENT_OTHER): Payer: Medicare Other

## 2014-01-08 ENCOUNTER — Ambulatory Visit

## 2014-01-08 DIAGNOSIS — N183 Chronic kidney disease, stage 3 (moderate): Principal | ICD-10-CM

## 2014-01-08 DIAGNOSIS — D469 Myelodysplastic syndrome, unspecified: Secondary | ICD-10-CM

## 2014-01-08 DIAGNOSIS — D631 Anemia in chronic kidney disease: Secondary | ICD-10-CM

## 2014-01-08 DIAGNOSIS — N039 Chronic nephritic syndrome with unspecified morphologic changes: Secondary | ICD-10-CM

## 2014-01-08 DIAGNOSIS — D539 Nutritional anemia, unspecified: Secondary | ICD-10-CM

## 2014-01-08 DIAGNOSIS — D696 Thrombocytopenia, unspecified: Secondary | ICD-10-CM

## 2014-01-08 LAB — CBC WITH DIFFERENTIAL/PLATELET
BASO%: 0.9 % (ref 0.0–2.0)
Basophils Absolute: 0.1 10*3/uL (ref 0.0–0.1)
EOS%: 8.2 % — AB (ref 0.0–7.0)
Eosinophils Absolute: 0.5 10*3/uL (ref 0.0–0.5)
HEMATOCRIT: 32.1 % — AB (ref 38.4–49.9)
HGB: 10.6 g/dL — ABNORMAL LOW (ref 13.0–17.1)
LYMPH#: 1 10*3/uL (ref 0.9–3.3)
LYMPH%: 17.8 % (ref 14.0–49.0)
MCH: 33.1 pg (ref 27.2–33.4)
MCHC: 33.1 g/dL (ref 32.0–36.0)
MCV: 100 fL — ABNORMAL HIGH (ref 79.3–98.0)
MONO#: 0.7 10*3/uL (ref 0.1–0.9)
MONO%: 12.4 % (ref 0.0–14.0)
NEUT#: 3.5 10*3/uL (ref 1.5–6.5)
NEUT%: 60.7 % (ref 39.0–75.0)
PLATELETS: 115 10*3/uL — AB (ref 140–400)
RBC: 3.21 10*6/uL — AB (ref 4.20–5.82)
RDW: 15.2 % — ABNORMAL HIGH (ref 11.0–14.6)
WBC: 5.8 10*3/uL (ref 4.0–10.3)

## 2014-01-08 LAB — COMPREHENSIVE METABOLIC PANEL (CC13)
ALT: 20 U/L (ref 0–55)
AST: 18 U/L (ref 5–34)
Albumin: 3.6 g/dL (ref 3.5–5.0)
Alkaline Phosphatase: 116 U/L (ref 40–150)
Anion Gap: 11 mEq/L (ref 3–11)
BILIRUBIN TOTAL: 0.56 mg/dL (ref 0.20–1.20)
BUN: 79.8 mg/dL — ABNORMAL HIGH (ref 7.0–26.0)
CO2: 23 mEq/L (ref 22–29)
CREATININE: 1.9 mg/dL — AB (ref 0.7–1.3)
Calcium: 9.1 mg/dL (ref 8.4–10.4)
Chloride: 105 mEq/L (ref 98–109)
Glucose: 115 mg/dl (ref 70–140)
Potassium: 4.7 mEq/L (ref 3.5–5.1)
Sodium: 139 mEq/L (ref 136–145)
Total Protein: 7.3 g/dL (ref 6.4–8.3)

## 2014-01-08 MED ORDER — DARBEPOETIN ALFA-POLYSORBATE 300 MCG/0.6ML IJ SOLN
300.0000 ug | Freq: Once | INTRAMUSCULAR | Status: DC
  Filled 2014-01-08: qty 0.6

## 2014-01-11 ENCOUNTER — Encounter: Payer: Self-pay | Admitting: Endocrinology

## 2014-01-11 ENCOUNTER — Ambulatory Visit (INDEPENDENT_AMBULATORY_CARE_PROVIDER_SITE_OTHER): Payer: Medicare Other | Admitting: Endocrinology

## 2014-01-11 VITALS — BP 130/84 | HR 61 | Temp 98.5°F | Resp 14 | Ht 64.0 in | Wt 150.9 lb

## 2014-01-11 DIAGNOSIS — N183 Chronic kidney disease, stage 3 unspecified: Secondary | ICD-10-CM | POA: Insufficient documentation

## 2014-01-11 DIAGNOSIS — R609 Edema, unspecified: Secondary | ICD-10-CM

## 2014-01-11 DIAGNOSIS — E119 Type 2 diabetes mellitus without complications: Secondary | ICD-10-CM

## 2014-01-11 DIAGNOSIS — E039 Hypothyroidism, unspecified: Secondary | ICD-10-CM

## 2014-01-11 NOTE — Progress Notes (Signed)
Patient ID: Douglas George, male   DOB: 12-31-1927, 80 y.o.   MRN: [id number]   Reason for Appointment: Diabetes follow-up   History of Present Illness   Diagnosis: Type 2 DIABETES MELITUS, date of diagnosis:  2003    Previous history: He has had mild diabetes which is consistently well controlled. Previously had been on Actos which was stopped because of potentially worsening his edema. He has been on low-dose glipizide ER subsequently  Recent history: Blood sugars still appear to be well controlled; again he is checking readings after evening meal only and  these are generally fairly good. Occasionally he may check with an hour after eating No hypoglycemia with taking glipizide ER     Oral hypoglycemic drugs: Glipizide ER. Side effects from medications: None       Monitors blood glucose: Once a day.    Glucometer: One Touch.          Blood Glucose readings from meter download: readings before breakfast: Not checked. Evening 99-181 with average 136  Hypoglycemia:  none         Meals: 3 meals per day.  usually trying to restrict carbohydrates         Physical activity:  minimal, unable to do much           Complications: are: None    Wt Readings from Last 3 Encounters:  01/11/14 150 lb 14.4 oz (68.448 kg)  12/11/13 150 lb (68.04 kg)  12/07/13 150 lb 6.4 oz (68.221 kg)   Lab Results  Component Value Date   HGBA1C 6.6* 09/16/2013   Lab Results  Component Value Date   LDLCALC 36 09/16/2013   CREATININE 1.9* 01/08/2014    PROBLEM 2: Chronic leg edema: This is mostly from venous insufficiency. He is consistent with wearing his elastic stockings Also has been on chronic diuretic, now taking Lasix 80 mg daily. He sometimes will increase the dose on his own if edema is consistently worse   HYPERTENSION: He has had long-standing hypertension. Is checking blood pressure at home at times, about 135/65   His blood pressure is somewhat difficult to auscultate and may be unequal in the 2  arms.  Currently on low-dose Cozaar from cardiologist, also taking 4 mg Cardura      Medication List       This list is accurate as of: 01/11/14 11:59 PM.  Always use your most recent med list.               ACCU-CHEK AVIVA PLUS test strip  Generic drug:  glucose blood  TEST ONCE EACH DAY AS DIRECTED     acetaminophen 500 MG tablet  Commonly known as:  TYLENOL  Take 1,000 mg by mouth 2 (two) times daily.     CALCIUM 1000 + D 1000-800 MG-UNIT Tabs  Generic drug:  Calcium Carb-Cholecalciferol  Take 1 tablet by mouth daily.     clotrimazole-betamethasone cream  Commonly known as:  LOTRISONE     CRESTOR 10 MG tablet  Generic drug:  rosuvastatin  TAKE ONE TABLET BY MOUTH DAILY     fish oil-omega-3 fatty acids 1000 MG capsule  Take 1 g by mouth daily.     furosemide 80 MG tablet  Commonly known as:  LASIX  TAKE 1 tablet daily     glipiZIDE 2.5 MG 24 hr tablet  Commonly known as:  GLUCOTROL XL  Take 1 tablet (2.5 mg total) by mouth daily.     levothyroxine 150  MCG tablet  Commonly known as:  SYNTHROID, LEVOTHROID  Take 150 mcg by mouth daily.     losartan 50 MG tablet  Commonly known as:  COZAAR  TAKE 1 TABLET BY MOUTH EVERY DAY     Magnesium 300 MG Caps  Take 300 mg by mouth.     OSTEO BI-FLEX REGULAR STRENGTH PO  Take by mouth.     oxybutynin 5 MG tablet  Commonly known as:  DITROPAN  Take 5 mg by mouth as needed.     tobramycin 0.3 % ophthalmic solution  Commonly known as:  TOBREX  Place 1 drop into the right eye every 6 (six) hours.     vitamin B-12 1000 MCG tablet  Commonly known as:  CYANOCOBALAMIN  Take 1,000 mcg by mouth daily.     vitamin C 500 MG tablet  Commonly known as:  ASCORBIC ACID  Take 500 mg by mouth daily.     Vitamin D 1000 UNITS capsule  Take 1,000 Units by mouth daily.     warfarin 2.5 MG tablet  Commonly known as:  COUMADIN  TAKE AS DIRECTED        Allergies:  Allergies  Allergen Reactions  . Keflex [Cephalexin]  Other (See Comments)  . Codeine   . Ramipril Cough    Past Medical History  Diagnosis Date  . Edema   . Hearing loss   . SOB (shortness of breath)     WITH WALKING  . Difficulty walking   . Skin change   . Chronic atrial fibrillation   . Diabetes mellitus type II, controlled   . Hyperlipidemia   . Depression   . Ischemic heart disease     remote stenting of the RCA in 1998 with residual LAD disease of 60 to 70% with negative nuclear in 2011  . Enlarged RV (right ventricle)     per nuclear in 2011  . Pulmonary hypertension     per echo in 2011  . VHD (valvular heart disease)     per echo in 2011 with EF 50 to 55%, moderate AI, moderate MR, LAE, RAE, moderate TR and peak PA pressures up to 32mm  . Arthritis   . Diverticulitis   . H/O blood clots 1990's    in L leg (related to being in a cast for surgery)  . Sleep apnea   . Unspecified deficiency anemia   . Thrombocytopenia   . Anal fissure   . Anemia of chronic kidney failure   . Neck mass 10/19/2013  . Anemia in chronic kidney disease(285.21) 11/13/2013  . MDS (myelodysplastic syndrome) 12/11/2013    Past Surgical History  Procedure Laterality Date  . Thyroidectomy    . Left wrist surgery    . Achilles tendon repair      Left  . Shrapnel      [place]   . Tonsillectomy and adenoidectomy    . Inguinal hernia repair    . Cataract extraction    . Vasectomy    . Angioplasty    . Colonoscopy  2009    polyps in the past    Family History  Problem Relation Age of Onset  . Heart failure Mother 16  . Diabetes Mother 64  . Heart attack Father 75  . Stroke Father 23  . Aortic aneurysm Father 20    ABDOMINAL  . Cancer Son     ?  Marland Kitchen Hepatitis Son   . Diabetes Brother     Social History:  reports that he quit smoking about 62 years ago. His smoking use included Cigarettes. He has a 2 pack-year smoking history. He has never used smokeless tobacco. He reports that he drinks alcohol. He reports that he does not use  illicit drugs.  Review of Systems:  Hypothyroidism:  he has had long-standing hypothyroidism and has been recently stable with 150 mcg of levothyroxine which he takes in the mornings daily  Lab Results  Component Value Date   TSH 2.50 09/16/2013    Lipids: These have been treated with Crestor 5 mg , also takes fish oil  Lab Results  Component Value Date   CHOL 89 09/16/2013   HDL 49.80 09/16/2013   LDLCALC 36 09/16/2013   TRIG 16.0 09/16/2013   CHOLHDL 2 09/16/2013     He has had significant osteoarthritis of his hip limiting his activity    Anemia: He has had Aranesp last year, recent hemoglobin improved    Examination:   BP 130/84  Pulse 61  Temp(Src) 98.5 F (36.9 C)  Resp 14  Ht 5\' 4"  (1.626 m)  Wt 150 lb 14.4 oz (68.448 kg)  BMI 25.89 kg/m2  SpO2 95%  Body mass index is 25.89 kg/(m^2).  146/70  standing   1+ ankle edema present  ASSESSMENT/ PLAN::   1. Diabetes type 2   Blood glucose control appears fairly good as per home records .  No hypoglycemia with low-dose glipizide. He will continue the same dose and watch diet. Currently unable to do much exercise. To have A1c checked with his next lab draw   2. Anemia of chronic disease, now followed by hematologist. He is periodically getting Aranesp. Also on iron and B12  3. Chronic kidney disease, likely to be from nephrosclerosis.creatinine relatively higher at 1.9    4. Long-standing venous edema of legs. This is well-controlled and he is also using elastic stockings. Since creatinine is slightly higher will reduce his Lasix to 40 mg alternating with 80  Sims Laday 01/12/2014, 3:07 PM   Addendum: Labs as follows:  Appointment on 01/08/2014  Component Date Value Range Status  . WBC 01/08/2014 5.8  4.0 - 10.3 10e3/uL Final  . NEUT# 01/08/2014 3.5  1.5 - 6.5 10e3/uL Final  . HGB 01/08/2014 10.6* 13.0 - 17.1 g/dL Final  . HCT 01/08/2014 32.1* 38.4 - 49.9 % Final  . Platelets 01/08/2014 115* 140 - 400 10e3/uL  Final  . MCV 01/08/2014 100.0* 79.3 - 98.0 fL Final  . MCH 01/08/2014 33.1  27.2 - 33.4 pg Final  . MCHC 01/08/2014 33.1  32.0 - 36.0 g/dL Final  . RBC 01/08/2014 3.21* 4.20 - 5.82 10e6/uL Final  . RDW 01/08/2014 15.2* 11.0 - 14.6 % Final  . lymph# 01/08/2014 1.0  0.9 - 3.3 10e3/uL Final  . MONO# 01/08/2014 0.7  0.1 - 0.9 10e3/uL Final  . Eosinophils Absolute 01/08/2014 0.5  0.0 - 0.5 10e3/uL Final  . Basophils Absolute 01/08/2014 0.1  0.0 - 0.1 10e3/uL Final  . NEUT% 01/08/2014 60.7  39.0 - 75.0 % Final  . LYMPH% 01/08/2014 17.8  14.0 - 49.0 % Final  . MONO% 01/08/2014 12.4  0.0 - 14.0 % Final  . EOS% 01/08/2014 8.2* 0.0 - 7.0 % Final  . BASO% 01/08/2014 0.9  0.0 - 2.0 % Final  . Sodium 01/08/2014 139  136 - 145 mEq/L Final  . Potassium 01/08/2014 4.7  3.5 - 5.1 mEq/L Final  . Chloride 01/08/2014 105  98 - 109 mEq/L Final  .  CO2 01/08/2014 23  22 - 29 mEq/L Final  . Glucose 01/08/2014 115  70 - 140 mg/dl Final  . BUN 01/08/2014 79.8* 7.0 - 26.0 mg/dL Final  . Creatinine 01/08/2014 1.9* 0.7 - 1.3 mg/dL Final  . Total Bilirubin 01/08/2014 0.56  0.20 - 1.20 mg/dL Final  . Alkaline Phosphatase 01/08/2014 116  40 - 150 U/L Final  . AST 01/08/2014 18  5 - 34 U/L Final  . ALT 01/08/2014 20  0 - 55 U/L Final  . Total Protein 01/08/2014 7.3  6.4 - 8.3 g/dL Final  . Albumin 01/08/2014 3.6  3.5 - 5.0 g/dL Final  . Calcium 01/08/2014 9.1  8.4 - 10.4 mg/dL Final  . Anion Gap 01/08/2014 11  3 - 11 mEq/L Final  Anti-coag visit on 01/05/2014  Component Date Value Range Status  . INR 01/05/2014 1.3   Final

## 2014-01-11 NOTE — Patient Instructions (Signed)
Lasix 80 alternating with 40

## 2014-01-12 ENCOUNTER — Other Ambulatory Visit: Payer: Self-pay | Admitting: Endocrinology

## 2014-01-12 ENCOUNTER — Other Ambulatory Visit: Payer: Self-pay | Admitting: *Deleted

## 2014-01-12 MED ORDER — LEVOTHYROXINE SODIUM 150 MCG PO TABS: ORAL_TABLET | ORAL | Status: DC

## 2014-01-15 ENCOUNTER — Ambulatory Visit (HOSPITAL_BASED_OUTPATIENT_CLINIC_OR_DEPARTMENT_OTHER): Payer: Medicare Other | Admitting: Hematology and Oncology

## 2014-01-15 ENCOUNTER — Telehealth: Payer: Self-pay | Admitting: *Deleted

## 2014-01-15 ENCOUNTER — Other Ambulatory Visit (HOSPITAL_BASED_OUTPATIENT_CLINIC_OR_DEPARTMENT_OTHER): Payer: Medicare Other

## 2014-01-15 VITALS — BP 132/45 | HR 62 | Temp 97.4°F | Resp 18 | Ht 64.0 in | Wt 149.6 lb

## 2014-01-15 DIAGNOSIS — D469 Myelodysplastic syndrome, unspecified: Secondary | ICD-10-CM

## 2014-01-15 DIAGNOSIS — N189 Chronic kidney disease, unspecified: Secondary | ICD-10-CM

## 2014-01-15 DIAGNOSIS — D696 Thrombocytopenia, unspecified: Secondary | ICD-10-CM

## 2014-01-15 DIAGNOSIS — N039 Chronic nephritic syndrome with unspecified morphologic changes: Principal | ICD-10-CM

## 2014-01-15 DIAGNOSIS — D631 Anemia in chronic kidney disease: Secondary | ICD-10-CM

## 2014-01-15 LAB — CBC WITH DIFFERENTIAL/PLATELET
BASO%: 0.7 % (ref 0.0–2.0)
Basophils Absolute: 0 10*3/uL (ref 0.0–0.1)
EOS%: 8.2 % — AB (ref 0.0–7.0)
Eosinophils Absolute: 0.5 10*3/uL (ref 0.0–0.5)
HCT: 30.8 % — ABNORMAL LOW (ref 38.4–49.9)
HGB: 9.9 g/dL — ABNORMAL LOW (ref 13.0–17.1)
LYMPH%: 14 % (ref 14.0–49.0)
MCH: 32.5 pg (ref 27.2–33.4)
MCHC: 32.3 g/dL (ref 32.0–36.0)
MCV: 100.7 fL — AB (ref 79.3–98.0)
MONO#: 0.7 10*3/uL (ref 0.1–0.9)
MONO%: 11.9 % (ref 0.0–14.0)
NEUT#: 4.1 10*3/uL (ref 1.5–6.5)
NEUT%: 65.2 % (ref 39.0–75.0)
PLATELETS: 98 10*3/uL — AB (ref 140–400)
RBC: 3.06 10*6/uL — AB (ref 4.20–5.82)
RDW: 15.6 % — ABNORMAL HIGH (ref 11.0–14.6)
WBC: 6.2 10*3/uL (ref 4.0–10.3)
lymph#: 0.9 10*3/uL (ref 0.9–3.3)

## 2014-01-15 LAB — COMPREHENSIVE METABOLIC PANEL (CC13)
ALT: 18 U/L (ref 0–55)
ANION GAP: 10 meq/L (ref 3–11)
AST: 20 U/L (ref 5–34)
Albumin: 3.7 g/dL (ref 3.5–5.0)
Alkaline Phosphatase: 123 U/L (ref 40–150)
BUN: 73.4 mg/dL — AB (ref 7.0–26.0)
CALCIUM: 8.9 mg/dL (ref 8.4–10.4)
CHLORIDE: 107 meq/L (ref 98–109)
CO2: 24 meq/L (ref 22–29)
CREATININE: 2 mg/dL — AB (ref 0.7–1.3)
GLUCOSE: 143 mg/dL — AB (ref 70–140)
Potassium: 4.7 mEq/L (ref 3.5–5.1)
Sodium: 141 mEq/L (ref 136–145)
Total Bilirubin: 0.6 mg/dL (ref 0.20–1.20)
Total Protein: 7.2 g/dL (ref 6.4–8.3)

## 2014-01-15 MED ORDER — DARBEPOETIN ALFA-POLYSORBATE 300 MCG/0.6ML IJ SOLN
300.0000 ug | Freq: Once | INTRAMUSCULAR | Status: AC
  Administered 2014-01-15: 300 ug via SUBCUTANEOUS
  Filled 2014-01-15: qty 0.6

## 2014-01-15 NOTE — Progress Notes (Signed)
[facility] OFFICE PROGRESS NOTE  Patient Care Team: Biagio Borg, MD as PCP - General (Internal Medicine) Collene Gobble, MD as Referring Physician (Pulmonary Disease) Minus Breeding, MD as Attending Physician (Cardiology) Elayne Snare, MD as Attending Physician (Endocrinology)  DIAGNOSIS: Myelodysplastic syndrome on erythropoietin stimulating agents  SUMMARY OF ONCOLOGIC HISTORY: This is a patient who has been followed here for many years. He had history of chronic anemia for some time and had a bone marrow aspirate and biopsy done in 2007 that was unremarkable. Due to chronic anemia and chronic kidney disease he was referred to the [facility] to receive erythropoietin stimulating agents. Due to anemia, he had received one dose of intravenous iron infusion in January 2014 and darbepoetin injection since June of 2014. On 10/30/2013, he had bone marrow aspirate and biopsy showed dyspoietic changes suggested for early myelodysplastic syndrome. We resume erythropoietin stimulating agents every other week to keep hemoglobin above 10 g  INTERVAL HISTORY: Douglas George 80 y.o. male returns for further followup. He saw his dermatologist requiring treatment for basal cell carcinoma. He has intermittent bleeding from treatment on his skin. The patient denies any recent signs or symptoms of bleeding such as spontaneous epistaxis, hematuria or hematochezia. His energy level is fair. Denies any chest pain, shortness of breath, dizziness or palpitations  I have reviewed the past medical history, past surgical history, social history and family history with the patient and they are unchanged from previous note.  ALLERGIES:  is allergic to keflex; codeine; and ramipril.  MEDICATIONS:  Current Outpatient Prescriptions  Medication Sig Dispense Refill  . ACCU-CHEK AVIVA PLUS test strip TEST ONCE EACH DAY AS DIRECTED  100 each  0  . acetaminophen (TYLENOL) 500 MG tablet Take 1,000 mg by  mouth 2 (two) times daily.      . Calcium Carb-Cholecalciferol (CALCIUM 1000 + D) 1000-800 MG-UNIT TABS Take 1 tablet by mouth daily.       . Cholecalciferol (VITAMIN D) 1000 UNITS capsule Take 1,000 Units by mouth daily.       . clotrimazole-betamethasone (LOTRISONE) cream       . CRESTOR 10 MG tablet TAKE ONE TABLET BY MOUTH DAILY  30 tablet  5  . doxazosin (CARDURA) 8 MG tablet Take 8 mg by mouth daily. Takes 1/2 tablet at night.      . fish oil-omega-3 fatty acids 1000 MG capsule Take 1 g by mouth daily.      . furosemide (LASIX) 80 MG tablet TAKE 1 tablet daily      . glipiZIDE (GLUCOTROL XL) 2.5 MG 24 hr tablet Take 1 tablet (2.5 mg total) by mouth daily.  30 tablet  5  . Glucosamine-Chondroitin (OSTEO BI-FLEX REGULAR STRENGTH PO) Take by mouth.      . levothyroxine (SYNTHROID, LEVOTHROID) 150 MCG tablet TAKE ONE TABLET BY MOUTH ONCE EVERY MORNING ON AN EMPTY STOMACH.  30 tablet  4  . losartan (COZAAR) 50 MG tablet TAKE 1 TABLET BY MOUTH EVERY DAY  30 tablet  3  . Magnesium 300 MG CAPS Take 300 mg by mouth.      . oxybutynin (DITROPAN) 5 MG tablet Take 5 mg by mouth as needed.        . tobramycin (TOBREX) 0.3 % ophthalmic solution Place 1 drop into the right eye every 6 (six) hours.  5 mL  0  . vitamin B-12 (CYANOCOBALAMIN) 1000 MCG tablet Take 1,000 mcg by mouth daily.      Marland Kitchen  vitamin C (ASCORBIC ACID) 500 MG tablet Take 500 mg by mouth daily.      Marland Kitchen warfarin (COUMADIN) 2.5 MG tablet TAKE AS DIRECTED  30 tablet  0   No current facility-administered medications for this visit.    REVIEW OF SYSTEMS:   Constitutional: Denies fevers, chills or abnormal weight loss Eyes: Denies blurriness of vision Ears, nose, mouth, throat, and face: Denies mucositis or sore throat Respiratory: Denies cough, dyspnea or wheezes Cardiovascular: Denies palpitation, chest discomfort or lower extremity swelling Gastrointestinal:  Denies nausea, heartburn or change in bowel habits Lymphatics: Denies new  lymphadenopathy or easy bruising Neurological:Denies numbness, tingling or new weaknesses Behavioral/Psych: Mood is stable, no new changes  All other systems were reviewed with the patient and are negative.  PHYSICAL EXAMINATION: ECOG PERFORMANCE STATUS: 1 - Symptomatic but completely ambulatory  Filed Vitals:   01/15/14 1335  BP: 132/45  Pulse: 62  Temp: 97.4 F (36.3 C)  Resp: 18   Filed Weights   01/15/14 1335  Weight: 149 lb 9.6 oz (67.858 kg)    GENERAL:alert, no distress and comfortable SKIN: Significant skin lesions throughout with some mild bleeding on treated sites EYES: normal, Conjunctiva are pink and non-injected, sclera clear OROPHARYNX:no exudate, no erythema and lips, buccal mucosa, and tongue normal  NECK: supple, thyroid normal size, non-tender, without nodularity LYMPH:  no palpable lymphadenopathy in the cervical, axillary or inguinal LUNGS: clear to auscultation and percussion with normal breathing effort HEART: regular rate & rhythm and no murmurs and no lower extremity edema ABDOMEN:abdomen soft, non-tender and normal bowel sounds Musculoskeletal:no cyanosis of digits and no clubbing  NEURO: alert & oriented x 3 with fluent speech, no focal motor/sensory deficits  LABORATORY DATA:  I have reviewed the data as listed    Component Value Date/Time   NA 141 01/15/2014 1317   NA 135 09/16/2013 1438   K 4.7 01/15/2014 1317   K 4.5 09/16/2013 1438   CL 103 09/16/2013 1438   CL 104 06/19/2013 1517   CO2 24 01/15/2014 1317   CO2 25 09/16/2013 1438   GLUCOSE 143* 01/15/2014 1317   GLUCOSE 111* 09/16/2013 1438   GLUCOSE 90 06/19/2013 1517   BUN 73.4* 01/15/2014 1317   BUN 63* 09/16/2013 1438   CREATININE 2.0* 01/15/2014 1317   CREATININE 1.8* 09/16/2013 1438   CALCIUM 8.9 01/15/2014 1317   CALCIUM 8.9 09/16/2013 1438   PROT 7.2 01/15/2014 1317   PROT 6.8 06/03/2013 1139   ALBUMIN 3.7 01/15/2014 1317   ALBUMIN 3.7 06/03/2013 1139   AST 20 01/15/2014 1317   AST 26 06/03/2013  1139   ALT 18 01/15/2014 1317   ALT 16 06/03/2013 1139   ALKPHOS 123 01/15/2014 1317   ALKPHOS 102 06/03/2013 1139   BILITOT 0.60 01/15/2014 1317   BILITOT 1.5* 06/03/2013 1139    No results found for this basename: SPEP,  UPEP,   kappa and lambda light chains    Lab Results  Component Value Date   WBC 6.2 01/15/2014   NEUTROABS 4.1 01/15/2014   HGB 9.9* 01/15/2014   HCT 30.8* 01/15/2014   MCV 100.7* 01/15/2014   PLT 98* 01/15/2014      Chemistry      Component Value Date/Time   NA 141 01/15/2014 1317   NA 135 09/16/2013 1438   K 4.7 01/15/2014 1317   K 4.5 09/16/2013 1438   CL 103 09/16/2013 1438   CL 104 06/19/2013 1517   CO2 24 01/15/2014 1317  CO2 25 09/16/2013 1438   BUN 73.4* 01/15/2014 1317   BUN 63* 09/16/2013 1438   CREATININE 2.0* 01/15/2014 1317   CREATININE 1.8* 09/16/2013 1438      Component Value Date/Time   CALCIUM 8.9 01/15/2014 1317   CALCIUM 8.9 09/16/2013 1438   ALKPHOS 123 01/15/2014 1317   ALKPHOS 102 06/03/2013 1139   AST 20 01/15/2014 1317   AST 26 06/03/2013 1139   ALT 18 01/15/2014 1317   ALT 16 06/03/2013 1139   BILITOT 0.60 01/15/2014 1317   BILITOT 1.5* 06/03/2013 1139     ASSESSMENT & PLAN:  #1 myelodysplastic syndrome His bone marrow aspirate and biopsy was abnormal. At present time, his calculated IPS test score is low. Survival is still measured in years. I recommend we continue erythropoietin stimulating agents to keep hemoglobin above 10 g #2 anemia This is likely anemia of chronic disease from his chronic renal failure and MDS. The patient denies recent history of bleeding such as epistaxis, hematuria or hematochezia. He is asymptomatic from the anemia. We will observe for now.  He does not require transfusion now.  I recommend we continue Aranesp injection but to increase the dose and frequency to every 6 weeks.  Goal of therapy is to keep hemoglobin above 10 g #3 chronic thrombocytopenia The cause is due to MDS. He is not symptomatic. We will observe only. #4  chronic kidney disease This is stable. I noted very high BUN. I recommend he reduce his Lasix dose  Orders Placed This Encounter  Procedures  . CBC with Differential    Standing Status: Future     Number of Occurrences:      Standing Expiration Date: 10/07/2014  . Basic metabolic panel    Standing Status: Future     Number of Occurrences:      Standing Expiration Date: 01/15/2015   All questions were answered. The patient knows to call the clinic with any problems, questions or concerns. No barriers to learning was detected. I spent 15 minutes counseling the patient face to face. The total time spent in the appointment was 20 minutes and more than 50% was on counseling and review of test results     [facility], [place], MD 01/15/2014 3:18 PM

## 2014-01-15 NOTE — Telephone Encounter (Signed)
appts made and printed...td 

## 2014-01-19 ENCOUNTER — Other Ambulatory Visit: Payer: Self-pay | Admitting: Hematology and Oncology

## 2014-01-22 ENCOUNTER — Other Ambulatory Visit: Payer: Medicare Other

## 2014-01-22 ENCOUNTER — Ambulatory Visit: Payer: Medicare Other

## 2014-01-24 ENCOUNTER — Other Ambulatory Visit: Payer: Self-pay | Admitting: Internal Medicine

## 2014-01-24 ENCOUNTER — Other Ambulatory Visit: Payer: Self-pay | Admitting: Endocrinology

## 2014-01-25 ENCOUNTER — Other Ambulatory Visit: Payer: Self-pay | Admitting: General Practice

## 2014-01-25 MED ORDER — WARFARIN SODIUM 2.5 MG PO TABS: ORAL_TABLET | ORAL | Status: DC

## 2014-01-25 NOTE — Telephone Encounter (Signed)
JJ pt

## 2014-01-25 NOTE — Telephone Encounter (Signed)
Last filled 12/15/13--by you, but did not see where pt has seen you--please advise

## 2014-02-04 ENCOUNTER — Other Ambulatory Visit: Payer: Self-pay | Admitting: Hematology and Oncology

## 2014-02-04 ENCOUNTER — Other Ambulatory Visit: Payer: Self-pay | Admitting: *Deleted

## 2014-02-05 ENCOUNTER — Other Ambulatory Visit: Payer: Medicare Other

## 2014-02-05 ENCOUNTER — Telehealth: Payer: Self-pay | Admitting: Hematology and Oncology

## 2014-02-05 ENCOUNTER — Ambulatory Visit: Payer: Medicare Other

## 2014-02-05 NOTE — Telephone Encounter (Signed)
s.w. pt wife and advised on Feb appt...ok adn aware and wanted to pick up sched at that visit

## 2014-02-12 ENCOUNTER — Ambulatory Visit (INDEPENDENT_AMBULATORY_CARE_PROVIDER_SITE_OTHER): Payer: Medicare Other | Admitting: General Practice

## 2014-02-12 DIAGNOSIS — I4891 Unspecified atrial fibrillation: Secondary | ICD-10-CM

## 2014-02-12 DIAGNOSIS — Z5181 Encounter for therapeutic drug level monitoring: Secondary | ICD-10-CM

## 2014-02-12 LAB — POCT INR: INR: 1.9

## 2014-02-12 NOTE — Progress Notes (Signed)
Pre-visit discussion using our clinic review tool. No additional management support is needed unless otherwise documented below in the visit note.  

## 2014-02-18 ENCOUNTER — Telehealth: Payer: Self-pay | Admitting: *Deleted

## 2014-02-18 ENCOUNTER — Other Ambulatory Visit: Payer: Self-pay | Admitting: *Deleted

## 2014-02-18 MED ORDER — METOLAZONE 2.5 MG PO TABS: ORAL_TABLET | ORAL | Status: DC

## 2014-02-18 NOTE — Telephone Encounter (Signed)
rx sent, patient is aware 

## 2014-02-18 NOTE — Telephone Encounter (Signed)
Patients wife called, she says that Douglas George legs are very swollen, she has started giving him 1 1/2 pills but the swelling is not going down. She is unable to bring him in because of all the ice at their house, please advise.

## 2014-02-18 NOTE — Telephone Encounter (Signed)
From tomorrow start Zaroxolyn 2.5 mg every other day, take 30 minutes before the furosemide 80 mg daily.  May take extra half tablet now  May stop the new medication when swelling goes down. Also keep an eye on blood pressure

## 2014-02-20 ENCOUNTER — Other Ambulatory Visit: Payer: Self-pay | Admitting: Endocrinology

## 2014-02-22 ENCOUNTER — Other Ambulatory Visit: Payer: Self-pay | Admitting: *Deleted

## 2014-02-22 ENCOUNTER — Encounter: Payer: Self-pay | Admitting: Nurse Practitioner

## 2014-02-22 ENCOUNTER — Ambulatory Visit (INDEPENDENT_AMBULATORY_CARE_PROVIDER_SITE_OTHER): Payer: Medicare Other | Admitting: Nurse Practitioner

## 2014-02-22 ENCOUNTER — Encounter (INDEPENDENT_AMBULATORY_CARE_PROVIDER_SITE_OTHER): Payer: Medicare Other

## 2014-02-22 ENCOUNTER — Telehealth: Payer: Self-pay | Admitting: Nurse Practitioner

## 2014-02-22 VITALS — BP 148/70 | HR 48 | Ht 66.0 in | Wt 174.8 lb

## 2014-02-22 DIAGNOSIS — I498 Other specified cardiac arrhythmias: Secondary | ICD-10-CM

## 2014-02-22 DIAGNOSIS — I4891 Unspecified atrial fibrillation: Secondary | ICD-10-CM

## 2014-02-22 DIAGNOSIS — R609 Edema, unspecified: Secondary | ICD-10-CM

## 2014-02-22 MED ORDER — METOLAZONE 2.5 MG PO TABS: ORAL_TABLET | ORAL | Status: DC

## 2014-02-22 MED ORDER — LEVOFLOXACIN 250 MG PO TABS: 250.0000 mg | ORAL_TABLET | Freq: Every day | ORAL | Status: DC

## 2014-02-22 NOTE — Telephone Encounter (Signed)
Per wife call - states ankle edema X 2 weeks.  No improvement with extra Furosemide or Zaroxolyn.  Requesting to be seen today if possible.  Appointment given for 3 pm today with Truitt Merle, NP

## 2014-02-22 NOTE — Telephone Encounter (Signed)
New Message  Pt called--states that his legs have been swollen for 2 weeks..Requests a call back for same day appt//SR ( pt is requesting an appt with LORI)

## 2014-02-22 NOTE — Patient Instructions (Signed)
Elevate your legs as much as possible  Wear the support stockings  Lasix 80 mg a day  Take the Zaroxolyn 2.5 mg for Tuesday, Wednesday and Thursday and then STOP  Levaquin 250 mg daily for a week  We will need to put on a 24 hour Holter monitor - this is to tell me what your heart rate is doing  We will check labs today  I need to see you Friday

## 2014-02-22 NOTE — Progress Notes (Addendum)
[name] Number Date of Birth: Jan 18, 1927 Medical Record [id number]  History of Present Illness: Cy is seen back today for a work in visit. Seen for Dr. Percival Spanish - former patient of Dr. Susa Simmonds. He has multiple medical issues which include chronic atrial fib, on coumadin, past alcohol abuse, anemia of chronic disease,myelodysplastic syndrome followed by oncology, DM, HLD, depression, ischemic heart disease with prior stenting of the RCA and a residual 60 to 70% LAD stenosis back in 1998 as well as pulmonary HTN. Last stress test was in 2011 showing RV enlargement with no ischemia and a normal EF. Other problems include hypothyroidism, OA and valvular heart disease. He had his last echo back in 2011 showing an EF of 50 to 55%, moderate AI, moderate MR, LAE, RAE, moderate TR and peak PA pressures up to 79mm Hg. He has been managed conservatively and was never cathed - managed conservatively. Remains on CPAP for his OSA.   Last seen here in August of 2014 by Dr. Percival Spanish. Seemed to be stable from our standpoint.   I saw him back in December - seemed to be doing ok - had had a bout of pneumonia and a bout of cellulitis but was improving. Seemed to be failing in a generalized fashion. He was on both ACE and ARB - not clear as to why - but we stopped his ACE.  Comes back today. Here with [name]. She notes that he was doing ok after I last saw him. Kidney function remain off but this is chronic. Then had his Lasix cut back - did ok for about a week but then had more swelling. Has been given Zaroxolyn as well - only given 2 doses. Weight is up considerably. Had trouble getting him in the shower today. He does not feel like he is short of breath. Some cough noted - with some yellow sputum. No chest pain. Not dizzy or lightheaded. No fever or chills noted. [name] wanted to bring him in sooner but due to the weather/snow - Cy did not want to come. Hard to get shoes on. Trying to use support stockings and  elevate his legs. He is not due to go back to the [facility] until later this week.   Current Outpatient Prescriptions  Medication Sig Dispense Refill  . ACCU-CHEK AVIVA PLUS test strip TEST ONCE EACH DAY AS DIRECTED  100 each  0  . acetaminophen (TYLENOL) 500 MG tablet Take 1,000 mg by mouth 2 (two) times daily.      . Calcium Carb-Cholecalciferol (CALCIUM 1000 + D) 1000-800 MG-UNIT TABS Take 1 tablet by mouth daily.       . Cholecalciferol (VITAMIN D) 1000 UNITS capsule Take 1,000 Units by mouth daily.       . clotrimazole-betamethasone (LOTRISONE) cream       . CRESTOR 10 MG tablet TAKE ONE TABLET BY MOUTH DAILY  30 tablet  5  . doxazosin (CARDURA) 8 MG tablet TAKE 1/2 TABLET BY MOUTH TWICE DAILY  30 tablet  5  . fish oil-omega-3 fatty acids 1000 MG capsule Take 1 g by mouth daily.      . furosemide (LASIX) 80 MG tablet TAKE 1 tablet daily      . glipiZIDE (GLUCOTROL XL) 2.5 MG 24 hr tablet TAKE 1 TABLET BY MOUTH EVERY DAY  30 tablet  5  . Glucosamine-Chondroitin (OSTEO BI-FLEX REGULAR STRENGTH PO) Take by mouth.      . levothyroxine (SYNTHROID, LEVOTHROID) 150 MCG tablet TAKE ONE TABLET  BY MOUTH ONCE EVERY MORNING ON AN EMPTY STOMACH.  30 tablet  4  . losartan (COZAAR) 50 MG tablet TAKE 1 TABLET BY MOUTH EVERY DAY  30 tablet  3  . Magnesium 300 MG CAPS Take 300 mg by mouth.      . metolazone (ZAROXOLYN) 2.5 MG tablet Take one tablet every other day 30 minutes before taking furosemide  15 tablet  0  . oxybutynin (DITROPAN) 5 MG tablet Take 5 mg by mouth as needed.        . tobramycin (TOBREX) 0.3 % ophthalmic solution Place 1 drop into the right eye every 6 (six) hours.  5 mL  0  . vitamin B-12 (CYANOCOBALAMIN) 1000 MCG tablet Take 1,000 mcg by mouth daily.      . vitamin C (ASCORBIC ACID) 500 MG tablet Take 500 mg by mouth daily.      Marland Kitchen warfarin (COUMADIN) 2.5 MG tablet Take as directed by anticoagulation clinic  30 tablet  3   No current facility-administered medications for this visit.     Allergies  Allergen Reactions  . Keflex [Cephalexin] Other (See Comments)  . Codeine   . Ramipril Cough    Past Medical History  Diagnosis Date  . Edema   . Hearing loss   . SOB (shortness of breath)     WITH WALKING  . Difficulty walking   . Skin change   . Chronic atrial fibrillation   . Diabetes mellitus type II, controlled   . Hyperlipidemia   . Depression   . Ischemic heart disease     remote stenting of the RCA in 1998 with residual LAD disease of 60 to 70% with negative nuclear in 2011  . Enlarged RV (right ventricle)     per nuclear in 2011  . Pulmonary hypertension     per echo in 2011  . VHD (valvular heart disease)     per echo in 2011 with EF 50 to 55%, moderate AI, moderate MR, LAE, RAE, moderate TR and peak PA pressures up to 80mm  . Arthritis   . Diverticulitis   . H/O blood clots 1990's    in L leg (related to being in a cast for surgery)  . Sleep apnea   . Unspecified deficiency anemia   . Thrombocytopenia   . Anal fissure   . Anemia of chronic kidney failure   . Neck mass 10/19/2013  . Anemia in chronic kidney disease(285.21) 11/13/2013  . MDS (myelodysplastic syndrome) 12/11/2013    Past Surgical History  Procedure Laterality Date  . Thyroidectomy    . Left wrist surgery    . Achilles tendon repair      Left  . Shrapnel      [place]   . Tonsillectomy and adenoidectomy    . Inguinal hernia repair    . Cataract extraction    . Vasectomy    . Angioplasty    . Colonoscopy  2009    polyps in the past    History  Smoking status  . Former Smoker -- 2.00 packs/day for 1 years  . Types: Cigarettes  . Quit date: 01/01/1952  Smokeless tobacco  . Never Used    History  Alcohol Use  . Yes    Comment: 1 glass wine daily    Family History  Problem Relation Age of Onset  . Heart failure Mother 83  . Diabetes Mother 68  . Heart attack Father 55  . Stroke Father 15  . Aortic  aneurysm Father 87    ABDOMINAL  . Cancer Son     ?  Marland Kitchen  Hepatitis Son   . Diabetes Brother     Review of Systems: The review of systems is per the HPI.  All other systems were reviewed and are negative.  Physical Exam: BP 148/70  Pulse 48  Ht 5\' 6"  (1.676 m)  Wt 174 lb 12.8 oz (79.289 kg)  BMI 28.23 kg/m2  SpO2 98% Patient is very pleasant and in no acute distress. He looks chronically ill. He is coughing. Weight is up considerably. Skin is warm and dry. Color is normal.  HEENT is unremarkable. Normocephalic/atraumatic. PERRL. Sclera are nonicteric. Neck is supple. No masses. No JVD. Lungs are clear. Cardiac exam shows an irregular rate and rhythm. His rate is slow - 44 by me. Abdomen is soft. Extremities are very full with over 2+ edema extending into the thighs. Gait and ROM are intact. No gross neurologic deficits noted.  Wt Readings from Last 3 Encounters:  02/22/14 174 lb 12.8 oz (79.289 kg)  01/15/14 149 lb 9.6 oz (67.858 kg)  01/11/14 150 lb 14.4 oz (68.448 kg)     LABORATORY DATA: PENDING  Lab Results  Component Value Date   WBC 6.2 01/15/2014   HGB 9.9* 01/15/2014   HCT 30.8* 01/15/2014   PLT 98* 01/15/2014   GLUCOSE 143* 01/15/2014   CHOL 89 09/16/2013   TRIG 16.0 09/16/2013   HDL 49.80 09/16/2013   LDLCALC 36 09/16/2013   ALT 18 01/15/2014   AST 20 01/15/2014   NA 141 01/15/2014   K 4.7 01/15/2014   CL 103 09/16/2013   CREATININE 2.0* 01/15/2014   BUN 73.4* 01/15/2014   CO2 24 01/15/2014   TSH 2.50 09/16/2013   INR 1.9 02/12/2014   HGBA1C 6.6* 09/16/2013   Echo Study Conclusions from January 2014  - Left ventricle: The cavity size was normal. Wall thickness was normal. Systolic function was normal. The estimated ejection fraction was in the range of 55% to 60%. Wall motion was normal; there were no regional wall motion abnormalities. Doppler parameters are consistent with restrictive physiology, indicative of decreased left ventricular diastolic compliance and/or increased left atrial pressure. - Aortic valve: Trileaflet;  moderately calcified leaflets. There was no stenosis. Mild regurgitation. - Ascending aorta: The visualized portion of the ascending aorta was dilated to 4.1 cm. - Mitral valve: Mildly calcified annulus. There was no evidence for stenosis. Mild regurgitation. - Left atrium: The atrium was severely dilated. - Right ventricle: D-shaped interventricular septum suggestive RV pressure and volume overload. The cavity size was moderately dilated. Systolic function was mildly reduced. - Right atrium: The atrium was severely dilated. - Tricuspid valve: Peak RV-RA gradient: 96mm Hg (S). - Pulmonary arteries: PA peak pressure: 13mm Hg (S). - Systemic veins: IVC dilated to 3.8 cm with no respirophasic variation, suggesting RA pressure 20 mmHg.    Assessment / Plan: 1. Atrial fib - rate slower - placing Holter today to further assess.  2. Swelling - probably multifactorial - will recheck his labs - get him transfused if needed. Will use Lasix 80 mg a day and only use the Zaroxolyn for 3 days and then stop it.  3. Anemia - with known blood disorder (MDS) - recheck his labs today  4. Cough - productive of yellow sputum - had pneumonia 2 months ago - I have given him a week of Levaquin. Check INR today  5. CAD - managed medically. No symptoms reported.  If he fails to improve, he will need to be hospitalized. He is not short of breath at this time. I would hope that if we can improve his counts and diurese him that he will improve.  I will see him back later this week.  Patient is agreeable to this plan and will call if any problems develop in the interim.   Burtis Junes, RN, [place] [address] [place], [place]  [phone]  Addendum - 02/26/2014 We have received numerous phone calls since his last visit here - very hard to discern if he is improving or not. Wife is not able to weigh him at home. Weight was down 5 pounds  while at the [facility] on Wednesday. Has finished his Zaroxolyn. I was going to see him back today - but she could not get him back out due to snow. He has refused to go to the hospital but we have reiterated numerous times that if he fails on outpatient therapy, then this is really our only option. I have gotten his holter back - minimum HR was 24, max was 85, with an average of 49. His longest pause was 3.16 at about 10:30 pm. Will need to review with EP.

## 2014-02-23 ENCOUNTER — Telehealth: Payer: Self-pay | Admitting: Hematology and Oncology

## 2014-02-23 ENCOUNTER — Telehealth: Payer: Self-pay | Admitting: *Deleted

## 2014-02-23 ENCOUNTER — Telehealth: Payer: Self-pay | Admitting: Nurse Practitioner

## 2014-02-23 LAB — PROTIME-INR
INR: 2.4 ratio — ABNORMAL HIGH (ref 0.8–1.0)
Prothrombin Time: 24.6 s — ABNORMAL HIGH (ref 10.2–12.4)

## 2014-02-23 LAB — BASIC METABOLIC PANEL
BUN: 76 mg/dL — ABNORMAL HIGH (ref 6–23)
CO2: 24 mEq/L (ref 19–32)
Calcium: 8.7 mg/dL (ref 8.4–10.5)
Chloride: 98 mEq/L (ref 96–112)
Creatinine, Ser: 2.1 mg/dL — ABNORMAL HIGH (ref 0.4–1.5)
GFR: 32.61 mL/min — ABNORMAL LOW (ref >60.00)
Glucose, Bld: 77 mg/dL (ref 70–99)
Potassium: 4.8 mEq/L (ref 3.5–5.1)
Sodium: 130 mEq/L — ABNORMAL LOW (ref 135–145)

## 2014-02-23 LAB — CBC
HCT: 29.8 % — ABNORMAL LOW (ref 39.0–52.0)
Hemoglobin: 9.7 g/dL — ABNORMAL LOW (ref 13.0–17.0)
MCHC: 32.6 g/dL (ref 30.0–36.0)
MCV: 100.3 fl — ABNORMAL HIGH (ref 78.0–100.0)
Platelets: 117 10*3/uL — ABNORMAL LOW (ref 150.0–400.0)
RBC: 2.97 Mil/uL — ABNORMAL LOW (ref 4.22–5.81)
RDW: 16.9 % — ABNORMAL HIGH (ref 11.5–14.6)
WBC: 6.2 10*3/uL (ref 4.5–10.5)

## 2014-02-23 NOTE — Telephone Encounter (Signed)
S/w pt's wife apologized for the wait on the results still sitting at office from last night the courier has not been here due to inclement weather,  will come at 12:00 and change labs to stat.  Cecille Rubin will take full responsibility to call pt back today as soon as Cecille Rubin receives results. Waiting to hear about results due to pt being anemic and sending pt off for blood transfusion today. Did not know that after 4 pm blood does not get picked up from our office till the next day have to order stat in [facility] for blood to be picked up that pm.

## 2014-02-23 NOTE — Telephone Encounter (Signed)
s.w. pt wife and advised on appt move to 2.25.15 per pof....pt ok adn aware

## 2014-02-23 NOTE — Telephone Encounter (Signed)
New message  Patient would like results of blood work that was done yesterday, please call and advise.

## 2014-02-23 NOTE — Telephone Encounter (Signed)
Informed wife of order sent to r/s pt to tomorrow afternoon.  She should get a call from Scheduling.  She can call us back if she hasn't heard anything by tomorrow morning.  She verbalized understanding.

## 2014-02-23 NOTE — Telephone Encounter (Signed)
Wife states she feels like pt should get his labs done sooner than this Friday 2/27 as scheduled.  She asks if he can come for lab/injection (aranesp) tomorrow afternoon instead of waiting until Friday?

## 2014-02-23 NOTE — Telephone Encounter (Signed)
Yes

## 2014-02-24 ENCOUNTER — Other Ambulatory Visit (HOSPITAL_BASED_OUTPATIENT_CLINIC_OR_DEPARTMENT_OTHER): Payer: Medicare Other

## 2014-02-24 ENCOUNTER — Ambulatory Visit (HOSPITAL_BASED_OUTPATIENT_CLINIC_OR_DEPARTMENT_OTHER): Payer: Medicare Other

## 2014-02-24 VITALS — BP 122/45 | HR 60 | Temp 97.6°F | Wt 174.1 lb

## 2014-02-24 DIAGNOSIS — D469 Myelodysplastic syndrome, unspecified: Secondary | ICD-10-CM

## 2014-02-24 DIAGNOSIS — D630 Anemia in neoplastic disease: Secondary | ICD-10-CM

## 2014-02-24 LAB — CBC WITH DIFFERENTIAL/PLATELET
BASO%: 0.5 % (ref 0.0–2.0)
Basophils Absolute: 0 10*3/uL (ref 0.0–0.1)
EOS ABS: 0.2 10*3/uL (ref 0.0–0.5)
EOS%: 3.7 % (ref 0.0–7.0)
HCT: 29.3 % — ABNORMAL LOW (ref 38.4–49.9)
HGB: 9.8 g/dL — ABNORMAL LOW (ref 13.0–17.1)
LYMPH#: 0.8 10*3/uL — AB (ref 0.9–3.3)
LYMPH%: 13.5 % — ABNORMAL LOW (ref 14.0–49.0)
MCH: 31.9 pg (ref 27.2–33.4)
MCHC: 33.4 g/dL (ref 32.0–36.0)
MCV: 95.4 fL (ref 79.3–98.0)
MONO#: 0.8 10*3/uL (ref 0.1–0.9)
MONO%: 14.2 % — ABNORMAL HIGH (ref 0.0–14.0)
NEUT%: 68.1 % (ref 39.0–75.0)
NEUTROS ABS: 3.8 10*3/uL (ref 1.5–6.5)
NRBC: 0 % (ref 0–0)
Platelets: 105 10*3/uL — ABNORMAL LOW (ref 140–400)
RBC: 3.07 10*6/uL — AB (ref 4.20–5.82)
RDW: 16 % — AB (ref 11.0–14.6)
WBC: 5.6 10*3/uL (ref 4.0–10.3)

## 2014-02-24 LAB — BASIC METABOLIC PANEL (CC13)
Anion Gap: 13 mEq/L — ABNORMAL HIGH (ref 3–11)
BUN: 77.5 mg/dL — AB (ref 7.0–26.0)
CHLORIDE: 98 meq/L (ref 98–109)
CO2: 21 mEq/L — ABNORMAL LOW (ref 22–29)
Calcium: 9 mg/dL (ref 8.4–10.4)
Creatinine: 2.2 mg/dL — ABNORMAL HIGH (ref 0.7–1.3)
GLUCOSE: 113 mg/dL (ref 70–140)
Potassium: 5 mEq/L (ref 3.5–5.1)
Sodium: 131 mEq/L — ABNORMAL LOW (ref 136–145)

## 2014-02-24 MED ORDER — DARBEPOETIN ALFA-POLYSORBATE 300 MCG/0.6ML IJ SOLN
300.0000 ug | Freq: Once | INTRAMUSCULAR | Status: AC
  Administered 2014-02-24: 300 ug via SUBCUTANEOUS
  Filled 2014-02-24: qty 0.6

## 2014-02-24 NOTE — Telephone Encounter (Signed)
Pt's wife came in today to drop of holter monitor and stated pt is down 5 lbs does pt still need to go to ER I stated no pt has been creeping up weight for 1 month and is finally down 5 lbs, stated I will discuss with Cecille Rubin but if Cecille Rubin agrees will see pt on Monday for appt and I will not call pt's wife back. Wife agreeable to plan

## 2014-02-26 ENCOUNTER — Telehealth: Payer: Self-pay | Admitting: Nurse Practitioner

## 2014-02-26 ENCOUNTER — Telehealth: Payer: Self-pay | Admitting: *Deleted

## 2014-02-26 ENCOUNTER — Ambulatory Visit: Payer: Medicare Other | Admitting: Nurse Practitioner

## 2014-02-26 ENCOUNTER — Ambulatory Visit: Payer: Medicare Other

## 2014-02-26 ENCOUNTER — Other Ambulatory Visit: Payer: Medicare Other

## 2014-02-26 NOTE — Telephone Encounter (Signed)
S/w pt's wife stated ankles are getting worse, I stated if pt weighed stated their scale is off and a digital scale does not weigh properly. T/w Cecille Rubin advised pt go to ER if not will see Cecille Rubin on Monday

## 2014-02-26 NOTE — Telephone Encounter (Signed)
New message     Legs are beginning to swell again---don't think they should wait until Monday to be seen.  pls call back

## 2014-02-26 NOTE — Telephone Encounter (Signed)
S/w pt's wife legs are looking better slowly coming down stopped metalazone continue with current medications will see pt back in office on 2/2 pt's wife agreeable to plan

## 2014-02-26 NOTE — Telephone Encounter (Signed)
Wife was calling back to talk with Cecille Rubin stated pt is taking (120mg ) daily of lasix will let Cecille Rubin know to call pt's wife back

## 2014-03-01 ENCOUNTER — Encounter: Payer: Self-pay | Admitting: Nurse Practitioner

## 2014-03-01 ENCOUNTER — Ambulatory Visit (INDEPENDENT_AMBULATORY_CARE_PROVIDER_SITE_OTHER): Payer: Medicare Other | Admitting: Internal Medicine

## 2014-03-01 ENCOUNTER — Ambulatory Visit (INDEPENDENT_AMBULATORY_CARE_PROVIDER_SITE_OTHER): Payer: Medicare Other | Admitting: Nurse Practitioner

## 2014-03-01 ENCOUNTER — Encounter: Payer: Self-pay | Admitting: Internal Medicine

## 2014-03-01 ENCOUNTER — Encounter: Payer: Self-pay | Admitting: *Deleted

## 2014-03-01 VITALS — Ht 66.0 in | Wt 168.4 lb

## 2014-03-01 VITALS — BP 120/80 | HR 97 | Ht 66.0 in | Wt 168.4 lb

## 2014-03-01 DIAGNOSIS — I4891 Unspecified atrial fibrillation: Secondary | ICD-10-CM

## 2014-03-01 DIAGNOSIS — I498 Other specified cardiac arrhythmias: Secondary | ICD-10-CM

## 2014-03-01 DIAGNOSIS — R609 Edema, unspecified: Secondary | ICD-10-CM

## 2014-03-01 DIAGNOSIS — I259 Chronic ischemic heart disease, unspecified: Secondary | ICD-10-CM

## 2014-03-01 DIAGNOSIS — I442 Atrioventricular block, complete: Secondary | ICD-10-CM

## 2014-03-01 DIAGNOSIS — I482 Chronic atrial fibrillation, unspecified: Secondary | ICD-10-CM

## 2014-03-01 DIAGNOSIS — R001 Bradycardia, unspecified: Secondary | ICD-10-CM

## 2014-03-01 NOTE — Patient Instructions (Signed)
We need to get you a visit with Dr. Lovena Le to discuss pacemaker implant  We need to recheck labs today  Check INR today  Stay on your current medicines but  Call the [facility] office at [phone] if you have any questions, problems or concerns.

## 2014-03-01 NOTE — Progress Notes (Signed)
[name] Number Date of Birth: [birth year] Medical Record #[id number]  History of Present Illness: Douglas George is seen back today for a follow up visit. Seen for Dr. Percival Spanish - former patient of Dr. Susa Simmonds. He has multiple medical issues which include chronic atrial fib, on coumadin, past alcohol abuse, anemia of chronic disease,myelodysplastic syndrome followed by oncology, DM, HLD, depression, ischemic heart disease with prior stenting of the RCA and a residual 60 to 70% LAD stenosis back in 1998 as well as pulmonary HTN. Last stress test was in 2011 showing RV enlargement with no ischemia and a normal EF. Other problems include hypothyroidism, OA and valvular heart disease. He had his last echo back in 2011 showing an EF of 50 to 55%, moderate AI, moderate MR, LAE, RAE, moderate TR and peak PA pressures up to 2mm Hg. He has been managed conservatively and was never cathed - managed conservatively. Remains on CPAP for his OSA.   Last seen here in August of 2014 by Dr. Percival Spanish. Seemed to be stable from our standpoint.   I saw him back in December - seemed to be doing ok - had had a bout of pneumonia and a bout of cellulitis but was improving. Seemed to be failing in a generalized fashion. He was on both ACE and ARB - not clear as to why - but we stopped his ACE.   Seen last week - his weight was up considerably. Was not short of breath. Some cough noted - with some yellow sputum. Weight was way up. We diuresed him. Gave empiric antibiotics. Also noted to be more bradycardic and we placed a Holter. Have had several phone calls over the past week - not able to weigh - hard to say if he was improving and he refused to go to the hospital.  Comes back today. Here with Douglas George his wife. He tells me that he is feeling better. He is still NOT short of breath. His swelling has improved - not resolved but improved. Cough has improved. Not dizzy or lightheaded. No falls. Was able to get in the shower today. No chest  pain. No bleeding problems noted.   Current Outpatient Prescriptions  Medication Sig Dispense Refill  . ACCU-CHEK AVIVA PLUS test strip TEST ONCE EACH DAY AS DIRECTED  100 each  0  . acetaminophen (TYLENOL) 500 MG tablet Take 1,000 mg by mouth 2 (two) times daily.      . Calcium Carb-Cholecalciferol (CALCIUM 1000 + D) 1000-800 MG-UNIT TABS Take 1 tablet by mouth daily.       . Cholecalciferol (VITAMIN D) 1000 UNITS capsule Take 1,000 Units by mouth daily.       . clotrimazole-betamethasone (LOTRISONE) cream       . CRESTOR 10 MG tablet TAKE ONE TABLET BY MOUTH DAILY  30 tablet  5  . doxazosin (CARDURA) 8 MG tablet TAKE 1/2 TABLET BY MOUTH TWICE DAILY  30 tablet  5  . fish oil-omega-3 fatty acids 1000 MG capsule Take 1 g by mouth daily.      . furosemide (LASIX) 80 MG tablet Taking 1 1/2 tablets daily      . glipiZIDE (GLUCOTROL XL) 2.5 MG 24 hr tablet TAKE 1 TABLET BY MOUTH EVERY DAY  30 tablet  5  . Glucosamine-Chondroitin (OSTEO BI-FLEX REGULAR STRENGTH PO) Take by mouth.      . levothyroxine (SYNTHROID, LEVOTHROID) 150 MCG tablet TAKE ONE TABLET BY MOUTH ONCE EVERY MORNING ON AN EMPTY STOMACH.  30 tablet  4  . losartan (COZAAR) 50 MG tablet TAKE 1 TABLET BY MOUTH EVERY DAY  30 tablet  3  . Magnesium 300 MG CAPS Take 300 mg by mouth.      . oxybutynin (DITROPAN) 5 MG tablet Take 5 mg by mouth as needed.        . tobramycin (TOBREX) 0.3 % ophthalmic solution Place 1 drop into the right eye every 6 (six) hours.  5 mL  0  . vitamin B-12 (CYANOCOBALAMIN) 1000 MCG tablet Take 1,000 mcg by mouth daily.      . vitamin C (ASCORBIC ACID) 500 MG tablet Take 500 mg by mouth daily.      Marland Kitchen warfarin (COUMADIN) 2.5 MG tablet Take as directed by anticoagulation clinic  30 tablet  3  . metolazone (ZAROXOLYN) 2.5 MG tablet Take one tablet every other day 30 minutes before taking furosemide  15 tablet  0   No current facility-administered medications for this visit.    Allergies  Allergen Reactions  .  Keflex [Cephalexin] Other (See Comments)  . Codeine   . Ramipril Cough    Past Medical History  Diagnosis Date  . Edema   . Hearing loss   . SOB (shortness of breath)     WITH WALKING  . Difficulty walking   . Skin change   . Chronic atrial fibrillation   . Diabetes mellitus type II, controlled   . Hyperlipidemia   . Depression   . Ischemic heart disease     remote stenting of the RCA in 1998 with residual LAD disease of 60 to 70% with negative nuclear in 2011  . Enlarged RV (right ventricle)     per nuclear in 2011  . Pulmonary hypertension     per echo in 2011  . VHD (valvular heart disease)     per echo in 2011 with EF 50 to 55%, moderate AI, moderate MR, LAE, RAE, moderate TR and peak PA pressures up to 71mm  . Arthritis   . Diverticulitis   . H/O blood clots 1990's    in L leg (related to being in a cast for surgery)  . Sleep apnea   . Unspecified deficiency anemia   . Thrombocytopenia   . Anal fissure   . Anemia of chronic kidney failure   . Neck mass 10/19/2013  . Anemia in chronic kidney disease(285.21) 11/13/2013  . MDS (myelodysplastic syndrome) 12/11/2013    Past Surgical History  Procedure Laterality Date  . Thyroidectomy    . Left wrist surgery    . Achilles tendon repair      Left  . Shrapnel      [place]   . Tonsillectomy and adenoidectomy    . Inguinal hernia repair    . Cataract extraction    . Vasectomy    . Angioplasty    . Colonoscopy  2009    polyps in the past    History  Smoking status  . Former Smoker -- 2.00 packs/day for 1 years  . Types: Cigarettes  . Quit date: 01/01/1952  Smokeless tobacco  . Never Used    History  Alcohol Use  . Yes    Comment: 1 glass wine daily    Family History  Problem Relation Age of Onset  . Heart failure Mother 65  . Diabetes Mother 80  . Heart attack Father 39  . Stroke Father 23  . Aortic aneurysm Father 89    ABDOMINAL  . Cancer Son     ?  Marland Kitchen  Hepatitis Son   . Diabetes Brother      Review of Systems: The review of systems is per the HPI.  All other systems were reviewed and are negative.  Physical Exam: BP 120/80  Pulse 97  Ht 5\' 6"  (1.676 m)  Wt 168 lb 6.4 oz (76.386 kg)  BMI 27.19 kg/m2  SpO2 97% Patient is very pleasant and in no acute distress. Chronically ill. Using his walker. Skin is warm and dry. Color is normal.  HEENT is unremarkable. Normocephalic/atraumatic. PERRL. Sclera are nonicteric. Neck is supple. No masses. No JVD. Lungs are clear with few crackles in the bases. Cardiac exam shows an irregular rhythm. Rate ok today. Murmur noted.  Abdomen is soft. Extremities are with less edema but still 2+. Gait not tested. No gross neurologic deficits noted.  LABORATORY DATA: PENDING  Lab Results  Component Value Date   WBC 5.6 02/24/2014   HGB 9.8* 02/24/2014   HCT 29.3* 02/24/2014   PLT 105 Large platelets present* 02/24/2014   GLUCOSE 113 02/24/2014   CHOL 89 09/16/2013   TRIG 16.0 09/16/2013   HDL 49.80 09/16/2013   LDLCALC 36 09/16/2013   ALT 18 01/15/2014   AST 20 01/15/2014   NA 131* 02/24/2014   K 5.0 02/24/2014   CL 98 02/22/2014   CREATININE 2.2* 02/24/2014   BUN 77.5* 02/24/2014   CO2 21* 02/24/2014   TSH 2.50 09/16/2013   INR 2.4* 02/22/2014   HGBA1C 6.6* 09/16/2013   Echo Study Conclusions from January 2014  - Left ventricle: The cavity size was normal. Wall thickness was normal. Systolic function was normal. The estimated ejection fraction was in the range of 55% to 60%. Wall motion was normal; there were no regional wall motion abnormalities. Doppler parameters are consistent with restrictive physiology, indicative of decreased left ventricular diastolic compliance and/or increased left atrial pressure. - Aortic valve: Trileaflet; moderately calcified leaflets. There was no stenosis. Mild regurgitation. - Ascending aorta: The visualized portion of the ascending aorta was dilated to 4.1 cm. - Mitral valve: Mildly calcified annulus. There  was no evidence for stenosis. Mild regurgitation. - Left atrium: The atrium was severely dilated. - Right ventricle: D-shaped interventricular septum suggestive RV pressure and volume overload. The cavity size was moderately dilated. Systolic function was mildly reduced. - Right atrium: The atrium was severely dilated. - Tricuspid valve: Peak RV-RA gradient: 28mm Hg (S). - Pulmonary arteries: PA peak pressure: 82mm Hg (S). - Systemic veins: IVC dilated to 3.8 cm with no respirophasic variation, suggesting RA pressure 20 mmHg.  Assessment / Plan:  1. Atrial fib - rate slower - Holter with rates down in the 20's and over 3 second pauses - have already shown to Dr. Ladona Ridgel who advises PPM - will arrange visit with Dr. Ladona Ridgel.  2. Swelling - probably multifactorial - he has improved. Will keep him on the 120 mg of Lasix daily. Recheck BMET today.   3. Anemia - with known blood disorder (MDS) [facility] Recent injection last week.   4. Cough - productive of yellow sputum - had pneumonia 2 months ago -given antibiotics - improved.   5. CAD - managed medically. No symptoms reported.   6. Valvular heart disease with normal EF - managed conservatively  7. Pulmonary HTN - managed conservatively  8. Chronic coumadin therapy   Will get him to see Dr. Ladona Ridgel. I will be happy to follow afterwards. Needs BMET today  Patient is agreeable to this plan  and will call if any problems develop in the interim.   Burtis Junes, RN, [place]  [address] [place]  [place], [place] [address]  [phone]

## 2014-03-01 NOTE — Patient Instructions (Signed)
Your physician has recommended that you have a pacemaker inserted. A pacemaker is a small device that is placed under the skin of your chest or abdomen to help control abnormal heart rhythms. This device uses electrical pulses to prompt the heart to beat at a normal rate. Pacemakers are used to treat heart rhythms that are too slow. Wire (leads) are attached to the pacemaker that goes into the chambers of you heart. This is done in the hospital and usually requires and overnight stay. Please see the instruction sheet given to you today for more information.  You are scheduled for 3/17  Your physician recommends that you continue on your current medications as directed. Please refer to the Current Medication list given to you today.  Your physician recommends that you schedule a follow-up appointment on 03/26/14 for wound check.

## 2014-03-01 NOTE — Assessment & Plan Note (Signed)
His ventricular rate is slow and he is on no AV nodal blocking drugs. I have discussed the treatment options with the patient and his wife and the risks/benefits/goals/expectations of PPM have been discussed with the patient and he wishes to proceed.

## 2014-03-01 NOTE — Progress Notes (Signed)
HPI [name] is referred today by Douglas George for evaluation of symptomatic bradycardia due to atrial fibrillation with intermittant CHB. The patient is a pleasant 80 yo man with a h/o chronic atrial fibrillation, HTN, and chronic venous insufficiency resulting in peripheral edema, refractory to medical therapy. Because of dizziness, he has had a cardiac monitor which has demonstrated daytime pauses associate with pauses of over 3 seconds and heart rates in the 20's. He has intermittant CHB in atrial fib. The patient has never had frank syncope. He does have severe fatigue. He is on no reversible medications. Allergies  Allergen Reactions  . Keflex [Cephalexin] Other (See Comments)  . Codeine   . Ramipril Cough     Current Outpatient Prescriptions  Medication Sig Dispense Refill  . ACCU-CHEK AVIVA PLUS test strip TEST ONCE EACH DAY AS DIRECTED  100 each  0  . acetaminophen (TYLENOL) 500 MG tablet Take 1,000 mg by mouth 2 (two) times daily.      . Calcium Carb-Cholecalciferol (CALCIUM 1000 + D) 1000-800 MG-UNIT TABS Take 1 tablet by mouth daily.       . Cholecalciferol (VITAMIN D) 1000 UNITS capsule Take 1,000 Units by mouth daily.       . clotrimazole-betamethasone (LOTRISONE) cream       . CRESTOR 10 MG tablet TAKE ONE TABLET BY MOUTH DAILY  30 tablet  5  . doxazosin (CARDURA) 8 MG tablet TAKE 1/2 TABLET BY MOUTH TWICE DAILY  30 tablet  5  . fish oil-omega-3 fatty acids 1000 MG capsule Take 1 g by mouth daily.      . furosemide (LASIX) 80 MG tablet Taking 1 1/2 tablets daily      . glipiZIDE (GLUCOTROL XL) 2.5 MG 24 hr tablet TAKE 1 TABLET BY MOUTH EVERY DAY  30 tablet  5  . Glucosamine-Chondroitin (OSTEO BI-FLEX REGULAR STRENGTH PO) Take by mouth.      . levothyroxine (SYNTHROID, LEVOTHROID) 150 MCG tablet TAKE ONE TABLET BY MOUTH ONCE EVERY MORNING ON AN EMPTY STOMACH.  30 tablet  4  . losartan (COZAAR) 50 MG tablet TAKE 1 TABLET BY MOUTH EVERY DAY  30 tablet  3  .  Magnesium 300 MG CAPS Take 300 mg by mouth.      . metolazone (ZAROXOLYN) 2.5 MG tablet Take one tablet every other day 30 minutes before taking furosemide  15 tablet  0  . oxybutynin (DITROPAN) 5 MG tablet Take 5 mg by mouth as needed.        . tobramycin (TOBREX) 0.3 % ophthalmic solution Place 1 drop into the right eye every 6 (six) hours.  5 mL  0  . vitamin B-12 (CYANOCOBALAMIN) 1000 MCG tablet Take 1,000 mcg by mouth daily.      . vitamin C (ASCORBIC ACID) 500 MG tablet Take 500 mg by mouth daily.      Marland Kitchen warfarin (COUMADIN) 2.5 MG tablet Take as directed by anticoagulation clinic  30 tablet  3   No current facility-administered medications for this visit.     Past Medical History  Diagnosis Date  . Edema   . Hearing loss   . SOB (shortness of breath)     WITH WALKING  . Difficulty walking   . Skin change   . Chronic atrial fibrillation   . Diabetes mellitus type II, controlled   . Hyperlipidemia   . Depression   . Ischemic heart disease     remote stenting of the RCA  in 1998 with residual LAD disease of 60 to 70% with negative nuclear in 2011  . Enlarged RV (right ventricle)     per nuclear in 2011  . Pulmonary hypertension     per echo in 2011  . VHD (valvular heart disease)     per echo in 2011 with EF 50 to 55%, moderate AI, moderate MR, LAE, RAE, moderate TR and peak PA pressures up to 35mm  . Arthritis   . Diverticulitis   . H/O blood clots 1990's    in L leg (related to being in a cast for surgery)  . Sleep apnea   . Unspecified deficiency anemia   . Thrombocytopenia   . Anal fissure   . Anemia of chronic kidney failure   . Neck mass 10/19/2013  . Anemia in chronic kidney disease(285.21) 11/13/2013  . MDS (myelodysplastic syndrome) 12/11/2013    ROS:   All systems reviewed and negative except as noted in the HPI.   Past Surgical History  Procedure Laterality Date  . Thyroidectomy    . Left wrist surgery    . Achilles tendon repair      Left  .  Shrapnel      [place]   . Tonsillectomy and adenoidectomy    . Inguinal hernia repair    . Cataract extraction    . Vasectomy    . Angioplasty    . Colonoscopy  2009    polyps in the past     Family History  Problem Relation Age of Onset  . Heart failure Mother 32  . Diabetes Mother 76  . Heart attack Father 83  . Stroke Father 31  . Aortic aneurysm Father 67    ABDOMINAL  . Cancer Son     ?  Marland Kitchen Hepatitis Son   . Diabetes Brother      History   Social History  . Marital Status: Married    Spouse Name: N/A    Number of Children: 6  . Years of Education: 16+   Occupational History  . Retired     Pharmacist, hospital   Social History Main Topics  . Smoking status: Former Smoker -- 2.00 packs/day for 1 years    Types: Cigarettes    Quit date: 01/01/1952  . Smokeless tobacco: Never Used  . Alcohol Use: Yes     Comment: 1 glass wine daily  . Drug Use: No  . Sexual Activity: Not Currently   Other Topics Concern  . Not on file   Social History Narrative   No caffeine use   Regular exercise-no     Ht 5\' 6"  (1.676 m)  Wt 168 lb 6.4 oz (76.386 kg)  BMI 27.19 kg/m2  Physical Exam:  stable appearing elderly man, NAD HEENT: Unremarkable Neck:  No JVD, no thyromegally Back:  No CVA tenderness Lungs:  Clear with no wheezes HEART:  IRegular brady rhythm, no murmurs, no rubs, no clicks Abd:  soft, positive bowel sounds, no organomegally, no rebound, no guarding Ext:  2 plus pulses, no edema, no cyanosis, no clubbing Skin:  No rashes no nodules Neuro:  CN II through XII intact, motor grossly intact  EKG - atrial fib with a slow VR   Assess/Plan:

## 2014-03-01 NOTE — Assessment & Plan Note (Signed)
He denies anginal symptoms. He will be able to have his medications adjusted once his PPM is placed.

## 2014-03-02 LAB — BASIC METABOLIC PANEL
BUN: 73 mg/dL — AB (ref 6–23)
CO2: 25 mEq/L (ref 19–32)
Calcium: 8.9 mg/dL (ref 8.4–10.5)
Chloride: 96 mEq/L (ref 96–112)
Creatinine, Ser: 2.4 mg/dL — ABNORMAL HIGH (ref 0.4–1.5)
GFR: 27.5 mL/min — AB (ref >60.00)
Glucose, Bld: 105 mg/dL — ABNORMAL HIGH (ref 70–99)
POTASSIUM: 4.3 meq/L (ref 3.5–5.1)
SODIUM: 129 meq/L — AB (ref 135–145)

## 2014-03-02 LAB — CBC WITH DIFFERENTIAL/PLATELET
BASOS PCT: 0.7 % (ref 0.0–3.0)
Basophils Absolute: 0 10*3/uL (ref 0.0–0.1)
EOS ABS: 0.3 10*3/uL (ref 0.0–0.7)
Eosinophils Relative: 4 % (ref 0.0–5.0)
HCT: 30.7 % — ABNORMAL LOW (ref 39.0–52.0)
Hemoglobin: 10 g/dL — ABNORMAL LOW (ref 13.0–17.0)
Lymphocytes Relative: 12 % (ref 12.0–46.0)
Lymphs Abs: 0.8 10*3/uL (ref 0.7–4.0)
MCHC: 32.5 g/dL (ref 30.0–36.0)
MCV: 100.3 fl — AB (ref 78.0–100.0)
MONO ABS: 0.9 10*3/uL (ref 0.1–1.0)
Monocytes Relative: 12.8 % — ABNORMAL HIGH (ref 3.0–12.0)
Neutro Abs: 4.9 10*3/uL (ref 1.4–7.7)
Neutrophils Relative %: 70.5 % (ref 43.0–77.0)
Platelets: 145 10*3/uL — ABNORMAL LOW (ref 150.0–400.0)
RBC: 3.06 Mil/uL — ABNORMAL LOW (ref 4.22–5.81)
RDW: 17.6 % — AB (ref 11.5–14.6)
WBC: 6.9 10*3/uL (ref 4.5–10.5)

## 2014-03-02 LAB — PROTIME-INR
INR: 2 ratio — ABNORMAL HIGH (ref 0.8–1.0)
PROTHROMBIN TIME: 21.3 s — AB (ref 10.2–12.4)

## 2014-03-03 ENCOUNTER — Other Ambulatory Visit: Payer: Self-pay | Admitting: Endocrinology

## 2014-03-05 ENCOUNTER — Telehealth: Payer: Self-pay | Admitting: Cardiology

## 2014-03-05 NOTE — Telephone Encounter (Signed)
Will forward to [place] for her knowledge

## 2014-03-05 NOTE — Telephone Encounter (Signed)
New message   Patient wife calling - patient decided put the procedure on hold until April.

## 2014-03-08 ENCOUNTER — Other Ambulatory Visit: Payer: Medicare Other

## 2014-03-08 ENCOUNTER — Telehealth: Payer: Self-pay | Admitting: Internal Medicine

## 2014-03-08 NOTE — Telephone Encounter (Signed)
Follow up    Pt's wife called to reschedule pt's pacer implant.   Please give her a call back to do so.

## 2014-03-08 NOTE — Telephone Encounter (Signed)
Spoke with karen at hospital and have canceled procedure

## 2014-03-08 NOTE — Telephone Encounter (Signed)
Rescheduled to 04/21/14 with labs on 04/14/14  Daughter aware.  She is wanting a referral to hospice for her Dad as she says she wants him evaluated prior to the implant. She says Douglas Merle, NP discussed this with them.  I have looked and let her know I did not see mention of this in her note but would be glad to discuss.  If referral is indicated would need to come from his primary MD or Cardiologist if related to his heart.  She is talking more about him being unable to raise his arms due to rotator cuff problems and then he also has recurrent CA.  I let her know that Dr Lovena Le would not be the one to do this referral

## 2014-03-10 ENCOUNTER — Other Ambulatory Visit: Payer: Self-pay | Admitting: *Deleted

## 2014-03-10 DIAGNOSIS — I459 Conduction disorder, unspecified: Secondary | ICD-10-CM

## 2014-03-10 NOTE — Telephone Encounter (Signed)
Cory from Hospice aware and Kathrene Alu has approved.  He is going to contact patient

## 2014-03-11 ENCOUNTER — Ambulatory Visit: Payer: Medicare Other | Admitting: Endocrinology

## 2014-03-16 ENCOUNTER — Encounter (HOSPITAL_COMMUNITY): Admission: RE | Payer: Self-pay | Source: Ambulatory Visit

## 2014-03-16 ENCOUNTER — Ambulatory Visit (HOSPITAL_COMMUNITY): Admission: RE | Admit: 2014-03-16 | Payer: Medicare Other | Source: Ambulatory Visit | Admitting: Internal Medicine

## 2014-03-16 ENCOUNTER — Ambulatory Visit (INDEPENDENT_AMBULATORY_CARE_PROVIDER_SITE_OTHER): Payer: Medicare Other | Admitting: General Practice

## 2014-03-16 DIAGNOSIS — I4891 Unspecified atrial fibrillation: Secondary | ICD-10-CM

## 2014-03-16 DIAGNOSIS — Z5181 Encounter for therapeutic drug level monitoring: Secondary | ICD-10-CM

## 2014-03-16 LAB — POCT INR: INR: 1.5

## 2014-03-16 SURGERY — PERMANENT PACEMAKER INSERTION
Anesthesia: LOCAL

## 2014-03-16 NOTE — Progress Notes (Signed)
Pre visit review using our clinic review tool, if applicable. No additional management support is needed unless otherwise documented below in the visit note. 

## 2014-03-17 ENCOUNTER — Telehealth: Payer: Self-pay | Admitting: Hematology and Oncology

## 2014-03-17 NOTE — Telephone Encounter (Signed)
pt called and needed sooner appt....pt wanted 4.10 and 4.16 appt moved together...done....aware of new d.t

## 2014-03-19 ENCOUNTER — Ambulatory Visit: Payer: Medicare Other

## 2014-03-19 ENCOUNTER — Other Ambulatory Visit: Payer: Medicare Other

## 2014-03-28 ENCOUNTER — Other Ambulatory Visit: Payer: Self-pay | Admitting: Hematology and Oncology

## 2014-03-29 ENCOUNTER — Other Ambulatory Visit: Payer: Self-pay | Admitting: Hematology and Oncology

## 2014-03-29 ENCOUNTER — Ambulatory Visit: Payer: Medicare Other

## 2014-03-29 DIAGNOSIS — N183 Chronic kidney disease, stage 3 unspecified: Secondary | ICD-10-CM

## 2014-03-29 DIAGNOSIS — D469 Myelodysplastic syndrome, unspecified: Secondary | ICD-10-CM

## 2014-03-30 ENCOUNTER — Ambulatory Visit (HOSPITAL_BASED_OUTPATIENT_CLINIC_OR_DEPARTMENT_OTHER): Payer: Medicare Other | Admitting: Hematology and Oncology

## 2014-03-30 ENCOUNTER — Telehealth: Payer: Self-pay | Admitting: Hematology and Oncology

## 2014-03-30 ENCOUNTER — Other Ambulatory Visit (HOSPITAL_BASED_OUTPATIENT_CLINIC_OR_DEPARTMENT_OTHER): Payer: Medicare Other

## 2014-03-30 ENCOUNTER — Encounter: Payer: Self-pay | Admitting: Hematology and Oncology

## 2014-03-30 ENCOUNTER — Ambulatory Visit

## 2014-03-30 VITALS — BP 140/47 | HR 65 | Temp 98.1°F | Resp 18 | Ht 66.0 in | Wt 163.2 lb

## 2014-03-30 DIAGNOSIS — N183 Chronic kidney disease, stage 3 unspecified: Secondary | ICD-10-CM

## 2014-03-30 DIAGNOSIS — N189 Chronic kidney disease, unspecified: Secondary | ICD-10-CM

## 2014-03-30 DIAGNOSIS — D469 Myelodysplastic syndrome, unspecified: Secondary | ICD-10-CM

## 2014-03-30 DIAGNOSIS — D696 Thrombocytopenia, unspecified: Secondary | ICD-10-CM

## 2014-03-30 DIAGNOSIS — D649 Anemia, unspecified: Secondary | ICD-10-CM

## 2014-03-30 DIAGNOSIS — I509 Heart failure, unspecified: Secondary | ICD-10-CM

## 2014-03-30 DIAGNOSIS — D631 Anemia in chronic kidney disease: Secondary | ICD-10-CM

## 2014-03-30 DIAGNOSIS — N039 Chronic nephritic syndrome with unspecified morphologic changes: Secondary | ICD-10-CM

## 2014-03-30 DIAGNOSIS — D630 Anemia in neoplastic disease: Secondary | ICD-10-CM

## 2014-03-30 LAB — CBC WITH DIFFERENTIAL/PLATELET
BASO%: 1.1 % (ref 0.0–2.0)
BASOS ABS: 0.1 10*3/uL (ref 0.0–0.1)
EOS%: 4 % (ref 0.0–7.0)
Eosinophils Absolute: 0.2 10*3/uL (ref 0.0–0.5)
HCT: 31.1 % — ABNORMAL LOW (ref 38.4–49.9)
HEMOGLOBIN: 10.1 g/dL — AB (ref 13.0–17.1)
LYMPH%: 13.4 % — ABNORMAL LOW (ref 14.0–49.0)
MCH: 31.9 pg (ref 27.2–33.4)
MCHC: 32.4 g/dL (ref 32.0–36.0)
MCV: 98.2 fL — AB (ref 79.3–98.0)
MONO#: 0.9 10*3/uL (ref 0.1–0.9)
MONO%: 14.4 % — AB (ref 0.0–14.0)
NEUT#: 4 10*3/uL (ref 1.5–6.5)
NEUT%: 67.1 % (ref 39.0–75.0)
Platelets: 123 10*3/uL — ABNORMAL LOW (ref 140–400)
RBC: 3.16 10*6/uL — ABNORMAL LOW (ref 4.20–5.82)
RDW: 17 % — ABNORMAL HIGH (ref 11.0–14.6)
WBC: 6 10*3/uL (ref 4.0–10.3)
lymph#: 0.8 10*3/uL — ABNORMAL LOW (ref 0.9–3.3)

## 2014-03-30 LAB — BASIC METABOLIC PANEL (CC13)
Anion Gap: 11 mEq/L (ref 3–11)
BUN: 72.6 mg/dL — ABNORMAL HIGH (ref 7.0–26.0)
CALCIUM: 8.8 mg/dL (ref 8.4–10.4)
CO2: 21 meq/L — AB (ref 22–29)
Chloride: 105 mEq/L (ref 98–109)
Creatinine: 2 mg/dL — ABNORMAL HIGH (ref 0.7–1.3)
Glucose: 91 mg/dl (ref 70–140)
Potassium: 4.5 mEq/L (ref 3.5–5.1)
SODIUM: 137 meq/L (ref 136–145)

## 2014-03-30 MED ORDER — DARBEPOETIN ALFA-POLYSORBATE 300 MCG/0.6ML IJ SOLN: 300.0000 ug | Freq: Once | INTRAMUSCULAR | Status: DC

## 2014-03-30 MED ORDER — DARBEPOETIN ALFA-POLYSORBATE 300 MCG/0.6ML IJ SOLN
300.0000 ug | Freq: Once | INTRAMUSCULAR | Status: AC
  Administered 2014-03-30: 300 ug via SUBCUTANEOUS
  Filled 2014-03-30: qty 0.6

## 2014-03-30 NOTE — Progress Notes (Signed)
[facility] OFFICE PROGRESS NOTE  Cathlean Cower, MD DIAGNOSIS:  Severe anemia and mild thrombocytopenia due to myelodysplastic syndrome, on erythropoietin stimulating agents  SUMMARY OF HEMATOLOGIC HISTORY: This is a patient who has been followed here for many years. He had history of chronic anemia for some time and had a bone marrow aspirate and biopsy done in 2007 that was unremarkable. Due to chronic anemia and chronic kidney disease he was referred to the [facility] to receive erythropoietin stimulating agents. Due to anemia, he had received one dose of intravenous iron infusion in January 2014 and darbepoetin injection since June of 2014. On 10/30/2013, he had bone marrow aspirate and biopsy showed dyspoietic changes suggested for early myelodysplastic syndrome. We resume erythropoietin stimulating agents every other week to keep hemoglobin above 10 g  INTERVAL HISTORY: Douglas George 80 y.o. male returns for further followup. He has gained almost 20 pounds of fluid weight and according to his wife, there is a pleasant 40 stomach the implantation in about 3 weeks. He has shortness of breath on moderate exertion. Complained of bilateral lower extremity edema. He denies any chest pain or dizziness. He denies any recent new skin lesions. The patient denies any recent signs or symptoms of bleeding such as spontaneous epistaxis, hematuria or hematochezia.  I have reviewed the past medical history, past surgical history, social history and family history with the patient and they are unchanged from previous note.  ALLERGIES:  is allergic to keflex; codeine; and ramipril.  MEDICATIONS:  Current Outpatient Prescriptions  Medication Sig Dispense Refill  . ACCU-CHEK AVIVA PLUS test strip USE TO TEST ONCE DAILY AS DIRECTED  50 each  5  . acetaminophen (TYLENOL) 500 MG tablet Take 1,000 mg by mouth as needed.       . Calcium Carb-Cholecalciferol (CALCIUM 1000 + D) 1000-800  MG-UNIT TABS Take 1 tablet by mouth daily.       . Cholecalciferol (VITAMIN D) 1000 UNITS capsule Take 1,000 Units by mouth daily.       . clotrimazole-betamethasone (LOTRISONE) cream       . doxazosin (CARDURA) 8 MG tablet TAKE 1/2 TABLET BY MOUTH TWICE DAILY  30 tablet  5  . fish oil-omega-3 fatty acids 1000 MG capsule Take 1 g by mouth daily.      . furosemide (LASIX) 80 MG tablet Taking 1 1/2 tablets daily      . glipiZIDE (GLUCOTROL XL) 2.5 MG 24 hr tablet TAKE 1 TABLET BY MOUTH EVERY DAY  30 tablet  5  . Glucosamine-Chondroitin (OSTEO BI-FLEX REGULAR STRENGTH PO) Take by mouth.      . levothyroxine (SYNTHROID, LEVOTHROID) 150 MCG tablet TAKE ONE TABLET BY MOUTH ONCE EVERY MORNING ON AN EMPTY STOMACH.  30 tablet  4  . losartan (COZAAR) 50 MG tablet TAKE 1 TABLET BY MOUTH EVERY DAY  30 tablet  3  . Magnesium 300 MG CAPS Take 300 mg by mouth.      . oxybutynin (DITROPAN) 5 MG tablet Take 5 mg by mouth as needed.        . tobramycin (TOBREX) 0.3 % ophthalmic solution PLACE 1 DROP INTO RIGHT EYE EVERY 6 HOURS  5 mL  0  . vitamin B-12 (CYANOCOBALAMIN) 1000 MCG tablet Take 1,000 mcg by mouth daily.      . vitamin C (ASCORBIC ACID) 500 MG tablet Take 500 mg by mouth daily.      Marland Kitchen warfarin (COUMADIN) 2.5 MG tablet Take as directed by  anticoagulation clinic  30 tablet  3   No current facility-administered medications for this visit.   Facility-Administered Medications Ordered in Other Visits  Medication Dose Route Frequency Provider Last Rate Last Dose  . darbepoetin (ARANESP) injection 300 mcg  300 mcg Subcutaneous Once Heath Lark, MD         REVIEW OF SYSTEMS:   Constitutional: Denies fevers, chills or night sweats Eyes: Denies blurriness of vision Ears, nose, mouth, throat, and face: Denies mucositis or sore throat Respiratory: Denies cough or wheezes Gastrointestinal:  Denies nausea, heartburn or change in bowel habits Skin: Denies abnormal skin rashes Lymphatics: Denies new  lymphadenopathy or easy bruising Neurological:Denies numbness, tingling or new weaknesses Behavioral/Psych: Mood is stable, no new changes  All other systems were reviewed with the patient and are negative.  PHYSICAL EXAMINATION: ECOG PERFORMANCE STATUS: 1 - Symptomatic but completely ambulatory  Filed Vitals:   03/30/14 1305  BP: 140/47  Pulse: 65  Temp: 98.1 F (36.7 C)  Resp: 18   Filed Weights   03/30/14 1305  Weight: 163 lb 3.2 oz (74.027 kg)    GENERAL:alert, no distress and comfortable. He looked elderly in no acute distress SKIN: There are diffuse keratosis throughout  EYES: normal, Conjunctiva are pink and non-injected, sclera clear OROPHARYNX:no exudate, no erythema and lips, buccal mucosa, and tongue normal  NECK: supple, thyroid normal size, non-tender, without nodularity LYMPH:  no palpable lymphadenopathy in the cervical, axillary or inguinal LUNGS: clear to auscultation and percussion with normal breathing effort HEART: regular rate & rhythm with moderate bilateral lower extremity edema ABDOMEN:abdomen soft, non-tender and normal bowel sounds Musculoskeletal:no cyanosis of digits and no clubbing  NEURO: alert & oriented x 3 with fluent speech, no focal motor/sensory deficits  LABORATORY DATA:  I have reviewed the data as listed Results for orders placed in visit on 03/30/14 (from the past 48 hour(s))  BASIC METABOLIC PANEL (ZS01)     Status: Abnormal   Collection Time    03/30/14 12:48 PM      Result Value Ref Range   Sodium 137  136 - 145 mEq/L   Potassium 4.5  3.5 - 5.1 mEq/L   Chloride 105  98 - 109 mEq/L   CO2 21 (*) 22 - 29 mEq/L   Glucose 91  70 - 140 mg/dl   BUN 72.6 (*) 7.0 - 26.0 mg/dL   Creatinine 2.0 (*) 0.7 - 1.3 mg/dL   Calcium 8.8  8.4 - 10.4 mg/dL   Anion Gap 11  3 - 11 mEq/L  CBC WITH DIFFERENTIAL     Status: Abnormal   Collection Time    03/30/14 12:49 PM      Result Value Ref Range   WBC 6.0  4.0 - 10.3 10e3/uL   NEUT# 4.0  1.5 -  6.5 10e3/uL   HGB 10.1 (*) 13.0 - 17.1 g/dL   HCT 31.1 (*) 38.4 - 49.9 %   Platelets 123 (*) 140 - 400 10e3/uL   MCV 98.2 (*) 79.3 - 98.0 fL   MCH 31.9  27.2 - 33.4 pg   MCHC 32.4  32.0 - 36.0 g/dL   RBC 3.16 (*) 4.20 - 5.82 10e6/uL   RDW 17.0 (*) 11.0 - 14.6 %   lymph# 0.8 (*) 0.9 - 3.3 10e3/uL   MONO# 0.9  0.1 - 0.9 10e3/uL   Eosinophils Absolute 0.2  0.0 - 0.5 10e3/uL   Basophils Absolute 0.1  0.0 - 0.1 10e3/uL   NEUT% 67.1  39.0 -  75.0 %   LYMPH% 13.4 (*) 14.0 - 49.0 %   MONO% 14.4 (*) 0.0 - 14.0 %   EOS% 4.0  0.0 - 7.0 %   BASO% 1.1  0.0 - 2.0 %    Lab Results  Component Value Date   WBC 6.0 03/30/2014   HGB 10.1* 03/30/2014   HCT 31.1* 03/30/2014   MCV 98.2* 03/30/2014   PLT 123* 03/30/2014   ASSESSMENT & PLAN:  #1 myelodysplastic syndrome His bone marrow aspirate and biopsy was abnormal. At present time, his calculated IPS test score is low. Survival is still measured in years. I recommend we continue erythropoietin stimulating agents to keep hemoglobin above 10 g. Due to the anticipated pacemaker implantation, I recommend giving him Aranesp today. I will see him back in one week after his pacemaker implantation to follow on his anemia. The patient and his wife is aware he may need blood transfusion. #2 anemia This is likely anemia of chronic disease from his chronic renal failure and MDS. The patient denies recent history of bleeding such as epistaxis, hematuria or hematochezia. He is asymptomatic from the anemia. We will observe for now.  He does not require transfusion now.  I recommend we continue Aranesp injection.  Goal of therapy is to keep hemoglobin above 10 g #3 chronic thrombocytopenia The cause is due to MDS. He is not symptomatic. We will observe only. #4 chronic kidney disease This is stable. I noted very high BUN. He will follow with his cardiologist and nephrologist #5 heart failure with cardiomyopathy Her cardiologist recommendation, the patient will  proceed with pacemaker implantation within the next 3 weeks. Continue supportive care.  All questions were answered. The patient knows to call the clinic with any problems, questions or concerns. No barriers to learning was detected.  I spent 25 minutes counseling the patient face to face. The total time spent in the appointment was 30 minutes and more than 50% was on counseling.     [place], [place], MD 03/30/2014 1:35 PM

## 2014-03-30 NOTE — Telephone Encounter (Signed)
gv and printed appt sched and avs for pt for April  °

## 2014-03-30 NOTE — Addendum Note (Signed)
Addended by: Neysa Hotter on: 03/30/2014 01:34 PM   Modules accepted: Orders

## 2014-04-06 ENCOUNTER — Telehealth: Payer: Self-pay | Admitting: Internal Medicine

## 2014-04-06 NOTE — Telephone Encounter (Addendum)
Labs on 04/14/14  Daughter aware of date and time

## 2014-04-06 NOTE — Telephone Encounter (Signed)
New Message  Pt wife called to discuss Permanent Transvenous Pacemaker directives. Request a call back to discuss, please call

## 2014-04-09 ENCOUNTER — Ambulatory Visit: Payer: Medicare Other

## 2014-04-09 ENCOUNTER — Other Ambulatory Visit: Payer: Medicare Other

## 2014-04-09 ENCOUNTER — Encounter (HOSPITAL_COMMUNITY): Payer: Self-pay | Admitting: Pharmacy Technician

## 2014-04-09 ENCOUNTER — Ambulatory Visit: Payer: Medicare Other | Admitting: Nurse Practitioner

## 2014-04-13 ENCOUNTER — Ambulatory Visit (INDEPENDENT_AMBULATORY_CARE_PROVIDER_SITE_OTHER): Payer: Medicare Other | Admitting: Emergency Medicine

## 2014-04-13 ENCOUNTER — Encounter: Payer: Self-pay | Admitting: Emergency Medicine

## 2014-04-13 VITALS — BP 120/68 | HR 41 | Ht 66.0 in | Wt 168.0 lb

## 2014-04-13 DIAGNOSIS — G4730 Sleep apnea, unspecified: Secondary | ICD-10-CM

## 2014-04-13 DIAGNOSIS — I2789 Other specified pulmonary heart diseases: Secondary | ICD-10-CM

## 2014-04-13 DIAGNOSIS — I272 Pulmonary hypertension, unspecified: Secondary | ICD-10-CM

## 2014-04-13 NOTE — Patient Instructions (Addendum)
Please take lasix 80mg  + 40mg  for the next 3 days, then go back to 80mg  daily Continue your BiPAP every night Get your pacemaker placed as planned Take fluticasone 2 sprays each nostril 1 or 2 times a day for allergies.  Follow with Dr Lamonte Sakai in 4 months or sooner if you have any problems.

## 2014-04-13 NOTE — Assessment & Plan Note (Signed)
-   continue BiPAP  

## 2014-04-13 NOTE — Assessment & Plan Note (Signed)
-   will increase lasix for 3 days then back to 80mg  daily - continue BiPAP  - suspect that pacer will help  - rov 4

## 2014-04-13 NOTE — Progress Notes (Signed)
Subjective:    Patient ID: Douglas George, male    DOB: 04-Sep-1927, 80 y.o.   MRN: [id number]  HPI 80 yo former low exposure smoker (2 pk-yrs), hx DM, OSA (dx 15 yrs ago, not on CPAP),  LE DVT (his wife says these were while he was therapeutic on coumadin), HTN with restrictive diastolic dysfxn, CAD (RCA stent '98, LAD dz), moderate AI and MR, RV dilation and PAH by TTE in 2011 (estimated PASP 80mmHg) and then confirmed on TTE January 2/14 (estimated PASP 86 mmHg). Followed by Dr Percival Spanish for these issues and A fib on coumadin.  His most recent TTE was performed to evaluate LE edema, has improved since lasix was increased 1/16. He denies any dyspnea, although activity is limited by his joint disease.   ROV 03/25/13 -- follows up for secondary PAH in setting untreated OSA, chronic VTE, L sided heart disease (CAD, diastolic dysfxn, valvular disease). He underwent full PFT today >> Mild (but real) AFL without BD response, normal volumes, decrease DLCO. Last visit dsat to 90% after 2 laps on RA, no overt drop to <88. Sleep study done but not yet available.  V/Q read as normal on 03/02/13 (has been on anti-coagulation).  Auto-immune panel is negative (2/25).  He is wondering about whether he should get the R heart cath.   ROV 05/15/13 -- Follows for his secondary PAH with w/u and eval as above. He has had his CPAP titration study, results not yet available. Since last time he unfortunately was admitted in [facility] for UTI and heat exhaustion.   ROV 07/1913 -- secondary [place] identified on TTE last done 2/14. He underwent PSG, has started auto-set BiPAP (5-25), . Returns for follow up. Compliance data is available > shows that he is using > 4 hours 73 % of the time, used it every night. Data recommends starting with 16/12 but there was significant leakage. (SLE 1006 is mask fitting). He feels much better since starting the NIPPV. Less exertional SOB, no longer napping.   ROV 12/07/13 -- secondary PAH, OSA on BiPAP with  moderate compliance. Was seen last month by Dr Gwenette Greet with persistent UA cough. ? The inciting event, was treated w levaquin, seemed to be driven by his ramipril. His breathing is better, has improved since his diuretics were increased at Cardiology.    ROV 04/13/14 -- secondary PAH, OSA on BiPAP. Returns for f/u. Reports that he has been wearing biPAP reliably. He has been having allergies, itchy eyes, more mucous, scratchy throat. He is more SOB last 2 -3 days. He is scheduled for a pacer for A Fib with periods of brady. Suspect this contributes to his PAH. He has B LE edema for the last 3-4 days.     Review of Systems  Constitutional: Positive for unexpected weight change. Negative for fever.  HENT: Positive for trouble swallowing. Negative for congestion, dental problem, ear pain, nosebleeds, postnasal drip, rhinorrhea, sinus pressure, sneezing and sore throat.   Eyes: Negative for redness and itching.  Respiratory: Positive for cough. Negative for chest tightness, shortness of breath and wheezing.   Cardiovascular: Negative for palpitations and leg swelling.  Gastrointestinal: Negative for nausea and vomiting.  Genitourinary: Negative for dysuria.  Musculoskeletal: Negative for joint swelling.  Skin: Negative for rash.  Neurological: Negative for headaches.  Hematological: Does not bruise/bleed easily.  Psychiatric/Behavioral: Negative for dysphoric mood. The patient is not nervous/anxious.     V/Q scan 03/02/13 --  Findings: The ventilation scan is normal.  No ventilation defects.  The perfusion lung scan is normal. No segmental or subsegmental  defects to suggest pulmonary embolism.  IMPRESSION:  Normal ventilation perfusion lung scan       Objective:   Physical Exam Filed Vitals:   04/13/14 1539  BP: 120/68  Pulse: 41  Gen: Pleasant, elderly somewhat debilitated, in no distress,  normal affect, uses walker  ENT: No lesions,  mouth clear,  Single prominent telangectasia  underneath tongue  Neck: No JVD, no TMG, no carotid bruits  Lungs: No use of accessory muscles, no dullness to percussion, clear without rales or rhonchi  Cardiovascular: irregular, heart sounds normal, no murmur or gallops,   Musculoskeletal: No deformities, no cyanosis or clubbing  Neuro: alert, non focal  Skin: Warm, no lesions or rashes, no edema.   TTE 01/16/13 --  - Left ventricle: The cavity size was normal. Wall thickness was normal. Systolic function was normal. The estimated ejection fraction was in the range of 55% to 60%. Wall motion was normal; there were no regional wall motion abnormalities. Doppler parameters are consistent with restrictive physiology, indicative of decreased left ventricular diastolic compliance and/or increased left atrial pressure. - Aortic valve: Trileaflet; moderately calcified leaflets. There was no stenosis. Mild regurgitation. - Ascending aorta: The visualized portion of the ascending aorta was dilated to 4.1 cm. - Mitral valve: Mildly calcified annulus. There was no evidence for stenosis. Mild regurgitation. - Left atrium: The atrium was severely dilated. - Right ventricle: D-shaped interventricular septum suggestive RV pressure and volume overload. The cavity size was moderately dilated. Systolic function was mildly reduced. - Right atrium: The atrium was severely dilated. - Tricuspid valve: Peak RV-RA gradient: 56mm Hg (S). - Pulmonary arteries: PA peak pressure: 55mm Hg (S). - Systemic veins: IVC dilated to 3.8 cm with no respirophasic variation, suggesting RA pressure 20 mmHg. Impressions:  - Normal LV size and systolic function, EF 65-68%. Restrictive diastolic function. Moderately dilated RV with mildly decreased systolic function. Severe pulmonary hypertension. Dilated IVC suggesting elevated RV filling pressure.      Assessment & Plan:  Unspecified sleep apnea - continue BiPAP  Pulmonary hypertension - will  increase lasix for 3 days then back to 80mg  daily - continue BiPAP  - suspect that pacer will help  - rov 4

## 2014-04-14 ENCOUNTER — Ambulatory Visit: Payer: Medicare Other | Admitting: Endocrinology

## 2014-04-14 ENCOUNTER — Ambulatory Visit (INDEPENDENT_AMBULATORY_CARE_PROVIDER_SITE_OTHER): Payer: Medicare Other | Admitting: *Deleted

## 2014-04-14 DIAGNOSIS — E785 Hyperlipidemia, unspecified: Secondary | ICD-10-CM

## 2014-04-14 DIAGNOSIS — I2789 Other specified pulmonary heart diseases: Secondary | ICD-10-CM

## 2014-04-14 DIAGNOSIS — I482 Chronic atrial fibrillation, unspecified: Secondary | ICD-10-CM

## 2014-04-14 DIAGNOSIS — R609 Edema, unspecified: Secondary | ICD-10-CM

## 2014-04-14 DIAGNOSIS — I517 Cardiomegaly: Secondary | ICD-10-CM

## 2014-04-14 DIAGNOSIS — I259 Chronic ischemic heart disease, unspecified: Secondary | ICD-10-CM

## 2014-04-14 DIAGNOSIS — I4891 Unspecified atrial fibrillation: Secondary | ICD-10-CM

## 2014-04-14 DIAGNOSIS — I272 Pulmonary hypertension, unspecified: Secondary | ICD-10-CM

## 2014-04-14 LAB — PROTIME-INR
INR: 2.1 ratio — ABNORMAL HIGH (ref 0.8–1.0)
Prothrombin Time: 21.4 s — ABNORMAL HIGH (ref 10.2–12.4)

## 2014-04-14 LAB — CBC WITH DIFFERENTIAL/PLATELET
BASOS ABS: 0 10*3/uL (ref 0.0–0.1)
BASOS PCT: 0.4 % (ref 0.0–3.0)
EOS ABS: 0.2 10*3/uL (ref 0.0–0.7)
Eosinophils Relative: 3.9 % (ref 0.0–5.0)
HCT: 32.9 % — ABNORMAL LOW (ref 39.0–52.0)
Hemoglobin: 10.9 g/dL — ABNORMAL LOW (ref 13.0–17.0)
LYMPHS PCT: 20.4 % (ref 12.0–46.0)
Lymphs Abs: 1.2 10*3/uL (ref 0.7–4.0)
MCHC: 33 g/dL (ref 30.0–36.0)
MCV: 97.9 fl (ref 78.0–100.0)
MONO ABS: 0.6 10*3/uL (ref 0.1–1.0)
Monocytes Relative: 10.2 % (ref 3.0–12.0)
NEUTROS ABS: 3.8 10*3/uL (ref 1.4–7.7)
Neutrophils Relative %: 65.1 % (ref 43.0–77.0)
Platelets: 116 10*3/uL — ABNORMAL LOW (ref 150.0–400.0)
RBC: 3.36 Mil/uL — AB (ref 4.22–5.81)
RDW: 18 % — AB (ref 11.5–14.6)
WBC: 5.8 10*3/uL (ref 4.5–10.5)

## 2014-04-14 LAB — BASIC METABOLIC PANEL
BUN: 69 mg/dL — ABNORMAL HIGH (ref 6–23)
CHLORIDE: 101 meq/L (ref 96–112)
CO2: 26 meq/L (ref 19–32)
CREATININE: 2.3 mg/dL — AB (ref 0.4–1.5)
Calcium: 9 mg/dL (ref 8.4–10.5)
GFR: 28.3 mL/min — ABNORMAL LOW (ref >60.00)
Glucose, Bld: 127 mg/dL — ABNORMAL HIGH (ref 70–99)
Potassium: 4.6 mEq/L (ref 3.5–5.1)
Sodium: 135 mEq/L (ref 135–145)

## 2014-04-15 ENCOUNTER — Ambulatory Visit: Payer: Medicare Other

## 2014-04-15 ENCOUNTER — Ambulatory Visit: Payer: Medicare Other | Admitting: Hematology and Oncology

## 2014-04-15 ENCOUNTER — Other Ambulatory Visit: Payer: Medicare Other

## 2014-04-16 ENCOUNTER — Telehealth: Payer: Self-pay | Admitting: Cardiology

## 2014-04-16 NOTE — Telephone Encounter (Signed)
Called [name] and advised her to have him hold Coumadin just 1 day prior to procedure since INR is 2.1. (reviewed with Janan Halter RN Luella Cook for Dr.Taylor). He will also hold Lasix, Glucotrol and vitamins/supplements day of procedure.

## 2014-04-16 NOTE — Telephone Encounter (Signed)
New Message  Pt wife called. Requests a call back to discuss when the pt should come off of his coumadin.Marland Kitchen Please call back to discuss

## 2014-04-18 ENCOUNTER — Other Ambulatory Visit: Payer: Self-pay | Admitting: Cardiovascular Disease

## 2014-04-20 MED ORDER — SODIUM CHLORIDE 0.9 % IV SOLN
INTRAVENOUS | Status: DC
  Administered 2014-04-21: 07:00:00 via INTRAVENOUS

## 2014-04-20 MED ORDER — VANCOMYCIN HCL IN DEXTROSE 1-5 GM/200ML-% IV SOLN
1000.0000 mg | INTRAVENOUS | Status: DC
  Filled 2014-04-20 (×2): qty 200

## 2014-04-20 MED ORDER — CHLORHEXIDINE GLUCONATE 4 % EX LIQD
60.0000 mL | Freq: Once | CUTANEOUS | Status: DC
  Filled 2014-04-20: qty 60

## 2014-04-20 MED ORDER — GENTAMICIN SULFATE 40 MG/ML IJ SOLN
80.0000 mg | INTRAMUSCULAR | Status: DC
  Filled 2014-04-20: qty 2

## 2014-04-21 ENCOUNTER — Ambulatory Visit (HOSPITAL_COMMUNITY)
Admission: RE | Admit: 2014-04-21 | Discharge: 2014-04-22 | Disposition: A | Discharge status: Home / Self Care | Payer: Medicare Other | Source: Ambulatory Visit | Attending: Internal Medicine | Admitting: Internal Medicine

## 2014-04-21 ENCOUNTER — Encounter (HOSPITAL_COMMUNITY)
Admission: RE | Disposition: A | Discharge status: Home / Self Care | Source: Ambulatory Visit | Attending: Internal Medicine

## 2014-04-21 ENCOUNTER — Encounter (HOSPITAL_COMMUNITY): Payer: Self-pay | Admitting: General Practice

## 2014-04-21 ENCOUNTER — Other Ambulatory Visit: Payer: Medicare Other

## 2014-04-21 DIAGNOSIS — F3289 Other specified depressive episodes: Secondary | ICD-10-CM | POA: Insufficient documentation

## 2014-04-21 DIAGNOSIS — R0602 Shortness of breath: Secondary | ICD-10-CM | POA: Insufficient documentation

## 2014-04-21 DIAGNOSIS — I259 Chronic ischemic heart disease, unspecified: Secondary | ICD-10-CM | POA: Insufficient documentation

## 2014-04-21 DIAGNOSIS — I517 Cardiomegaly: Secondary | ICD-10-CM | POA: Insufficient documentation

## 2014-04-21 DIAGNOSIS — I2789 Other specified pulmonary heart diseases: Secondary | ICD-10-CM | POA: Insufficient documentation

## 2014-04-21 DIAGNOSIS — I442 Atrioventricular block, complete: Secondary | ICD-10-CM

## 2014-04-21 DIAGNOSIS — I4891 Unspecified atrial fibrillation: Secondary | ICD-10-CM | POA: Insufficient documentation

## 2014-04-21 DIAGNOSIS — I129 Hypertensive chronic kidney disease with stage 1 through stage 4 chronic kidney disease, or unspecified chronic kidney disease: Secondary | ICD-10-CM | POA: Insufficient documentation

## 2014-04-21 DIAGNOSIS — I459 Conduction disorder, unspecified: Secondary | ICD-10-CM

## 2014-04-21 DIAGNOSIS — Z86718 Personal history of other venous thrombosis and embolism: Secondary | ICD-10-CM | POA: Insufficient documentation

## 2014-04-21 DIAGNOSIS — G4733 Obstructive sleep apnea (adult) (pediatric): Secondary | ICD-10-CM | POA: Insufficient documentation

## 2014-04-21 DIAGNOSIS — D638 Anemia in other chronic diseases classified elsewhere: Secondary | ICD-10-CM | POA: Insufficient documentation

## 2014-04-21 DIAGNOSIS — N039 Chronic nephritic syndrome with unspecified morphologic changes: Secondary | ICD-10-CM

## 2014-04-21 DIAGNOSIS — Z7901 Long term (current) use of anticoagulants: Secondary | ICD-10-CM | POA: Insufficient documentation

## 2014-04-21 DIAGNOSIS — E039 Hypothyroidism, unspecified: Secondary | ICD-10-CM | POA: Insufficient documentation

## 2014-04-21 DIAGNOSIS — I872 Venous insufficiency (chronic) (peripheral): Secondary | ICD-10-CM | POA: Insufficient documentation

## 2014-04-21 DIAGNOSIS — E119 Type 2 diabetes mellitus without complications: Secondary | ICD-10-CM | POA: Insufficient documentation

## 2014-04-21 DIAGNOSIS — N189 Chronic kidney disease, unspecified: Secondary | ICD-10-CM | POA: Insufficient documentation

## 2014-04-21 DIAGNOSIS — Z9861 Coronary angioplasty status: Secondary | ICD-10-CM | POA: Insufficient documentation

## 2014-04-21 DIAGNOSIS — D631 Anemia in chronic kidney disease: Secondary | ICD-10-CM | POA: Insufficient documentation

## 2014-04-21 DIAGNOSIS — E785 Hyperlipidemia, unspecified: Secondary | ICD-10-CM | POA: Insufficient documentation

## 2014-04-21 DIAGNOSIS — I2510 Atherosclerotic heart disease of native coronary artery without angina pectoris: Secondary | ICD-10-CM | POA: Insufficient documentation

## 2014-04-21 DIAGNOSIS — F329 Major depressive disorder, single episode, unspecified: Secondary | ICD-10-CM | POA: Insufficient documentation

## 2014-04-21 DIAGNOSIS — D469 Myelodysplastic syndrome, unspecified: Secondary | ICD-10-CM | POA: Insufficient documentation

## 2014-04-21 DIAGNOSIS — I498 Other specified cardiac arrhythmias: Secondary | ICD-10-CM | POA: Insufficient documentation

## 2014-04-21 DIAGNOSIS — R262 Difficulty in walking, not elsewhere classified: Secondary | ICD-10-CM | POA: Insufficient documentation

## 2014-04-21 DIAGNOSIS — H9190 Unspecified hearing loss, unspecified ear: Secondary | ICD-10-CM | POA: Insufficient documentation

## 2014-04-21 HISTORY — PX: INSERT / REPLACE / REMOVE PACEMAKER: SUR710

## 2014-04-21 HISTORY — PX: PERMANENT PACEMAKER INSERTION: SHX5480

## 2014-04-21 HISTORY — DX: Acute myocardial infarction, unspecified: I21.9

## 2014-04-21 HISTORY — DX: Heart failure, unspecified: I50.9

## 2014-04-21 HISTORY — DX: Pneumonia, unspecified organism: J18.9

## 2014-04-21 HISTORY — DX: Atrioventricular block, complete: I44.2

## 2014-04-21 HISTORY — DX: Obstructive sleep apnea (adult) (pediatric): G47.33

## 2014-04-21 HISTORY — DX: Spinal stenosis, lumbar region without neurogenic claudication: M48.061

## 2014-04-21 HISTORY — DX: Hypothyroidism, unspecified: E03.9

## 2014-04-21 HISTORY — DX: Gastro-esophageal reflux disease without esophagitis: K21.9

## 2014-04-21 HISTORY — DX: Acute embolism and thrombosis of unspecified deep veins of unspecified lower extremity: I82.409

## 2014-04-21 HISTORY — DX: Gout, unspecified: M10.9

## 2014-04-21 LAB — URINALYSIS, ROUTINE W REFLEX MICROSCOPIC
BILIRUBIN URINE: NEGATIVE
GLUCOSE, UA: NEGATIVE mg/dL
KETONES UR: NEGATIVE mg/dL
LEUKOCYTES UA: NEGATIVE
Nitrite: NEGATIVE
PH: 6.5 (ref 5.0–8.0)
Protein, ur: NEGATIVE mg/dL
Specific Gravity, Urine: 1.012 (ref 1.005–1.030)
Urobilinogen, UA: 0.2 mg/dL (ref 0.0–1.0)

## 2014-04-21 LAB — GLUCOSE, CAPILLARY
GLUCOSE-CAPILLARY: 110 mg/dL — AB (ref 70–99)
Glucose-Capillary: 90 mg/dL (ref 70–99)
Glucose-Capillary: 127 mg/dL — ABNORMAL HIGH (ref 70–99)
Glucose-Capillary: 125 mg/dL — ABNORMAL HIGH (ref 70–99)
Glucose-Capillary: 124 mg/dL — ABNORMAL HIGH (ref 70–99)

## 2014-04-21 LAB — URINE MICROSCOPIC-ADD ON

## 2014-04-21 LAB — SURGICAL PCR SCREEN
MRSA, PCR: NEGATIVE
Staphylococcus aureus: NEGATIVE

## 2014-04-21 LAB — PROTIME-INR
INR: 1.76 — ABNORMAL HIGH (ref 0.00–1.49)
Prothrombin Time: 20 seconds — ABNORMAL HIGH (ref 11.6–15.2)

## 2014-04-21 SURGERY — PERMANENT PACEMAKER INSERTION
Anesthesia: LOCAL

## 2014-04-21 MED ORDER — FERROUS SULFATE 325 (65 FE) MG PO TABS
325.0000 mg | ORAL_TABLET | Freq: Every day | ORAL | Status: DC
  Administered 2014-04-22: 325 mg via ORAL
  Filled 2014-04-21 (×2): qty 1

## 2014-04-21 MED ORDER — DOXAZOSIN MESYLATE 4 MG PO TABS
4.0000 mg | ORAL_TABLET | Freq: Every day | ORAL | Status: DC
  Filled 2014-04-21: qty 1

## 2014-04-21 MED ORDER — OXYBUTYNIN CHLORIDE 5 MG PO TABS
5.0000 mg | ORAL_TABLET | Freq: Four times a day (QID) | ORAL | Status: DC | PRN
  Filled 2014-04-21: qty 1

## 2014-04-21 MED ORDER — ONDANSETRON HCL 4 MG/2ML IJ SOLN: 4.0000 mg | Freq: Four times a day (QID) | INTRAMUSCULAR | Status: DC | PRN

## 2014-04-21 MED ORDER — ACETAMINOPHEN 325 MG PO TABS: 325.0000 mg | ORAL_TABLET | ORAL | Status: DC | PRN

## 2014-04-21 MED ORDER — WARFARIN - PHARMACIST DOSING INPATIENT: Freq: Every day | Status: DC

## 2014-04-21 MED ORDER — WARFARIN - PHYSICIAN DOSING INPATIENT: Freq: Every day | Status: DC

## 2014-04-21 MED ORDER — HEPARIN (PORCINE) IN NACL 2-0.9 UNIT/ML-% IJ SOLN
INTRAMUSCULAR | Status: AC
  Filled 2014-04-21: qty 500

## 2014-04-21 MED ORDER — VITAMIN B-12 1000 MCG PO TABS
1000.0000 ug | ORAL_TABLET | Freq: Every day | ORAL | Status: DC
  Administered 2014-04-22: 1000 ug via ORAL
  Filled 2014-04-21: qty 1

## 2014-04-21 MED ORDER — ACETAMINOPHEN 500 MG PO TABS
1000.0000 mg | ORAL_TABLET | Freq: Three times a day (TID) | ORAL | Status: DC | PRN
  Administered 2014-04-21: 1000 mg via ORAL
  Filled 2014-04-21: qty 2

## 2014-04-21 MED ORDER — TOBRAMYCIN 0.3 % OP SOLN
1.0000 [drp] | Freq: Two times a day (BID) | OPHTHALMIC | Status: DC
Start: 2014-04-21 — End: 2014-04-22
  Administered 2014-04-22: 1 [drp] via OPHTHALMIC
  Filled 2014-04-21: qty 5

## 2014-04-21 MED ORDER — FUROSEMIDE 80 MG PO TABS
80.0000 mg | ORAL_TABLET | Freq: Every day | ORAL | Status: DC
  Administered 2014-04-22: 80 mg via ORAL
  Filled 2014-04-21: qty 1

## 2014-04-21 MED ORDER — VITAMIN C 500 MG PO TABS
500.0000 mg | ORAL_TABLET | Freq: Every day | ORAL | Status: DC
  Administered 2014-04-22: 500 mg via ORAL
  Filled 2014-04-21: qty 1

## 2014-04-21 MED ORDER — MUPIROCIN 2 % EX OINT
TOPICAL_OINTMENT | Freq: Two times a day (BID) | CUTANEOUS | Status: DC
  Administered 2014-04-21: 1 via NASAL
  Filled 2014-04-21 (×2): qty 22

## 2014-04-21 MED ORDER — MIDAZOLAM HCL 5 MG/5ML IJ SOLN
INTRAMUSCULAR | Status: AC
  Filled 2014-04-21: qty 5

## 2014-04-21 MED ORDER — LEVOTHYROXINE SODIUM 150 MCG PO TABS
150.0000 ug | ORAL_TABLET | Freq: Every day | ORAL | Status: DC
  Administered 2014-04-22: 150 ug via ORAL
  Filled 2014-04-21 (×2): qty 1

## 2014-04-21 MED ORDER — LOSARTAN POTASSIUM 50 MG PO TABS
50.0000 mg | ORAL_TABLET | Freq: Every day | ORAL | Status: DC
  Administered 2014-04-22: 50 mg via ORAL
  Filled 2014-04-21: qty 1

## 2014-04-21 MED ORDER — WARFARIN SODIUM 2 MG PO TABS
2.0000 mg | ORAL_TABLET | Freq: Once | ORAL | Status: AC
  Administered 2014-04-21: 2 mg via ORAL
  Filled 2014-04-21: qty 1

## 2014-04-21 MED ORDER — VANCOMYCIN HCL IN DEXTROSE 1-5 GM/200ML-% IV SOLN
1000.0000 mg | Freq: Two times a day (BID) | INTRAVENOUS | Status: AC
  Administered 2014-04-21: 1000 mg via INTRAVENOUS
  Filled 2014-04-21 (×2): qty 200

## 2014-04-21 MED ORDER — GLIPIZIDE ER 2.5 MG PO TB24
2.5000 mg | ORAL_TABLET | Freq: Every day | ORAL | Status: DC
  Administered 2014-04-22: 2.5 mg via ORAL
  Filled 2014-04-21 (×2): qty 1

## 2014-04-21 MED ORDER — FENTANYL CITRATE 0.05 MG/ML IJ SOLN
INTRAMUSCULAR | Status: AC
  Filled 2014-04-21: qty 2

## 2014-04-21 MED ORDER — LIDOCAINE HCL (PF) 1 % IJ SOLN
INTRAMUSCULAR | Status: AC
  Filled 2014-04-21: qty 60

## 2014-04-21 NOTE — H&P (Signed)
HPI [name] is referred today by Truitt Merle for evaluation of symptomatic bradycardia due to atrial fibrillation with intermittant CHB. The patient is a pleasant 80 yo man with a h/o chronic atrial fibrillation, HTN, and chronic venous insufficiency resulting in peripheral edema, refractory to medical therapy. Because of dizziness, he has had a cardiac monitor which has demonstrated daytime pauses associate with pauses of over 3 seconds and heart rates in the 20's. He has intermittant CHB in atrial fib. The patient has never had frank syncope. He does have severe fatigue. He is on no reversible medications. Allergies   Allergen  Reactions   .  Keflex [Cephalexin]  Other (See Comments)   .  Codeine     .  Ramipril  Cough           Current Outpatient Prescriptions   Medication  Sig  Dispense  Refill   .  ACCU-CHEK AVIVA PLUS test strip  TEST ONCE EACH DAY AS DIRECTED   100 each   0   .  acetaminophen (TYLENOL) 500 MG tablet  Take 1,000 mg by mouth 2 (two) times daily.         .  Calcium Carb-Cholecalciferol (CALCIUM 1000 + D) 1000-800 MG-UNIT TABS  Take 1 tablet by mouth daily.          .  Cholecalciferol (VITAMIN D) 1000 UNITS capsule  Take 1,000 Units by mouth daily.          .  clotrimazole-betamethasone (LOTRISONE) cream           .  CRESTOR 10 MG tablet  TAKE ONE TABLET BY MOUTH DAILY   30 tablet   5   .  doxazosin (CARDURA) 8 MG tablet  TAKE 1/2 TABLET BY MOUTH TWICE DAILY   30 tablet   5   .  fish oil-omega-3 fatty acids 1000 MG capsule  Take 1 g by mouth daily.         .  furosemide (LASIX) 80 MG tablet  Taking 1 1/2 tablets daily         .  glipiZIDE (GLUCOTROL XL) 2.5 MG 24 hr tablet  TAKE 1 TABLET BY MOUTH EVERY DAY   30 tablet   5   .  Glucosamine-Chondroitin (OSTEO BI-FLEX REGULAR STRENGTH PO)  Take by mouth.         .  levothyroxine (SYNTHROID, LEVOTHROID) 150 MCG tablet  TAKE ONE TABLET BY MOUTH ONCE EVERY MORNING ON AN EMPTY STOMACH.   30 tablet   4   .  losartan  (COZAAR) 50 MG tablet  TAKE 1 TABLET BY MOUTH EVERY DAY   30 tablet   3   .  Magnesium 300 MG CAPS  Take 300 mg by mouth.         .  metolazone (ZAROXOLYN) 2.5 MG tablet  Take one tablet every other day 30 minutes before taking furosemide   15 tablet   0   .  oxybutynin (DITROPAN) 5 MG tablet  Take 5 mg by mouth as needed.           .  tobramycin (TOBREX) 0.3 % ophthalmic solution  Place 1 drop into the right eye every 6 (six) hours.   5 mL   0   .  vitamin B-12 (CYANOCOBALAMIN) 1000 MCG tablet  Take 1,000 mcg by mouth daily.         .  vitamin C (ASCORBIC ACID) 500 MG tablet  Take 500 mg by  mouth daily.         Marland Kitchen  warfarin (COUMADIN) 2.5 MG tablet  Take as directed by anticoagulation clinic   30 tablet   3       No current facility-administered medications for this visit.           Past Medical History   Diagnosis  Date   .  Edema     .  Hearing loss     .  SOB (shortness of breath)         WITH WALKING   .  Difficulty walking     .  Skin change     .  Chronic atrial fibrillation     .  Diabetes mellitus type II, controlled     .  Hyperlipidemia     .  Depression     .  Ischemic heart disease         remote stenting of the RCA in 1998 with residual LAD disease of 60 to 70% with negative nuclear in 2011   .  Enlarged RV (right ventricle)         per nuclear in 2011   .  Pulmonary hypertension         per echo in 2011   .  VHD (valvular heart disease)         per echo in 2011 with EF 50 to 55%, moderate AI, moderate MR, LAE, RAE, moderate TR and peak PA pressures up to 64mm   .  Arthritis     .  Diverticulitis     .  H/O blood clots  1990's       in L leg (related to being in a cast for surgery)   .  Sleep apnea     .  Unspecified deficiency anemia     .  Thrombocytopenia     .  Anal fissure     .  Anemia of chronic kidney failure     .  Neck mass  10/19/2013   .  Anemia in chronic kidney disease(285.21)  11/13/2013   .  MDS (myelodysplastic syndrome)  12/11/2013         ROS:    All systems reviewed and negative except as noted in the HPI.      Past Surgical History   Procedure  Laterality  Date   .  Thyroidectomy       .  Left wrist surgery       .  Achilles tendon repair           Left   .  Shrapnel           [place]    .  Tonsillectomy and adenoidectomy       .  Inguinal hernia repair       .  Cataract extraction       .  Vasectomy       .  Angioplasty       .  Colonoscopy    2009       polyps in the past           Family History   Problem  Relation  Age of Onset   .  Heart failure  Mother  26   .  Diabetes  Mother  68   .  Heart attack  Father  62   .  Stroke  Father  54   .  Aortic aneurysm  Father  59  ABDOMINAL   .  Cancer  Son         ?   Marland Kitchen  Hepatitis  Son     .  Diabetes  Brother             History       Social History   .  Marital Status:  Married       Spouse Name:  N/A       Number of Children:  6   .  Years of Education:  16+       Occupational History   .  Retired         Pharmacist, hospital       Social History Main Topics   .  Smoking status:  Former Smoker -- 2.00 packs/day for 1 years       Types:  Cigarettes       Quit date:  01/01/1952   .  Smokeless tobacco:  Never Used   .  Alcohol Use:  Yes         Comment: 1 glass wine daily   .  Drug Use:  No   .  Sexual Activity:  Not Currently       Other Topics  Concern   .  Not on file       Social History Narrative     No caffeine use     Regular exercise-no          Ht 5\' 6"  (1.676 m)  Wt 168 lb 6.4 oz (76.386 kg)  BMI 27.19 kg/m2   Physical Exam:   stable appearing elderly man, NAD HEENT: Unremarkable Neck:  No JVD, no thyromegally Back:  No CVA tenderness Lungs:  Clear with no wheezes HEART:  IRegular brady rhythm, no murmurs, no rubs, no clicks Abd:  soft, positive bowel sounds, no organomegally, no rebound, no guarding Ext:  2 plus pulses, no edema, no cyanosis, no clubbing Skin:  No rashes no nodules Neuro:   CN II through XII intact, motor grossly intact   EKG - atrial fib with a slow VR     Assess/Plan:         Chronic atrial fibrillation - Evans Lance, MD at 03/01/2014  5:58 PM    Status: Written Related Problem: Chronic atrial fibrillation    His ventricular rate is slow and he is on no AV nodal blocking drugs. I have discussed the treatment options with the patient and his wife and the risks/benefits/goals/expectations of PPM have been discussed with the patient and he wishes to proceed.           Ischemic heart disease - Evans Lance, MD at 03/01/2014  6:00 PM    Status: Written Related Problem: Ischemic heart disease    He denies anginal symptoms. He will be able to have his medications adjusted once his PPM is placed.          Douglas George.D.

## 2014-04-21 NOTE — Progress Notes (Signed)
Pt found to have blood from tip of penis after urination. Brooke, [place] notified. Will obtain UA next time patient urinates. OK to give coumadin tonight. Dorna Bloom, RN

## 2014-04-21 NOTE — Progress Notes (Signed)
UR Completed Earlee Herald Graves-Bigelow, RN,BSN [phone]  

## 2014-04-21 NOTE — Progress Notes (Signed)
ANTICOAGULATION CONSULT NOTE - Initial Consult  Pharmacy Consult for Warfarin Indication: Hx Afib  Allergies  Allergen Reactions  . Keflex [Cephalexin] Other (See Comments)  . Codeine   . Ramipril Cough    Patient Measurements: Height: 5\' 6"  (167.6 cm) IBW/kg (Calculated) : 63.8  Vital Signs: Temp: 98 F (36.7 C) (04/22 1040) Temp src: Oral (04/22 0559) BP: 117/49 mmHg (04/22 1305) Pulse Rate: 60 (04/22 1040)  Labs:  Recent Labs  04/21/14 0621  LABPROT 20.0*  INR 1.76*    The CrCl is unknown because both a height and weight (above a minimum accepted value) are required for this calculation.   Medical History: Past Medical History  Diagnosis Date  . Edema   . Hearing loss   . SOB (shortness of breath)     WITH WALKING  . Difficulty walking   . Skin change   . Chronic atrial fibrillation   . Diabetes mellitus type II, controlled   . Hyperlipidemia   . Depression   . Ischemic heart disease     remote stenting of the RCA in 1998 with residual LAD disease of 60 to 70% with negative nuclear in 2011  . Enlarged RV (right ventricle)     per nuclear in 2011  . Pulmonary hypertension     per echo in 2011  . VHD (valvular heart disease)     per echo in 2011 with EF 50 to 55%, moderate AI, moderate MR, LAE, RAE, moderate TR and peak PA pressures up to 44mm  . Diverticulitis   . H/O blood clots 1990's    in L leg (related to being in a cast for surgery)  . Thrombocytopenia   . Anal fissure   . Neck mass 10/19/2013  . MDS (myelodysplastic syndrome) 12/11/2013  . Heart murmur   . CHF (congestive heart failure)   . Myocardial infarction 1999  . DVT (deep venous thrombosis) ~ 2009    "LLE"  . Pneumonia ~ 11/2013    "tx'd; not really sure if he had it or not"  . OSA (obstructive sleep apnea)     BiPAP  . Hypothyroidism   . Unspecified deficiency anemia   . Anemia of chronic kidney failure   . Anemia in chronic kidney disease(285.21) 11/13/2013  . GERD  (gastroesophageal reflux disease)   . Arthritis     "joints" (04/21/2014)  . Spinal stenosis, lumbar   . Gout     "little bit in the left foot"    Assessment: 80 y.o. M admitted for pacemaker placement for symptomatic bradycardia in the setting of atrial fibrillation with intermittent CHB. The patient was on warfarin PTA for anticoagulation and pharmacy has been asked to resume this. The physician has already ordered for a dose of 2 mg this evening (which has already been administered). Will order PT/INR and plan for pharmacy to start warfarin dosing on 4/23. Noted RN report of some blood with urination - will watch closely. INR 1.76 today.  Goal of Therapy:  INR 1.5-2 (per Dr. Lamonte Sakai on 03/09/13) Monitor platelets by anticoagulation protocol: Yes   Plan:  1. Continue Warfarin 2 mg x 1 dose (as ordered by MD) 2. Daily PT/INR 3. Will continue to monitor for any signs/symptoms of bleeding and will follow up with PT/INR in the a.m.   Alycia Rossetti, PharmD, BCPS Clinical Pharmacist Pager: [phone] 04/21/2014 7:36 PM

## 2014-04-21 NOTE — CV Procedure (Signed)
Electrophysiology procedure note  Procedure: Insertion of a single chamber pacemaker  Indication: Symptomatic bradycardia secondary to intermittent complete heart block in the setting of chronic atrial fibrillation with no reversible causes  Description of the procedure: After informed consent was obtained, the patient was taken to the diagnostic electrophysiology laboratory in the fasting state. After the usual preparation and draping, intravenous Versed and fentanyl were given for sedation. 30 cc of lidocaine was infiltrated into the left infraclavicular region. A 5 cm incision was carried out and electrocautery was utilized to dissect down to the fascial plane. The left subclavian vein was punctured, and the St. Jude active fixation bipolar pacing lead, serial number [id number] was advanced into the right ventricle. Mapping was carried out. The lead was placed on the right ventricular apical septum. R waves measured 9.3 mV. The pacing impedance was 490 ohms. The pacing threshold was 0.5 V at 0.4 ms. The lead was secured to the fascial plane with silk suture. The sewing sleeve was secured with silk suture. Electrocautery was utilized to make a subcutaneous pocket. Antibiotic irrigation was utilized to irrigate the pocket. Electrocautery was utilized to assure hemostasis. The St. Jude single chamber pacemaker, serial number [id number] was connected to the ventricular pacing lead in place back in the subcutaneous pocket. The pocket was irrigated with antibiotic irrigation, and the incision was closed with 2 layers of Vicryl suture. Benzoin and Steri-Strips her pain on the skin, and the patient was returned to his room in satisfactory condition.  Complications: There were no immediate procedure complications  Conclusion: Successful insertion of a St. Jude single chamber pacemaker in a patient with chronic atrial fibrillation, symptomatic bradycardia, due to transient complete heart block.  Cristopher Peru,  M.D.

## 2014-04-22 ENCOUNTER — Encounter (HOSPITAL_COMMUNITY): Payer: Self-pay | Admitting: *Deleted

## 2014-04-22 ENCOUNTER — Ambulatory Visit (HOSPITAL_COMMUNITY): Payer: Medicare Other

## 2014-04-22 DIAGNOSIS — I459 Conduction disorder, unspecified: Secondary | ICD-10-CM

## 2014-04-22 LAB — PROTIME-INR
INR: 1.78 — AB (ref 0.00–1.49)
PROTHROMBIN TIME: 20.2 s — AB (ref 11.6–15.2)

## 2014-04-22 LAB — GLUCOSE, CAPILLARY: Glucose-Capillary: 91 mg/dL (ref 70–99)

## 2014-04-22 MED ORDER — WARFARIN SODIUM 2.5 MG PO TABS
2.5000 mg | ORAL_TABLET | Freq: Once | ORAL | Status: DC
  Filled 2014-04-22: qty 1

## 2014-04-22 NOTE — Discharge Summary (Signed)
ELECTROPHYSIOLOGY DISCHARGE SUMMARY   Patient ID: Douglas George,  MRN: [id number], DOB/AGE: [birth year] 80 y.o.  Admit date: 04/21/2014 Discharge date: 04/22/2014  Primary Care Physician: Cathlean Cower, MD Primary Cardiologist: previously Doreatha Lew, MD; now [place], MD Primary EP: Cristopher Peru, MD  Primary Discharge Diagnosis:  1. Symptomatic bradycardia with intermittent complete heart block s/p single chamber PPM implantation 2. Permanent atrial fibrillation  Secondary Discharge Diagnoses:  1. CAD 2. Myelodysplastic syndrome 3. Anemia 4. Pulmonary HTN 5. OSA 6. Hypothyroidism  Procedures This Admission:  1. Single chamber PPM implantation RV lead - St. Jude active fixation bipolar pacing lead, serial number [id number] Device - St. Jude single chamber pacemaker, serial number [id number]   History and Hospital Course:  Douglas George is an 80 year old man with CAD, permanent atrial fibrillation, myelodysplastic syndrome, anemia of chronic disease, pulmonary HTN, OSA and hypothyroidism who was recently found to have intermittent complete heart block while wearing an event monitor for evaluation of dizziness. Therefore he underwent single chamber PPM implantation on 04/21/2014. Douglas George tolerated this procedure well without any immediate complication. He remains hemodynamically stable and afebrile. His chest xray shows stable lead placement without pneumothorax. His device interrogation shows normal PPM function with stable lead parameters/measurements. His implant site is intact without significant bleeding or hematoma. He has been given discharge instructions including wound care and activity restrictions. He will follow-up in 10 days for wound check. There were no changes made to his medications. He has been seen, examined and deemed stable for discharge today by Dr. Thompson Grayer.  Discharge Vitals: Blood pressure 152/65, pulse 60, temperature 98.6 F (37 C), temperature source Oral, resp.  rate 18, height 5\' 6"  (1.676 m), SpO2 94.00%.   Labs: Lab Results  Component Value Date   WBC 5.8 04/14/2014   HGB 10.9* 04/14/2014   HCT 32.9* 04/14/2014   MCV 97.9 04/14/2014   PLT 116.0* 04/14/2014    Recent Labs  04/22/14 0510  INR 1.78*    Disposition:  The patient is being discharged in stable condition.  Follow-up: Follow-up Information   Follow up with Truitt Merle, NP On 05/03/2014. (At 3:00 PM for routine follow-up)    Specialty:  Nurse Practitioner   Contact information:   [place] [place] [address] [phone]       [facility] with [facility] On 05/03/2014. (At 4:00 PM for wound check)    Specialty:  Cardiology   Contact information:   [address], [place] [address] [phone]      Follow up with [facility] On 04/26/2014. (At 12:45 PM for Coumadin follow-up)    Specialty:  Cardiology   Contact information:   [address] [place] [address] [phone]      Follow up with Cristopher Peru, MD On 07/22/2014. (At 11:15 AM)    Specialty:  Cardiology   Contact information:   [address] [address] [phone]      Discharge Medications:    Medication List         ACCU-CHEK AVIVA PLUS test strip  Generic drug:  glucose blood  USE TO TEST ONCE DAILY AS DIRECTED     acetaminophen 500 MG tablet  Commonly known as:  TYLENOL  Take 1,000 mg by mouth every 8 (eight) hours as needed for moderate pain or headache.     CALCIUM 1200 PO  Take  by mouth.     doxazosin 8 MG tablet  Commonly known as:  CARDURA  Take 4 mg by mouth daily.     ferrous sulfate 325 (65 FE) MG tablet  Take 325 mg by mouth daily with breakfast.     fish oil-omega-3 fatty acids 1000 MG capsule  Take 1 g by mouth daily.     furosemide 80 MG tablet  Commonly known as:  LASIX  Take 80 mg by mouth daily.     glipiZIDE 2.5 MG 24 hr tablet  Commonly  known as:  GLUCOTROL XL  TAKE 1 TABLET BY MOUTH EVERY DAY     levothyroxine 150 MCG tablet  Commonly known as:  SYNTHROID, LEVOTHROID  Take 150 mcg by mouth daily before breakfast.     losartan 50 MG tablet  Commonly known as:  COZAAR  TAKE 1 TABLET BY MOUTH EVERY DAY     Magnesium 300 MG Caps  Take 300 mg by mouth.     OSTEO BI-FLEX JOINT SHIELD PO  Take 2 tablets by mouth daily. 1 tab in the morning and 1 tab in the evening     oxybutynin 5 MG tablet  Commonly known as:  DITROPAN  Take 5 mg by mouth every 6 (six) hours as needed for bladder spasms.     tobramycin 0.3 % ophthalmic solution  Commonly known as:  TOBREX  Place 1 drop into both eyes 2 (two) times daily.     vitamin B-12 1000 MCG tablet  Commonly known as:  CYANOCOBALAMIN  Take 1,000 mcg by mouth daily.     vitamin C 500 MG tablet  Commonly known as:  ASCORBIC ACID  Take 500 mg by mouth daily.     Vitamin D 1000 UNITS capsule  Take 1,000 Units by mouth daily.     warfarin 2.5 MG tablet  Commonly known as:  COUMADIN  Take 1.25-2.5 mg by mouth daily. 2.5mg /day except 1.25mg  on Wednesday       Duration of Discharge Encounter: Greater than 30 minutes including physician time.  Darrick Huntsman, PA-C 04/22/2014, 12:37 PM  Thompson Grayer MD

## 2014-04-22 NOTE — Discharge Instructions (Signed)
° °  Supplemental Discharge Instructions for  Pacemaker/Defibrillator Patients  Activity No heavy lifting or vigorous activity with your left/right arm for 6 to 8 weeks.  Do not raise your left/right arm above your head for one week.  Gradually raise your affected arm as drawn below.           04/26                       04/27                      04/28                      04/29       NO DRIVING for 1 week; you may begin driving on 10/24/8526. WOUND CARE   Keep the wound area clean and dry.  Do not get this area wet for one week. No showers for one week; you may shower on 04/30/2014.   The tape/steri-strips on your wound will fall off; do not pull them off.  No bandage is needed on the site.  DO  NOT apply any creams, oils, or ointments to the wound area.   If you notice any drainage or discharge from the wound, any swelling or bruising at the site, or you develop a fever > 101? F after you are discharged home, call the office at once.  Special Instructions   You are still able to use cellular telephones; use the ear opposite the side where you have your pacemaker/defibrillator.  Avoid carrying your cellular phone near your device.   When traveling through airports, show security personnel your identification card to avoid being screened in the metal detectors.  Ask the security personnel to use the hand wand.   Avoid arc welding equipment, MRI testing (magnetic resonance imaging), TENS units (transcutaneous nerve stimulators).  Call the office for questions about other devices.   Avoid electrical appliances that are in poor condition or are not properly grounded.   Microwave ovens are safe to be near or to operate.

## 2014-04-22 NOTE — Progress Notes (Signed)
ANTICOAGULATION CONSULT NOTE - Follow Up Consult  Pharmacy Consult for coumadin Indication: atrial fibrillation  Allergies  Allergen Reactions  . Keflex [Cephalexin] Other (See Comments)  . Codeine   . Ramipril Cough    Patient Measurements: Height: 5\' 6"  (167.6 cm) IBW/kg (Calculated) : 63.8   Vital Signs: Temp: 98.6 F (37 C) (04/23 0500) Temp src: Oral (04/23 0500) BP: 124/59 mmHg (04/23 0500) Pulse Rate: 60 (04/23 0500)  Labs:  Recent Labs  04/21/14 0621 04/22/14 0510  LABPROT 20.0* 20.2*  INR 1.76* 1.78*    The CrCl is unknown because both a height and weight (above a minimum accepted value) are required for this calculation.  Assessment: Patient is an 80 y.o M on coumadin PTA for afib.  S/p pacemaker insertion on 4/22 with coumadin resumed post procedure.  INR is therapeutic at 1.78 this morning.    Goal of Therapy:  INR 1.5-2  (per Dr. Lamonte Sakai and anti-coag clinic)    Plan:  1) coumadin 2.5 mg PO x1 today  Veneta Sliter P Manual Navarra 04/22/2014,8:43 AM

## 2014-04-22 NOTE — Progress Notes (Signed)
Patient: Douglas George Date of Encounter: 04/22/2014, 9:52 AM Admit date: 04/21/2014     Subjective  [name] has no complaints this AM. He denies CP or SOB.   Objective  Physical Exam: Vitals: BP 124/59  Pulse 60  Temp(Src) 98.6 F (37 C) (Oral)  Resp 18  Ht 5\' 6"  (1.676 m)  SpO2 94% General: Well developed, elderly appearing 80 year old male in no acute distress. Neck: Supple. JVD not elevated. Lungs: Diminished breath sounds throughout but clear bilaterally to auscultation without wheezes, rales, or rhonchi. Breathing is unlabored. Heart: Regular S1 S2 without murmurs, rubs, or gallops.  Abdomen: Soft, non-distended. Extremities: No clubbing or cyanosis. No edema.  Distal pedal pulses are 2+ and equal bilaterally. Neuro: Alert and oriented X 3. Moves all extremities spontaneously. No focal deficits. Skin: Left upper chest / implant site intact without bleeding or hematoma.  Intake/Output:  Intake/Output Summary (Last 24 hours) at 04/22/14 0952 Last data filed at 04/21/14 1813  Gross per 24 hour  Intake      0 ml  Output    600 ml  Net   -600 ml    Inpatient Medications:  . doxazosin  4 mg Oral Daily  . ferrous sulfate  325 mg Oral Q breakfast  . furosemide  80 mg Oral Daily  . glipiZIDE  2.5 mg Oral Q breakfast  . levothyroxine  150 mcg Oral QAC breakfast  . losartan  50 mg Oral Daily  . tobramycin  1 drop Both Eyes BID  . vitamin B-12  1,000 mcg Oral Daily  . vitamin C  500 mg Oral Daily  . warfarin  2.5 mg Oral ONCE-1800  . Warfarin - Pharmacist Dosing Inpatient   Does not apply q1800    Labs:    Component Value Date/Time   WBC 5.8 04/14/2014 1348   WBC 6.0 03/30/2014 1249   RBC 3.36* 04/14/2014 1348   RBC 3.16* 03/30/2014 1249   HGB 10.9* 04/14/2014 1348   HGB 10.1* 03/30/2014 1249   HCT 32.9* 04/14/2014 1348   HCT 31.1* 03/30/2014 1249   PLT 116.0* 04/14/2014 1348   PLT 123* 03/30/2014 1249   MCV 97.9 04/14/2014 1348   MCV 98.2* 03/30/2014 1249   MCH  31.9 03/30/2014 1249   MCH 33.5 10/30/2013 1041   MCHC 33.0 04/14/2014 1348   MCHC 32.4 03/30/2014 1249   RDW 18.0* 04/14/2014 1348   RDW 17.0* 03/30/2014 1249   LYMPHSABS 1.2 04/14/2014 1348   LYMPHSABS 0.8* 03/30/2014 1249   MONOABS 0.6 04/14/2014 1348   MONOABS 0.9 03/30/2014 1249   EOSABS 0.2 04/14/2014 1348   EOSABS 0.2 03/30/2014 1249   BASOSABS 0.0 04/14/2014 1348   BASOSABS 0.1 03/30/2014 1249      Component Value Date/Time   NA 135 04/14/2014 1348   NA 137 03/30/2014 1248   K 4.6 04/14/2014 1348   K 4.5 03/30/2014 1248   CL 101 04/14/2014 1348   CL 104 06/19/2013 1517   CO2 26 04/14/2014 1348   CO2 21* 03/30/2014 1248   GLUCOSE 127* 04/14/2014 1348   GLUCOSE 91 03/30/2014 1248   GLUCOSE 90 06/19/2013 1517   BUN 69* 04/14/2014 1348   BUN 72.6* 03/30/2014 1248   CREATININE 2.3* 04/14/2014 1348   CREATININE 2.0* 03/30/2014 1248   CALCIUM 9.0 04/14/2014 1348   CALCIUM 8.8 03/30/2014 1248    Recent Labs  04/22/14 0510  INR 1.78*    Radiology/Studies: Dg Chest 2 View  04/22/2014   CLINICAL DATA:  Status post pacemaker placement  EXAM: CHEST  2 VIEW  COMPARISON:  11/18/2013  FINDINGS: A single lead pacemaker is now seen. Cardiomegaly is again identified. Chronic changes are noted in the right lung base. Mild vascular congestion is noted. Degenerative changes of both shoulder joints are again seen. No pneumothorax is noted.  IMPRESSION: No pneumothorax following pacemaker placement.   Electronically Signed   By: Inez Catalina M.D.   On: 04/22/2014 08:14   Telemetry: V paced with underlying AFib   Assessment and Plan  1. Symptomatic bradycardia with intermittent CHB s/p single chamber PPM implant yesterday 2. Permanent atrial fibrillation Douglas George is doing well post PPM. Wound is intact without bleeding or hematoma. Device interrogation shows normal PPM function. Chest x-ray shows stable lead placement without pneumothorax. Reviewed DC instructions and follow-up. Plan for discharge home  today.  Signed, Azzie Roup Edmisten PA-C  I have seen, examined the patient, and reviewed the above assessment and plan.  Changes to above are made where necessary.  Doing well s/p PPM.  Device interrogation and cxr are reviewed DC to home with routine wound care and follow-up  Co Sign: Thompson Grayer, MD 04/22/2014 10:43 AM

## 2014-04-22 NOTE — Progress Notes (Signed)
Pt discharged to home per MD order. Pt and wife received and reviewed all discharge instructions and medication information including follow-up appointments and prescription information. Pt and wife verbalized understanding. Pt IV and telemetry box removed prior to discharge. Pt alert and oriented at discharge with no complaints of pain. Pt escorted to private vehicle via wheelchair by guest services volunteer. Lenna Sciara

## 2014-04-26 ENCOUNTER — Other Ambulatory Visit: Payer: Self-pay

## 2014-04-26 LAB — PROTIME-INR
INR: 1.7
Prothrombin Time: 20 secs — ABNORMAL HIGH (ref 11.5–14.7)

## 2014-04-26 LAB — POCT INR: INR: 1.7

## 2014-04-27 ENCOUNTER — Ambulatory Visit (INDEPENDENT_AMBULATORY_CARE_PROVIDER_SITE_OTHER): Payer: Medicare Other | Admitting: General Practice

## 2014-04-27 DIAGNOSIS — Z5181 Encounter for therapeutic drug level monitoring: Secondary | ICD-10-CM

## 2014-04-27 NOTE — Progress Notes (Signed)
Pre visit review using our clinic review tool, if applicable. No additional management support is needed unless otherwise documented below in the visit note. 

## 2014-04-28 ENCOUNTER — Telehealth: Payer: Self-pay | Admitting: Hematology and Oncology

## 2014-04-28 ENCOUNTER — Ambulatory Visit (HOSPITAL_BASED_OUTPATIENT_CLINIC_OR_DEPARTMENT_OTHER): Admitting: Hematology and Oncology

## 2014-04-28 ENCOUNTER — Other Ambulatory Visit (HOSPITAL_BASED_OUTPATIENT_CLINIC_OR_DEPARTMENT_OTHER)

## 2014-04-28 ENCOUNTER — Ambulatory Visit (HOSPITAL_BASED_OUTPATIENT_CLINIC_OR_DEPARTMENT_OTHER)

## 2014-04-28 VITALS — BP 118/52 | HR 60 | Temp 98.2°F | Resp 18 | Ht 66.0 in | Wt 162.4 lb

## 2014-04-28 DIAGNOSIS — R0989 Other specified symptoms and signs involving the circulatory and respiratory systems: Secondary | ICD-10-CM

## 2014-04-28 DIAGNOSIS — D649 Anemia, unspecified: Secondary | ICD-10-CM

## 2014-04-28 DIAGNOSIS — N183 Chronic kidney disease, stage 3 unspecified: Secondary | ICD-10-CM

## 2014-04-28 DIAGNOSIS — R0609 Other forms of dyspnea: Secondary | ICD-10-CM

## 2014-04-28 DIAGNOSIS — D630 Anemia in neoplastic disease: Secondary | ICD-10-CM

## 2014-04-28 DIAGNOSIS — N189 Chronic kidney disease, unspecified: Secondary | ICD-10-CM

## 2014-04-28 DIAGNOSIS — D469 Myelodysplastic syndrome, unspecified: Secondary | ICD-10-CM

## 2014-04-28 DIAGNOSIS — I428 Other cardiomyopathies: Secondary | ICD-10-CM

## 2014-04-28 DIAGNOSIS — D631 Anemia in chronic kidney disease: Secondary | ICD-10-CM

## 2014-04-28 DIAGNOSIS — D696 Thrombocytopenia, unspecified: Secondary | ICD-10-CM

## 2014-04-28 DIAGNOSIS — N039 Chronic nephritic syndrome with unspecified morphologic changes: Secondary | ICD-10-CM

## 2014-04-28 DIAGNOSIS — I509 Heart failure, unspecified: Secondary | ICD-10-CM

## 2014-04-28 DIAGNOSIS — R609 Edema, unspecified: Secondary | ICD-10-CM

## 2014-04-28 LAB — BASIC METABOLIC PANEL (CC13)
Anion Gap: 14 mEq/L — ABNORMAL HIGH (ref 3–11)
BUN: 70.5 mg/dL — ABNORMAL HIGH (ref 7.0–26.0)
CHLORIDE: 107 meq/L (ref 98–109)
CO2: 21 meq/L — AB (ref 22–29)
CREATININE: 2 mg/dL — AB (ref 0.7–1.3)
Calcium: 9.1 mg/dL (ref 8.4–10.4)
GLUCOSE: 146 mg/dL — AB (ref 70–140)
Potassium: 4.4 mEq/L (ref 3.5–5.1)
Sodium: 141 mEq/L (ref 136–145)

## 2014-04-28 LAB — CBC WITH DIFFERENTIAL/PLATELET
BASO%: 0.8 % (ref 0.0–2.0)
Basophils Absolute: 0 10*3/uL (ref 0.0–0.1)
EOS ABS: 0.3 10*3/uL (ref 0.0–0.5)
EOS%: 6.1 % (ref 0.0–7.0)
HCT: 29.4 % — ABNORMAL LOW (ref 38.4–49.9)
HGB: 9.6 g/dL — ABNORMAL LOW (ref 13.0–17.1)
LYMPH%: 11.7 % — AB (ref 14.0–49.0)
MCH: 32.4 pg (ref 27.2–33.4)
MCHC: 32.5 g/dL (ref 32.0–36.0)
MCV: 99.6 fL — AB (ref 79.3–98.0)
MONO#: 0.7 10*3/uL (ref 0.1–0.9)
MONO%: 13 % (ref 0.0–14.0)
NEUT%: 68.4 % (ref 39.0–75.0)
NEUTROS ABS: 3.5 10*3/uL (ref 1.5–6.5)
PLATELETS: 128 10*3/uL — AB (ref 140–400)
RBC: 2.96 10*6/uL — AB (ref 4.20–5.82)
RDW: 16.4 % — ABNORMAL HIGH (ref 11.0–14.6)
WBC: 5.1 10*3/uL (ref 4.0–10.3)
lymph#: 0.6 10*3/uL — ABNORMAL LOW (ref 0.9–3.3)

## 2014-04-28 LAB — HOLD TUBE, BLOOD BANK

## 2014-04-28 MED ORDER — DARBEPOETIN ALFA-POLYSORBATE 300 MCG/0.6ML IJ SOLN
300.0000 ug | Freq: Once | INTRAMUSCULAR | Status: AC
  Administered 2014-04-28: 300 ug via SUBCUTANEOUS
  Filled 2014-04-28: qty 0.6

## 2014-04-28 NOTE — Telephone Encounter (Signed)
gv and printed appts ched and avs for pt for May thru OCT

## 2014-04-29 NOTE — Progress Notes (Signed)
[facility] OFFICE PROGRESS NOTE  Cathlean Cower, MD DIAGNOSIS:  Myelodysplastic syndrome, ongoing treatment with erythropoietin stimulating agents, with symptoms of anemia and thrombocytopenia  SUMMARY OF HEMATOLOGIC HISTORY: This is a patient who has been followed here for many years. He had history of chronic anemia for some time and had a bone marrow aspirate and biopsy done in 2007 that was unremarkable. Due to chronic anemia and chronic kidney disease he was referred to the [facility] to receive erythropoietin stimulating agents. Due to anemia, he had received one dose of intravenous iron infusion in January 2014 and darbepoetin injection since June of 2014. On 10/30/2013, he had bone marrow aspirate and biopsy showed dyspoietic changes suggested for early myelodysplastic syndrome. We resume erythropoietin stimulating agents every other week to keep hemoglobin above 10 g. the frequency of erythropoietin stimulating agents is subsequently changed to every 4 weeks. In April 2015, the patient had permanent pacemaker implantation for irregular heartbeat and congestive heart failure. INTERVAL HISTORY: Douglas George 80 y.o. male returns for further followup. He has extensive bruising on his chest wall from recent pacemaker implantation. He denies any chest pain or shortness of breath. His weight is stable. The shortness of breath on exertion is improving. Bilateral lower extremity edema is stable.  I have reviewed the past medical history, past surgical history, social history and family history with the patient and they are unchanged from previous note.  ALLERGIES:  is allergic to keflex; codeine; and ramipril.  MEDICATIONS:  Current Outpatient Prescriptions  Medication Sig Dispense Refill  . ACCU-CHEK AVIVA PLUS test strip USE TO TEST ONCE DAILY AS DIRECTED  50 each  5  . acetaminophen (TYLENOL) 500 MG tablet Take 1,000 mg by mouth every 8 (eight) hours as needed for  moderate pain or headache.       . Calcium Carbonate-Vit D-Min (CALCIUM 1200 PO) Take by mouth.      . Cholecalciferol (VITAMIN D) 1000 UNITS capsule Take 1,000 Units by mouth daily.       Marland Kitchen doxazosin (CARDURA) 8 MG tablet Take 4 mg by mouth daily.      . fish oil-omega-3 fatty acids 1000 MG capsule Take 1 g by mouth daily.      . furosemide (LASIX) 80 MG tablet Take 80 mg by mouth daily.      Marland Kitchen glipiZIDE (GLUCOTROL XL) 2.5 MG 24 hr tablet TAKE 1 TABLET BY MOUTH EVERY DAY  30 tablet  5  . levothyroxine (SYNTHROID, LEVOTHROID) 150 MCG tablet Take 150 mcg by mouth daily before breakfast.      . losartan (COZAAR) 50 MG tablet TAKE 1 TABLET BY MOUTH EVERY DAY  30 tablet  0  . Magnesium 300 MG CAPS Take 300 mg by mouth.      . Misc Natural Products (OSTEO BI-FLEX JOINT SHIELD PO) Take 2 tablets by mouth daily. 1 tab in the morning and 1 tab in the evening      . oxybutynin (DITROPAN) 5 MG tablet Take 5 mg by mouth every 6 (six) hours as needed for bladder spasms.       Marland Kitchen tobramycin (TOBREX) 0.3 % ophthalmic solution Place 1 drop into both eyes 2 (two) times daily.      . vitamin B-12 (CYANOCOBALAMIN) 1000 MCG tablet Take 1,000 mcg by mouth daily.      . vitamin C (ASCORBIC ACID) 500 MG tablet Take 500 mg by mouth daily.      Marland Kitchen warfarin (COUMADIN) 2.5 MG  tablet Take 1.25-2.5 mg by mouth daily. 2.5mg /day except 1.25mg  on Wednesday       No current facility-administered medications for this visit.     REVIEW OF SYSTEMS:   Constitutional: Denies fevers, chills or night sweats Eyes: Denies blurriness of vision Ears, nose, mouth, throat, and face: Denies mucositis or sore throat Respiratory: Denies cough or wheezes Gastrointestinal:  Denies nausea, heartburn or change in bowel habits Skin: Denies abnormal skin rashes Lymphatics: Denies new lymphadenopathy Neurological:Denies numbness, tingling or new weaknesses Behavioral/Psych: Mood is stable, no new changes  All other systems were reviewed with  the patient and are negative.  PHYSICAL EXAMINATION: ECOG PERFORMANCE STATUS: 2 - Symptomatic, <50% confined to bed  Filed Vitals:   04/28/14 1024  BP: 118/52  Pulse:   Temp:   Resp:    Filed Weights   04/28/14 1015  Weight: 162 lb 6.4 oz (73.664 kg)    GENERAL:alert, no distress and comfortable. He is elderly SKIN: skin color, texture, turgor are normal, no rashes or significant lesions. Extensive bruises are noted on his chest wall EYES: normal, Conjunctiva are pink and non-injected, sclera clear OROPHARYNX:no exudate, no erythema and lips, buccal mucosa, and tongue normal  NECK: supple, thyroid normal size, non-tender, without nodularity LYMPH:  no palpable lymphadenopathy in the cervical, axillary or inguinal LUNGS: Reduced breath on bilateral lower base of lungs, clear to auscultation otherwise with normal breathing effort HEART: regular rate & rhythm and no murmurs with moderate lower extremity edema ABDOMEN:abdomen soft, non-tender and normal bowel sounds Musculoskeletal:no cyanosis of digits and no clubbing  NEURO: alert & oriented x 3 with fluent speech, no focal motor/sensory deficits  LABORATORY DATA:  I have reviewed the data as listed Results for orders placed in visit on 04/28/14 (from the past 48 hour(s))  CBC WITH DIFFERENTIAL     Status: Abnormal   Collection Time    04/28/14  9:51 AM      Result Value Ref Range   WBC 5.1  4.0 - 10.3 10e3/uL   NEUT# 3.5  1.5 - 6.5 10e3/uL   HGB 9.6 (*) 13.0 - 17.1 g/dL   HCT 29.4 (*) 38.4 - 49.9 %   Platelets 128 (*) 140 - 400 10e3/uL   MCV 99.6 (*) 79.3 - 98.0 fL   MCH 32.4  27.2 - 33.4 pg   MCHC 32.5  32.0 - 36.0 g/dL   RBC 2.96 (*) 4.20 - 5.82 10e6/uL   RDW 16.4 (*) 11.0 - 14.6 %   lymph# 0.6 (*) 0.9 - 3.3 10e3/uL   MONO# 0.7  0.1 - 0.9 10e3/uL   Eosinophils Absolute 0.3  0.0 - 0.5 10e3/uL   Basophils Absolute 0.0  0.0 - 0.1 10e3/uL   NEUT% 68.4  39.0 - 75.0 %   LYMPH% 11.7 (*) 14.0 - 49.0 %   MONO% 13.0  0.0 -  14.0 %   EOS% 6.1  0.0 - 7.0 %   BASO% 0.8  0.0 - 2.0 %  HOLD TUBE, BLOOD BANK     Status: None   Collection Time    04/28/14  9:51 AM      Result Value Ref Range   Hold Tube, Blood Bank Blood Bank Order Cancelled    BASIC METABOLIC PANEL (TK16)     Status: Abnormal   Collection Time    04/28/14  9:51 AM      Result Value Ref Range   Sodium 141  136 - 145 mEq/L   Potassium 4.4  3.5 - 5.1 mEq/L   Chloride 107  98 - 109 mEq/L   CO2 21 (*) 22 - 29 mEq/L   Glucose 146 (*) 70 - 140 mg/dl   BUN 70.5 (*) 7.0 - 26.0 mg/dL   Creatinine 2.0 (*) 0.7 - 1.3 mg/dL   Calcium 9.1  8.4 - 10.4 mg/dL   Anion Gap 14 (*) 3 - 11 mEq/L    Lab Results  Component Value Date   WBC 5.1 04/28/2014   HGB 9.6* 04/28/2014   HCT 29.4* 04/28/2014   MCV 99.6* 04/28/2014   PLT 128* 04/28/2014   ASSESSMENT & PLAN:  #1 myelodysplastic syndrome His bone marrow aspirate and biopsy was abnormal. At present time, his calculated IPS test score is low. Survival is still measured in years. I recommend we continue erythropoietin stimulating agents to keep hemoglobin above 10 g. #2 anemia This is likely anemia of chronic disease from his chronic renal failure and MDS. The patient denies recent history of bleeding such as epistaxis, hematuria or hematochezia. He is asymptomatic from the anemia. We will observe for now.  He does not require transfusion now.  I recommend we continue Aranesp injection.  Goal of therapy is to keep hemoglobin above 10 g #3 chronic thrombocytopenia The cause is due to MDS. He is not symptomatic. We will observe only. #4 chronic kidney disease This is stable. I noted very high BUN. He will follow with his cardiologist and nephrologist for medication adjustments at #5 heart failure with cardiomyopathy Her cardiologist recommendation, the patient had pacemaker implantation. Continue supportive care. He is currently enrolled in home hospice.  All questions were answered. The patient knows to call  the clinic with any problems, questions or concerns. No barriers to learning was detected.  I spent 25 minutes counseling the patient face to face. The total time spent in the appointment was 30 minutes and more than 50% was on counseling.     Heath Lark, MD 04/29/2014 8:34 AM

## 2014-04-30 ENCOUNTER — Other Ambulatory Visit: Payer: Medicare Other

## 2014-04-30 ENCOUNTER — Ambulatory Visit: Payer: Medicare Other

## 2014-05-03 ENCOUNTER — Ambulatory Visit (INDEPENDENT_AMBULATORY_CARE_PROVIDER_SITE_OTHER): Payer: Medicare Other | Admitting: Nurse Practitioner

## 2014-05-03 ENCOUNTER — Encounter: Payer: Self-pay | Admitting: Nurse Practitioner

## 2014-05-03 ENCOUNTER — Ambulatory Visit (INDEPENDENT_AMBULATORY_CARE_PROVIDER_SITE_OTHER): Admitting: *Deleted

## 2014-05-03 VITALS — BP 108/52 | HR 60 | Ht 66.0 in | Wt 165.8 lb

## 2014-05-03 DIAGNOSIS — I482 Chronic atrial fibrillation, unspecified: Secondary | ICD-10-CM

## 2014-05-03 DIAGNOSIS — Z950 Presence of cardiac pacemaker: Secondary | ICD-10-CM

## 2014-05-03 DIAGNOSIS — I4891 Unspecified atrial fibrillation: Secondary | ICD-10-CM

## 2014-05-03 DIAGNOSIS — I442 Atrioventricular block, complete: Secondary | ICD-10-CM

## 2014-05-03 LAB — MDC_IDC_ENUM_SESS_TYPE_INCLINIC
Battery Remaining Longevity: 86.4 mo
Battery Voltage: 3.04 V
Brady Statistic RV Percent Paced: 89 %
Date Time Interrogation Session: 20150504170145
Implantable Pulse Generator Serial Number: 7505109
Lead Channel Impedance Value: 450 Ohm
Lead Channel Pacing Threshold Amplitude: 0.5 V
Lead Channel Pacing Threshold Amplitude: 0.5 V
Lead Channel Pacing Threshold Pulse Width: 0.5 ms
Lead Channel Pacing Threshold Pulse Width: 0.5 ms
Lead Channel Sensing Intrinsic Amplitude: 8.5 mV
Lead Channel Setting Pacing Amplitude: 3.5 V
Lead Channel Setting Pacing Pulse Width: 0.5 ms
Lead Channel Setting Sensing Sensitivity: 2 mV
MDC IDC PG MODEL: 1160

## 2014-05-03 NOTE — Progress Notes (Signed)
Douglas George Date of Birth: 1927/10/26 Medical Record #[id number]  History of Present Illness: Douglas George is seen back today for a follow up visit. Seen for Dr. Percival George - former patient of Dr. Susa George. This is a post hospital visit for PPM implant. He has multiple medical issues which include chronic atrial fib, on coumadin, past alcohol abuse, anemia of chronic disease,myelodysplastic syndrome followed by oncology, DM, HLD, depression, ischemic heart disease with prior stenting of the RCA and a residual 60 to 70% LAD stenosis back in 1998 as well as pulmonary HTN. Last stress test was in 2011 showing RV enlargement with no ischemia and a normal EF. Other problems include hypothyroidism, OA and valvular heart disease. He had his last echo back in 2011 showing an EF of 50 to 55%, moderate AI, moderate MR, LAE, RAE, moderate TR and peak PA pressures up to 21mm Hg. He has been managed conservatively and was never cathed. Remains on CPAP for his OSA.   I saw him back in December - seemed to be doing ok - had had a bout of pneumonia and a bout of cellulitis but was improving. Seemed to be failing in a generalized fashion. He was on both ACE and ARB - not clear as to why - but we stopped his ACE.   Seen back several times by me since then. Continued to fail in a generalized fashion. Holter showed intermittent complete heart block and he had single chamber VVI pacer implanted. Also referred to Hospice.   Comes back today. Here with Douglas George his wife. Seems to be holding his own. May feel a little stronger. His joints are his most limiting factor. No chest pain. BP ok. Weight stable from 161 to 165 at home. Very bruised from the procedure. No fever or chills. Has been to hematology and has had his shot for his anemia.   Current Outpatient Prescriptions  Medication Sig Dispense Refill  . ACCU-CHEK AVIVA PLUS test strip USE TO TEST ONCE DAILY AS DIRECTED  50 each  5  . acetaminophen (TYLENOL) 500 MG tablet Take  1,000 mg by mouth every 8 (eight) hours as needed for moderate pain or headache.       . Calcium Carbonate-Vit D-Min (CALCIUM 1200 PO) Take by mouth.      . Cholecalciferol (VITAMIN D) 1000 UNITS capsule Take 1,000 Units by mouth daily.       Marland Kitchen doxazosin (CARDURA) 8 MG tablet Take 4 mg by mouth daily.      . fish oil-omega-3 fatty acids 1000 MG capsule Take 1 g by mouth daily.      . furosemide (LASIX) 80 MG tablet Take 80 mg by mouth daily.      Marland Kitchen glipiZIDE (GLUCOTROL XL) 2.5 MG 24 hr tablet TAKE 1 TABLET BY MOUTH EVERY DAY  30 tablet  5  . levothyroxine (SYNTHROID, LEVOTHROID) 150 MCG tablet Take 150 mcg by mouth daily before breakfast.      . losartan (COZAAR) 50 MG tablet TAKE 1 TABLET BY MOUTH EVERY DAY  30 tablet  0  . Magnesium 300 MG CAPS Take 300 mg by mouth.      . Misc Natural Products (OSTEO BI-FLEX JOINT SHIELD PO) Take 2 tablets by mouth daily. 1 tab in the morning and 1 tab in the evening      . oxybutynin (DITROPAN) 5 MG tablet Take 5 mg by mouth every 6 (six) hours as needed for bladder spasms.       Marland Kitchen  tobramycin (TOBREX) 0.3 % ophthalmic solution Place 1 drop into both eyes 2 (two) times daily.      . vitamin B-12 (CYANOCOBALAMIN) 1000 MCG tablet Take 1,000 mcg by mouth daily.      . vitamin C (ASCORBIC ACID) 500 MG tablet Take 500 mg by mouth daily.      Marland Kitchen warfarin (COUMADIN) 2.5 MG tablet Take 1.25-2.5 mg by mouth daily. 2.5mg /day except 1.25mg  on Wednesday       No current facility-administered medications for this visit.    Allergies  Allergen Reactions  . Keflex [Cephalexin] Other (See Comments)  . Codeine   . Ramipril Cough    Past Medical History  Diagnosis Date  . Hearing loss   . Skin change   . Chronic atrial fibrillation   . Diabetes mellitus type II, controlled   . Hyperlipidemia   . Depression   . Ischemic heart disease     remote stenting of the RCA in 1998 with residual LAD disease of 60 to 70% with negative nuclear in 2011  . Pulmonary  hypertension     per echo in 2011  . Diverticulitis   . H/O blood clots 1990's    in L leg (related to being in a cast for surgery)  . Thrombocytopenia   . Anal fissure   . Neck mass 10/19/2013  . MDS (myelodysplastic syndrome) 12/11/2013  . CHF (congestive heart failure)   . Myocardial infarction 1999  . DVT (deep venous thrombosis) ~ 2009    "LLE"  . Pneumonia ~ 11/2013    "tx'd; not really sure if he had it or not"  . OSA (obstructive sleep apnea)     BiPAP  . Hypothyroidism   . Anemia in chronic kidney disease(285.21) 11/13/2013  . GERD (gastroesophageal reflux disease)   . Arthritis     "joints" (04/21/2014)  . Spinal stenosis, lumbar   . Gout     "little bit in the left foot"  . Complete heart block     s/p single chamber PPM implantation April 2015 ([facility])    Past Surgical History  Procedure Laterality Date  . Thyroidectomy  1960  . Wrist surgery Left ~ 2003    "cut the radial artery"  . Achilles tendon repair Left ~ 2002  . Shrapnel Left     "left shoulder; left hip" [place]   . Inguinal hernia repair Right 1980  . Cataract extraction w/ intraocular lens  implant, bilateral Bilateral ~ 2007  . Vasectomy    . Colonoscopy  2009    polyps in the past  . Insert / replace / remove pacemaker  04/21/2014    single chamber PPM ([facility])  . Coronary angioplasty with stent placement  1999  . Tonsillectomy and adenoidectomy  1930's    History  Smoking status  . Former Smoker -- 2.00 packs/day for 1 years  . Types: Cigarettes  . Quit date: 01/01/1952  Smokeless tobacco  . Never Used    History  Alcohol Use  . 4.2 oz/week  . 7 Glasses of wine per week    Family History  Problem Relation Age of Onset  . Heart failure Mother 54  . Diabetes Mother 65  . Heart attack Father 41  . Stroke Father 66  . Aortic aneurysm Father 71    ABDOMINAL  . Cancer Son     ?  Marland Kitchen Hepatitis Son   . Diabetes Brother     Review of Systems: The  review of  systems is per the HPI.  All other systems were reviewed and are negative.  Physical Exam: BP 108/52  Pulse 60  Ht 5\' 6"  (1.676 m)  Wt 165 lb 12.8 oz (75.206 kg)  BMI 26.77 kg/m2  SpO2 100% Patient is very pleasant and in no acute distress. Looks frail and chronically ill. Skin is warm and dry. Color is normal.  HEENT is unremarkable. Normocephalic/atraumatic. PERRL. Sclera are nonicteric. Neck is supple. No masses. No JVD. Lungs are clear. Cardiac exam shows an irregular rhythm. Pacer in the left upper chest - hematoma noted but no tension on the suture line. Considerable ecchymosis down his left arm and trunk. Abdomen is soft. Extremities are with 1+edema. Has his support stockings in place. No gross neurologic deficits noted.  Wt Readings from Last 3 Encounters:  05/03/14 165 lb 12.8 oz (75.206 kg)  04/28/14 162 lb 6.4 oz (73.664 kg)  04/13/14 168 lb (76.204 kg)      LABORATORY DATA: Lab Results  Component Value Date   WBC 5.1 04/28/2014   HGB 9.6* 04/28/2014   HCT 29.4* 04/28/2014   PLT 128* 04/28/2014   GLUCOSE 146* 04/28/2014   CHOL 89 09/16/2013   TRIG 16.0 09/16/2013   HDL 49.80 09/16/2013   LDLCALC 36 09/16/2013   ALT 18 01/15/2014   AST 20 01/15/2014   NA 141 04/28/2014   K 4.4 04/28/2014   CL 101 04/14/2014   CREATININE 2.0* 04/28/2014   BUN 70.5* 04/28/2014   CO2 21* 04/28/2014   TSH 2.50 09/16/2013   INR 1.7 04/26/2014   HGBA1C 6.6* 09/16/2013   Lab Results  Component Value Date   INR 1.7 04/26/2014   INR 1.78* 04/22/2014   INR 1.76* 04/21/2014     Assessment / Plan: 1. S/P PPM implant - extensive bruising and hematoma noted - no tension on suture line. For device check later today.  2. Chronic atrial fib  3. Pulmonary HTN - managed conservatively  4. CKD  5. Myelodysplasia with anemia  Seems to be holding his own. He is under the care of Hospice. No change in his current regimen. See him back in 4 months.   Patient is agreeable to this plan and will call if any  problems develop in the interim.   Burtis Junes, RN, [facility] [address] [place], [place]  [address] [phone]

## 2014-05-03 NOTE — Patient Instructions (Signed)
Stay on your current medicines  I will see you in 4 months  Call the [facility] office at [phone] if you have any questions, problems or concerns.   

## 2014-05-03 NOTE — Progress Notes (Signed)
Wound check appointment. Steri-strips removed. Hematoma present along with bruising on chest and abdomen---appears to be healing---patient aware to call if area worsens. Incision edges approximated, wound well healed. Normal device function. Threshold, sensing, and impedance consistent with implant measurements. Device programmed at 3.5V for extra safety margin until 3 month visit. Histogram distribution appropriate for patient and level of activity. Permanent AF + Warfarin. No high ventricular rates noted. Patient educated about wound care, arm mobility, lifting restrictions. ROV on 7-23 with GT.

## 2014-05-10 ENCOUNTER — Ambulatory Visit: Payer: Medicare Other | Admitting: *Deleted

## 2014-05-10 ENCOUNTER — Other Ambulatory Visit: Payer: Self-pay | Admitting: *Deleted

## 2014-05-10 ENCOUNTER — Telehealth: Payer: Self-pay | Admitting: *Deleted

## 2014-05-10 ENCOUNTER — Other Ambulatory Visit: Payer: Medicare Other

## 2014-05-10 ENCOUNTER — Encounter: Payer: Self-pay | Admitting: *Deleted

## 2014-05-10 ENCOUNTER — Encounter (HOSPITAL_COMMUNITY): Payer: Self-pay | Admitting: Pharmacy Technician

## 2014-05-10 DIAGNOSIS — T82837A Hemorrhage of cardiac prosthetic devices, implants and grafts, initial encounter: Secondary | ICD-10-CM

## 2014-05-10 DIAGNOSIS — T148XXA Other injury of unspecified body region, initial encounter: Secondary | ICD-10-CM

## 2014-05-10 DIAGNOSIS — Z01812 Encounter for preprocedural laboratory examination: Secondary | ICD-10-CM

## 2014-05-10 NOTE — Progress Notes (Signed)
Pt seen in clinic for bleeding from site. Device implanted 04-21-14 by GT. Site has hematoma in great size and per daughter not sure if getting smaller. Pt is on Warfarin. Pt took shower this morning and wiped off a "black string" thought to be a scab. Once "scab was removed site has been bleeding. Pressure was applied by daughter and brought in. Dr Lovena Le was called and per GT stop Warfarin, eat lots of greens today (spinach, collards, turnip greens) and come to hospital tomorrow for evacuation for last case. Per GT CBC, INR, and BMP to be drawn in Short Stay.

## 2014-05-10 NOTE — Telephone Encounter (Signed)
Device clinic brings the request to schedule this pt for hematoma evacuation, per Dr. Lovena Le. Pt scheduled tomorrow at 9:30 with Dr. Lovena Le, arrive at hospital at 7:30am. Pre procedure labs to be done at hospital in morning. Wound check scheduled for 5/22 at noon. Letter of instructions reviewed with patient, instructed to hold coumadin, eat greens today (per Dr. Tanna Furry advisement) Pt and family verbalized understanding and agreeable to plan.

## 2014-05-11 ENCOUNTER — Telehealth: Payer: Self-pay | Admitting: Internal Medicine

## 2014-05-11 ENCOUNTER — Ambulatory Visit (HOSPITAL_COMMUNITY)
Admission: RE | Admit: 2014-05-11 | Discharge: 2014-05-11 | Disposition: A | Discharge status: Home / Self Care | Source: Ambulatory Visit | Attending: Internal Medicine | Admitting: Internal Medicine

## 2014-05-11 ENCOUNTER — Encounter (HOSPITAL_COMMUNITY)
Admission: RE | Disposition: A | Discharge status: Home / Self Care | Payer: Self-pay | Source: Ambulatory Visit | Attending: Internal Medicine

## 2014-05-11 DIAGNOSIS — IMO0002 Reserved for concepts with insufficient information to code with codable children: Secondary | ICD-10-CM | POA: Insufficient documentation

## 2014-05-11 DIAGNOSIS — Z950 Presence of cardiac pacemaker: Secondary | ICD-10-CM | POA: Insufficient documentation

## 2014-05-11 DIAGNOSIS — I498 Other specified cardiac arrhythmias: Secondary | ICD-10-CM | POA: Insufficient documentation

## 2014-05-11 DIAGNOSIS — E119 Type 2 diabetes mellitus without complications: Secondary | ICD-10-CM | POA: Insufficient documentation

## 2014-05-11 DIAGNOSIS — G4733 Obstructive sleep apnea (adult) (pediatric): Secondary | ICD-10-CM | POA: Insufficient documentation

## 2014-05-11 DIAGNOSIS — T45515A Adverse effect of anticoagulants, initial encounter: Secondary | ICD-10-CM | POA: Insufficient documentation

## 2014-05-11 DIAGNOSIS — E890 Postprocedural hypothyroidism: Secondary | ICD-10-CM | POA: Insufficient documentation

## 2014-05-11 DIAGNOSIS — I4891 Unspecified atrial fibrillation: Secondary | ICD-10-CM | POA: Insufficient documentation

## 2014-05-11 DIAGNOSIS — Z86718 Personal history of other venous thrombosis and embolism: Secondary | ICD-10-CM | POA: Insufficient documentation

## 2014-05-11 DIAGNOSIS — Z87891 Personal history of nicotine dependence: Secondary | ICD-10-CM | POA: Insufficient documentation

## 2014-05-11 DIAGNOSIS — I252 Old myocardial infarction: Secondary | ICD-10-CM | POA: Insufficient documentation

## 2014-05-11 DIAGNOSIS — T82897A Other specified complication of cardiac prosthetic devices, implants and grafts, initial encounter: Secondary | ICD-10-CM

## 2014-05-11 HISTORY — PX: POCKET EVACUATION: SHX6031

## 2014-05-11 HISTORY — PX: POCKET REVISION: SHX5488

## 2014-05-11 LAB — PROTIME-INR
INR: 1.9 — AB (ref 0.00–1.49)
Prothrombin Time: 21.2 seconds — ABNORMAL HIGH (ref 11.6–15.2)

## 2014-05-11 LAB — GLUCOSE, CAPILLARY
Glucose-Capillary: 96 mg/dL (ref 70–99)
Glucose-Capillary: 144 mg/dL — ABNORMAL HIGH (ref 70–99)

## 2014-05-11 SURGERY — POCKET REVISION
Anesthesia: LOCAL

## 2014-05-11 MED ORDER — LIDOCAINE HCL (PF) 1 % IJ SOLN
INTRAMUSCULAR | Status: AC
  Filled 2014-05-11: qty 30

## 2014-05-11 MED ORDER — SODIUM CHLORIDE 0.9 % IR SOLN: 80.0000 mg | Status: DC

## 2014-05-11 MED ORDER — FENTANYL CITRATE 0.05 MG/ML IJ SOLN
INTRAMUSCULAR | Status: AC
  Filled 2014-05-11: qty 2

## 2014-05-11 MED ORDER — SODIUM CHLORIDE 0.9 % IV SOLN
INTRAVENOUS | Status: DC
  Administered 2014-05-11: 09:00:00 via INTRAVENOUS

## 2014-05-11 MED ORDER — ONDANSETRON HCL 4 MG/2ML IJ SOLN: 4.0000 mg | Freq: Four times a day (QID) | INTRAMUSCULAR | Status: DC | PRN

## 2014-05-11 MED ORDER — SODIUM CHLORIDE 0.9 % IR SOLN
Freq: Once | Status: DC
  Filled 2014-05-11: qty 2

## 2014-05-11 MED ORDER — WARFARIN SODIUM 2.5 MG PO TABS: 1.2500 mg | ORAL_TABLET | Freq: Every day | ORAL | Status: DC

## 2014-05-11 MED ORDER — ACETAMINOPHEN 325 MG PO TABS
ORAL_TABLET | ORAL | Status: AC
  Filled 2014-05-11: qty 2

## 2014-05-11 MED ORDER — MIDAZOLAM HCL 5 MG/5ML IJ SOLN
INTRAMUSCULAR | Status: AC
  Filled 2014-05-11: qty 5

## 2014-05-11 MED ORDER — CLINDAMYCIN PHOSPHATE 300 MG/50ML IV SOLN
300.0000 mg | INTRAVENOUS | Status: DC
  Filled 2014-05-11: qty 50

## 2014-05-11 MED ORDER — ACETAMINOPHEN 325 MG PO TABS
325.0000 mg | ORAL_TABLET | ORAL | Status: DC | PRN
  Administered 2014-05-11: 650 mg via ORAL
  Filled 2014-05-11: qty 2

## 2014-05-11 MED ORDER — CHLORHEXIDINE GLUCONATE 4 % EX LIQD
60.0000 mL | Freq: Once | CUTANEOUS | Status: DC
  Filled 2014-05-11: qty 60

## 2014-05-11 MED ORDER — MUPIROCIN CALCIUM 2 % EX CREA
TOPICAL_CREAM | Freq: Two times a day (BID) | CUTANEOUS | Status: DC
  Filled 2014-05-11: qty 15

## 2014-05-11 NOTE — Telephone Encounter (Signed)
Pt scheduled for Thursday.

## 2014-05-11 NOTE — Discharge Instructions (Signed)
° °  Supplemental Discharge Instructions following  Pacemaker Pocket Revision   Activity Please see previous discharge instructions after pacemaker was implanted 04/21/2014.  No heavy lifting or vigorous activity with your left/right arm for 6 to 8 weeks after implant.    NO DRIVING for 1 week; you may begin driving on 68/11/7516, unless otherwise directed by Dr. Lovena Le. WOUND CARE   Keep the wound area clean and dry.  Do not get this area wet for one week. No showers for one week; you may shower on 05/19/2014.   The tape/steri-strips on your wound will fall off; do not pull them off.  No bandage is needed on the site.  DO NOT apply any creams, oils, or ointments to the wound area.   If you notice any drainage or discharge from the wound, any swelling or bruising at the site, or you develop a fever > 101? F after you are discharged home, call the office at once.  Special Instructions   You are still able to use cellular telephones; use the ear opposite the side where you have your pacemaker/defibrillator.  Avoid carrying your cellular phone near your device.   When traveling through airports, show security personnel your identification card to avoid being screened in the metal detectors.  Ask the security personnel to use the hand wand.   Avoid arc welding equipment, MRI testing (magnetic resonance imaging), TENS units (transcutaneous nerve stimulators).  Call the office for questions about other devices.   Avoid electrical appliances that are in poor condition or are not properly grounded.   Microwave ovens are safe to be near or to operate. _____________________________________________________________________________________  HOLD Coumadin until Monday, May 18. Then resume usual / previous dose.

## 2014-05-11 NOTE — Progress Notes (Signed)
Called and spoke with wife to inform her of patient's appt this Thursday 05/13/14 at 1000. Wife stated that she understood instructions and had no further questions.

## 2014-05-11 NOTE — Telephone Encounter (Signed)
New message          Pt says that dr taylor would like for him to come in on this Thursday to have a wound check. Can you add him on?

## 2014-05-12 ENCOUNTER — Ambulatory Visit: Payer: Medicare Other | Admitting: Endocrinology

## 2014-05-13 ENCOUNTER — Ambulatory Visit (INDEPENDENT_AMBULATORY_CARE_PROVIDER_SITE_OTHER): Payer: Medicare Other | Admitting: *Deleted

## 2014-05-13 DIAGNOSIS — I259 Chronic ischemic heart disease, unspecified: Secondary | ICD-10-CM

## 2014-05-13 DIAGNOSIS — I442 Atrioventricular block, complete: Secondary | ICD-10-CM

## 2014-05-13 DIAGNOSIS — I482 Chronic atrial fibrillation, unspecified: Secondary | ICD-10-CM

## 2014-05-13 DIAGNOSIS — I4891 Unspecified atrial fibrillation: Secondary | ICD-10-CM

## 2014-05-13 DIAGNOSIS — T148XXA Other injury of unspecified body region, initial encounter: Secondary | ICD-10-CM

## 2014-05-13 NOTE — H&P (Signed)
Douglas George is an 80 y.o. male.   Chief Complaint: pocket hematoma HPI: the patient is a 80 yo man with chronic atrial fibrillation and symptomatic bradycardia who underwent PPM insertion several weeks ago and was continued on coumadin for stroke prevention and developed a large pocket hematoma and now presents for PPM pocket hematoma evacuation. He denies fever or chills.   Past Medical History  Diagnosis Date  . Hearing loss   . Skin change   . Chronic atrial fibrillation   . Diabetes mellitus type II, controlled   . Hyperlipidemia   . Depression   . Ischemic heart disease     remote stenting of the RCA in 1998 with residual LAD disease of 60 to 70% with negative nuclear in 2011  . Pulmonary hypertension     per echo in 2011  . Diverticulitis   . H/O blood clots 1990's    in L leg (related to being in a cast for surgery)  . Thrombocytopenia   . Anal fissure   . Neck mass 10/19/2013  . MDS (myelodysplastic syndrome) 12/11/2013  . CHF (congestive heart failure)   . Myocardial infarction 1999  . DVT (deep venous thrombosis) ~ 2009    "LLE"  . Pneumonia ~ 11/2013    "tx'd; not really sure if he had it or not"  . OSA (obstructive sleep apnea)     BiPAP  . Hypothyroidism   . Anemia in chronic kidney disease(285.21) 11/13/2013  . GERD (gastroesophageal reflux disease)   . Arthritis     "joints" (04/21/2014)  . Spinal stenosis, lumbar   . Gout     "little bit in the left foot"  . Complete heart block     s/p single chamber PPM implantation April 2015 (Douglas George)    Past Surgical History  Procedure Laterality Date  . Thyroidectomy  1960  . Wrist surgery Left ~ 2003    "cut the radial artery"  . Achilles tendon repair Left ~ 2002  . Shrapnel Left     "left shoulder; left hip" Douglas George   . Inguinal hernia repair Right 1980  . Cataract extraction w/ intraocular lens  implant, bilateral Bilateral ~ 2007  . Vasectomy    . Colonoscopy  2009    polyps in the past  .  Insert / replace / remove pacemaker  04/21/2014    single chamber PPM (Douglas George)  . Coronary angioplasty with stent placement  1999  . Tonsillectomy and adenoidectomy  1930's    Family History  Problem Relation Age of Onset  . Heart failure Mother 54  . Diabetes Mother 66  . Heart attack Father 64  . Stroke Father 38  . Aortic aneurysm Father 47    ABDOMINAL  . Cancer Son     ?  Douglas George Kitchen Hepatitis Son   . Diabetes Brother    Social History:  reports that he quit smoking about 62 years ago. His smoking use included Cigarettes. He has a 2 pack-year smoking history. He has never used smokeless tobacco. He reports that he drinks about 4.2 ounces of alcohol per week. He reports that he does not use illicit drugs.  Allergies:  Allergies  Allergen Reactions  . Keflex [Cephalexin] Other (See Comments)  . Codeine   . Ramipril Cough  . Vancomycin     No prescriptions prior to admission    No results found for this or any previous visit (from the past 48 hour(s)). No results found.  ROS - please see ROS of prior admit. Since then no change except for swelling of his PPM pocket.  Blood pressure 131/43, pulse 60, temperature 97.5 F (36.4 C), temperature source Oral, resp. rate 18, height 5\' 6"  (1.676 m), weight 164 lb (74.39 kg), SpO2 100.00%. Physical Exam   Elderly appearing NAD HEENT: Unremarkable,Douglas George, AT Neck:  6 JVD, no thyromegally Back:  No CVA tenderness Lungs:  Clear with no wheezes, rales, or rhonchi, large pocket hematoma with minimal blood on the incision. HEART:  IRegular rate rhythm, no murmurs, no rubs, no clicks Abd:  soft, positive bowel sounds, no organomegally, no rebound, no guarding Ext:  2 plus pulses, no edema, no cyanosis, no clubbing Skin:  No rashes no nodules Neuro:  CN II through XII intact, motor grossly intact   Assessment/Plan 1. PPM pocket hematoma 2. Chronic atrial fibrillation 3. Chronic coumadin therapy Rec: I have discussed the treatment  options with the paitient and recommended hematoma evacuation. He will hold his coumadin for approx. A week after evacuation to minimize the risk of recurrent hematoma.  Douglas George 05/13/2014, 1:29 PM

## 2014-05-13 NOTE — CV Procedure (Signed)
Electrophysiology procedure note  Procedure: Pacemaker pocket hematoma evacuation  Indication: Pacemaker pocket hematoma  Description of the procedure: After informed consent was obtained, the patient was taken to the diagnostic electrophysiology laboratory in the fasting state. After the usual preparation and draping, intravenous Versed and fentanyl were used for sedation. 30 cc of lidocaine was elevated into the pacemaker pocket insertion site. A 5 cm incision was carried out. Suction was utilized to evacuate the pacemaker pocket hematoma. The pocket was irrigated with copious amounts of antibiotic irrigation. Electrocautery was utilized to assure hemostasis. Additional antibiotic irrigation was utilized to irrigate the pocket. A portion of the skin was removed from each side of the wound. The skin was closed with 2 layers of Vicryl suture. Benzoin and Steri-Strips for pain on the skin. A pressure dressing was placed. The patient was returned to his room in satisfactory condition.  Complications: No immediate procedural complications  Conclusion: Successful pacemaker pocket hematoma evacuation.  Cristopher Peru, M.D.

## 2014-05-17 ENCOUNTER — Encounter (HOSPITAL_COMMUNITY): Payer: Self-pay | Admitting: *Deleted

## 2014-05-17 ENCOUNTER — Encounter: Payer: Self-pay | Admitting: Internal Medicine

## 2014-05-18 NOTE — Progress Notes (Signed)
Patient presents to the office for pressure dressing removal s/p hematoma removal. Site appears to be healing well (GT reviewed). No presence of hematoma. Bruising appears to be resolving. Steri strips left in place until wound recheck. Pressure dressing was not reapplied. Device was not interrogated this session. Patient will follow up on 5-22 as scheduled.

## 2014-05-21 ENCOUNTER — Ambulatory Visit (INDEPENDENT_AMBULATORY_CARE_PROVIDER_SITE_OTHER): Admitting: *Deleted

## 2014-05-21 ENCOUNTER — Encounter: Payer: Self-pay | Admitting: Internal Medicine

## 2014-05-21 ENCOUNTER — Ambulatory Visit: Payer: Medicare Other

## 2014-05-21 ENCOUNTER — Other Ambulatory Visit: Payer: Medicare Other

## 2014-05-21 DIAGNOSIS — I482 Chronic atrial fibrillation, unspecified: Secondary | ICD-10-CM

## 2014-05-21 DIAGNOSIS — I442 Atrioventricular block, complete: Secondary | ICD-10-CM

## 2014-05-21 DIAGNOSIS — I4891 Unspecified atrial fibrillation: Secondary | ICD-10-CM

## 2014-05-21 LAB — MDC_IDC_ENUM_SESS_TYPE_INCLINIC
Date Time Interrogation Session: 20150522154656
Lead Channel Impedance Value: 475 Ohm
Lead Channel Pacing Threshold Amplitude: 0.5 V
Lead Channel Pacing Threshold Amplitude: 0.5 V
Lead Channel Pacing Threshold Pulse Width: 0.5 ms
Lead Channel Setting Pacing Amplitude: 3.5 V
MDC IDC MSMT BATTERY REMAINING LONGEVITY: 86.4 mo
MDC IDC MSMT BATTERY VOLTAGE: 3.04 V
MDC IDC MSMT LEADCHNL RV PACING THRESHOLD PULSEWIDTH: 0.5 ms
MDC IDC MSMT LEADCHNL RV SENSING INTR AMPL: 10.5 mV
MDC IDC PG MODEL: 1160
MDC IDC PG SERIAL: 7505109
MDC IDC SET LEADCHNL RV PACING PULSEWIDTH: 0.5 ms
MDC IDC SET LEADCHNL RV SENSING SENSITIVITY: 2 mV
MDC IDC STAT BRADY RV PERCENT PACED: 89 %

## 2014-05-21 NOTE — Progress Notes (Signed)
Wound check appointment for pocket hematoma removal. Steri-strips removed. Wound without redness or edema. Incision edges approximated, wound well healed. Normal device function. Thresholds, sensing, and impedances consistent with implant measurements. Device programmed at 3.5V for extra safety margin until 3 month visit. Histogram distribution appropriate for patient and level of activity. No high ventricular rates noted. Patient educated about wound care, arm mobility, lifting restrictions. ROV w/ Dr. Lovena Le 08/24/14.

## 2014-05-22 ENCOUNTER — Other Ambulatory Visit: Payer: Self-pay | Admitting: Cardiovascular Disease

## 2014-05-25 ENCOUNTER — Other Ambulatory Visit: Payer: Self-pay

## 2014-05-25 LAB — PROTIME-INR
INR: 1.4
Prothrombin Time: 17.3 secs — ABNORMAL HIGH (ref 11.5–14.7)

## 2014-05-26 ENCOUNTER — Other Ambulatory Visit (HOSPITAL_BASED_OUTPATIENT_CLINIC_OR_DEPARTMENT_OTHER)

## 2014-05-26 ENCOUNTER — Encounter: Payer: Self-pay | Admitting: Cardiology

## 2014-05-26 ENCOUNTER — Other Ambulatory Visit: Payer: Self-pay

## 2014-05-26 ENCOUNTER — Ambulatory Visit: Payer: Medicare Other

## 2014-05-26 DIAGNOSIS — D469 Myelodysplastic syndrome, unspecified: Secondary | ICD-10-CM

## 2014-05-26 DIAGNOSIS — N183 Chronic kidney disease, stage 3 unspecified: Secondary | ICD-10-CM

## 2014-05-26 DIAGNOSIS — I4891 Unspecified atrial fibrillation: Secondary | ICD-10-CM

## 2014-05-26 DIAGNOSIS — Z5181 Encounter for therapeutic drug level monitoring: Secondary | ICD-10-CM

## 2014-05-26 LAB — CBC WITH DIFFERENTIAL/PLATELET
BASO%: 1.1 % (ref 0.0–2.0)
Basophils Absolute: 0.1 10*3/uL (ref 0.0–0.1)
EOS%: 8.2 % — ABNORMAL HIGH (ref 0.0–7.0)
Eosinophils Absolute: 0.4 10*3/uL (ref 0.0–0.5)
HCT: 33.8 % — ABNORMAL LOW (ref 38.4–49.9)
HGB: 10.9 g/dL — ABNORMAL LOW (ref 13.0–17.1)
LYMPH%: 17 % (ref 14.0–49.0)
MCH: 32 pg (ref 27.2–33.4)
MCHC: 32.4 g/dL (ref 32.0–36.0)
MCV: 98.8 fL — AB (ref 79.3–98.0)
MONO#: 0.7 10*3/uL (ref 0.1–0.9)
MONO%: 14 % (ref 0.0–14.0)
NEUT#: 3.2 10*3/uL (ref 1.5–6.5)
NEUT%: 59.7 % (ref 39.0–75.0)
Platelets: 117 10*3/uL — ABNORMAL LOW (ref 140–400)
RBC: 3.42 10*6/uL — AB (ref 4.20–5.82)
RDW: 17.6 % — ABNORMAL HIGH (ref 11.0–14.6)
WBC: 5.3 10*3/uL (ref 4.0–10.3)
lymph#: 0.9 10*3/uL (ref 0.9–3.3)

## 2014-05-26 NOTE — Progress Notes (Signed)
Aranesp Injection not given today, HGB 10.9, Per Dr. Alvy Bimler note patient to receive injection if less than 10.

## 2014-05-27 LAB — POCT INR: INR: 1.4

## 2014-06-01 ENCOUNTER — Ambulatory Visit (INDEPENDENT_AMBULATORY_CARE_PROVIDER_SITE_OTHER): Payer: Medicare Other | Admitting: General Practice

## 2014-06-01 ENCOUNTER — Telehealth: Payer: Self-pay | Admitting: General Practice

## 2014-06-01 DIAGNOSIS — Z5181 Encounter for therapeutic drug level monitoring: Secondary | ICD-10-CM

## 2014-06-01 NOTE — Telephone Encounter (Signed)
Sent orders to check INR in 1 week.

## 2014-06-01 NOTE — Progress Notes (Signed)
Pre visit review using our clinic review tool, if applicable. No additional management support is needed unless otherwise documented below in the visit note. 

## 2014-06-02 ENCOUNTER — Other Ambulatory Visit: Payer: Self-pay

## 2014-06-02 LAB — PROTIME-INR
INR: 1.4
PROTHROMBIN TIME: 16.7 s — AB (ref 11.5–14.7)

## 2014-06-03 ENCOUNTER — Ambulatory Visit (INDEPENDENT_AMBULATORY_CARE_PROVIDER_SITE_OTHER): Payer: Medicare Other | Admitting: General Practice

## 2014-06-03 DIAGNOSIS — Z5181 Encounter for therapeutic drug level monitoring: Secondary | ICD-10-CM

## 2014-06-03 LAB — POCT INR: INR: 1.4

## 2014-06-03 NOTE — Progress Notes (Signed)
Pre visit review using our clinic review tool, if applicable. No additional management support is needed unless otherwise documented below in the visit note. 

## 2014-06-07 ENCOUNTER — Other Ambulatory Visit: Payer: Self-pay | Admitting: Internal Medicine

## 2014-06-08 ENCOUNTER — Other Ambulatory Visit: Payer: Self-pay | Admitting: General Practice

## 2014-06-08 MED ORDER — WARFARIN SODIUM 2.5 MG PO TABS: ORAL_TABLET | ORAL | Status: DC

## 2014-06-09 ENCOUNTER — Other Ambulatory Visit: Payer: Self-pay

## 2014-06-09 LAB — PROTIME-INR
INR: 1.5
Prothrombin Time: 17.9 secs — ABNORMAL HIGH (ref 11.5–14.7)

## 2014-06-11 ENCOUNTER — Ambulatory Visit: Payer: Medicare Other

## 2014-06-11 ENCOUNTER — Ambulatory Visit (INDEPENDENT_AMBULATORY_CARE_PROVIDER_SITE_OTHER): Payer: Medicare Other | Admitting: General Practice

## 2014-06-11 ENCOUNTER — Other Ambulatory Visit: Payer: Medicare Other

## 2014-06-11 DIAGNOSIS — Z5181 Encounter for therapeutic drug level monitoring: Secondary | ICD-10-CM

## 2014-06-11 LAB — POCT INR: INR: 1.5

## 2014-06-11 NOTE — Progress Notes (Signed)
Pre visit review using our clinic review tool, if applicable. No additional management support is needed unless otherwise documented below in the visit note. 

## 2014-06-14 ENCOUNTER — Other Ambulatory Visit: Payer: Self-pay | Admitting: Endocrinology

## 2014-06-18 ENCOUNTER — Other Ambulatory Visit: Payer: Self-pay

## 2014-06-18 ENCOUNTER — Other Ambulatory Visit (INDEPENDENT_AMBULATORY_CARE_PROVIDER_SITE_OTHER)

## 2014-06-18 DIAGNOSIS — E119 Type 2 diabetes mellitus without complications: Secondary | ICD-10-CM

## 2014-06-18 DIAGNOSIS — E039 Hypothyroidism, unspecified: Secondary | ICD-10-CM

## 2014-06-18 LAB — URINALYSIS, ROUTINE W REFLEX MICROSCOPIC
BILIRUBIN URINE: NEGATIVE
Hgb urine dipstick: NEGATIVE
Ketones, ur: NEGATIVE
Leukocytes, UA: NEGATIVE
Nitrite: NEGATIVE
PH: 6 (ref 5.0–8.0)
Specific Gravity, Urine: 1.01 (ref 1.000–1.030)
TOTAL PROTEIN, URINE-UPE24: NEGATIVE
Urine Glucose: NEGATIVE
Urobilinogen, UA: 0.2 (ref 0.0–1.0)

## 2014-06-18 LAB — LIPID PANEL
CHOL/HDL RATIO: 2
Cholesterol: 116 mg/dL (ref 0–200)
HDL: 48.4 mg/dL (ref >39.00)
LDL Cholesterol: 65 mg/dL (ref 0–99)
NONHDL: 67.6
Triglycerides: 11 mg/dL (ref 0.0–149.0)
VLDL: 2.2 mg/dL (ref 0.0–40.0)

## 2014-06-18 LAB — HEMOGLOBIN A1C: Hgb A1c MFr Bld: 6.2 % (ref 4.6–6.5)

## 2014-06-18 LAB — PROTIME-INR
INR: 1.6
Prothrombin Time: 18.6 secs — ABNORMAL HIGH (ref 11.5–14.7)

## 2014-06-18 LAB — T4, FREE: FREE T4: 1.16 ng/dL (ref 0.60–1.60)

## 2014-06-18 LAB — TSH: TSH: 2.06 u[IU]/mL (ref 0.35–4.50)

## 2014-06-19 ENCOUNTER — Other Ambulatory Visit: Payer: Self-pay | Admitting: Cardiovascular Disease

## 2014-06-23 ENCOUNTER — Ambulatory Visit (HOSPITAL_BASED_OUTPATIENT_CLINIC_OR_DEPARTMENT_OTHER)

## 2014-06-23 ENCOUNTER — Ambulatory Visit (INDEPENDENT_AMBULATORY_CARE_PROVIDER_SITE_OTHER): Payer: Medicare Other | Admitting: General Practice

## 2014-06-23 ENCOUNTER — Other Ambulatory Visit: Payer: Self-pay | Admitting: Hematology and Oncology

## 2014-06-23 ENCOUNTER — Other Ambulatory Visit (HOSPITAL_BASED_OUTPATIENT_CLINIC_OR_DEPARTMENT_OTHER)

## 2014-06-23 VITALS — BP 132/36 | HR 60 | Temp 97.3°F

## 2014-06-23 DIAGNOSIS — N183 Chronic kidney disease, stage 3 unspecified: Secondary | ICD-10-CM

## 2014-06-23 DIAGNOSIS — D469 Myelodysplastic syndrome, unspecified: Secondary | ICD-10-CM

## 2014-06-23 DIAGNOSIS — D649 Anemia, unspecified: Secondary | ICD-10-CM

## 2014-06-23 DIAGNOSIS — Z5181 Encounter for therapeutic drug level monitoring: Secondary | ICD-10-CM

## 2014-06-23 DIAGNOSIS — D630 Anemia in neoplastic disease: Secondary | ICD-10-CM

## 2014-06-23 LAB — CBC WITH DIFFERENTIAL/PLATELET
BASO%: 0.9 % (ref 0.0–2.0)
Basophils Absolute: 0.1 10*3/uL (ref 0.0–0.1)
EOS%: 5.3 % (ref 0.0–7.0)
Eosinophils Absolute: 0.3 10*3/uL (ref 0.0–0.5)
HCT: 29.3 % — ABNORMAL LOW (ref 38.4–49.9)
HGB: 9.6 g/dL — ABNORMAL LOW (ref 13.0–17.1)
LYMPH%: 15.4 % (ref 14.0–49.0)
MCH: 31.5 pg (ref 27.2–33.4)
MCHC: 32.8 g/dL (ref 32.0–36.0)
MCV: 96.1 fL (ref 79.3–98.0)
MONO#: 0.7 10*3/uL (ref 0.1–0.9)
MONO%: 13.7 % (ref 0.0–14.0)
NEUT#: 3.5 10*3/uL (ref 1.5–6.5)
NEUT%: 64.7 % (ref 39.0–75.0)
Platelets: 105 10*3/uL — ABNORMAL LOW (ref 140–400)
RBC: 3.05 10*6/uL — ABNORMAL LOW (ref 4.20–5.82)
RDW: 16.5 % — ABNORMAL HIGH (ref 11.0–14.6)
WBC: 5.3 10*3/uL (ref 4.0–10.3)
lymph#: 0.8 10*3/uL — ABNORMAL LOW (ref 0.9–3.3)

## 2014-06-23 LAB — POCT INR: INR: 1.6

## 2014-06-23 MED ORDER — DARBEPOETIN ALFA-POLYSORBATE 300 MCG/0.6ML IJ SOLN
300.0000 ug | Freq: Once | INTRAMUSCULAR | Status: AC
  Administered 2014-06-23: 300 ug via SUBCUTANEOUS
  Filled 2014-06-23: qty 0.6

## 2014-06-23 NOTE — Progress Notes (Signed)
Pre visit review using our clinic review tool, if applicable. No additional management support is needed unless otherwise documented below in the visit note. 

## 2014-06-25 ENCOUNTER — Ambulatory Visit (INDEPENDENT_AMBULATORY_CARE_PROVIDER_SITE_OTHER): Payer: Medicare Other | Admitting: Endocrinology

## 2014-06-25 ENCOUNTER — Other Ambulatory Visit: Payer: Self-pay

## 2014-06-25 ENCOUNTER — Encounter: Payer: Self-pay | Admitting: Endocrinology

## 2014-06-25 VITALS — BP 110/60 | HR 60 | Temp 97.7°F | Resp 12 | Wt 159.0 lb

## 2014-06-25 DIAGNOSIS — E1165 Type 2 diabetes mellitus with hyperglycemia: Principal | ICD-10-CM

## 2014-06-25 DIAGNOSIS — E039 Hypothyroidism, unspecified: Secondary | ICD-10-CM

## 2014-06-25 DIAGNOSIS — E1129 Type 2 diabetes mellitus with other diabetic kidney complication: Secondary | ICD-10-CM

## 2014-06-25 DIAGNOSIS — I10 Essential (primary) hypertension: Secondary | ICD-10-CM

## 2014-06-25 MED ORDER — DOXAZOSIN MESYLATE 2 MG PO TABS: 2.0000 mg | ORAL_TABLET | Freq: Every day | ORAL | Status: DC

## 2014-06-25 NOTE — Patient Instructions (Signed)
Cardura 2mg  daily

## 2014-06-25 NOTE — Progress Notes (Signed)
Patient ID: Douglas George, male   DOB: October 06, 1927, 80 y.o.   MRN: [id number]   Reason for Appointment: Diabetes follow-up   History of Present Illness   Diagnosis: Type 2 DIABETES MELITUS, date of diagnosis:  2003    Previous history: He has had mild diabetes which is consistently well controlled. Previously had been on Actos which was stopped because of potentially worsening his edema. He has been on low-dose glipizide ER subsequently  Recent history: Blood sugars still appear to be overall well controlled; again he is checking readings after evening meal only and  these are generally fairly good. He thinks blood sugars may be higher when he has Ensure but has stopped doing this Has a couple of readings over 150 related to increase carbohydrates No hypoglycemia with taking glipizide ER     Oral hypoglycemic drugs: Glipizide ER. Side effects from medications: ? Edema from Actos       Monitors blood glucose: Once a day.    Glucometer: Accucheck          Blood Glucose readings from meter review: readings before breakfast: Not checked. Evening 103-184 with average 144 for 30 days  Hypoglycemia:  none         Meals: 3 meals per day.  usually trying to restrict carbohydrates         Physical activity:  minimal, unable to do much           Complications: are: None    Wt Readings from Last 3 Encounters:  06/25/14 159 lb (72.122 kg)  05/11/14 164 lb (74.39 kg)  05/11/14 164 lb (74.39 kg)   Lab Results  Component Value Date   HGBA1C 6.2 06/18/2014   HGBA1C 6.6* 09/16/2013   Lab Results  Component Value Date   LDLCALC 65 06/18/2014   CREATININE 2.0* 04/28/2014     PROBLEM 2: Chronic leg edema: This is mostly from venous insufficiency. He is consistent with wearing his elastic stockings Also has been on chronic diuretic, now taking Lasix 80 mg daily.  He sometimes will increase the dose on his own if edema is consistently worse. Has not taken Zaroxolyn in quite some time    HYPERTENSION: He has had long-standing hypertension. Is checking blood pressure at home at times, about 120- 125/70   His blood pressure is somewhat difficult to auscultate and may be unequal in the 2 arms.  Currently on low-dose Cozaar from cardiologist, also taking 4 mg Cardura at bedtime He tries to get up slowly from sitting to avoid dizziness      Medication List       This list is accurate as of: 06/25/14  3:38 PM.  Always use your most recent med list.               ACCU-CHEK AVIVA PLUS test strip  Generic drug:  glucose blood  USE TO TEST ONCE DAILY AS DIRECTED     acetaminophen 500 MG tablet  Commonly known as:  TYLENOL  Take 1,000 mg by mouth every 8 (eight) hours as needed for moderate pain or headache.     CALCIUM 1200 PO  Take 1,200 mg by mouth every Monday, Wednesday, and Friday.     doxazosin 2 MG tablet  Commonly known as:  CARDURA  Take 1 tablet (2 mg total) by mouth daily.     fish oil-omega-3 fatty acids 1000 MG capsule  Take 1 g by mouth daily.     furosemide 80 MG tablet  Commonly known as:  LASIX  Take 80 mg by mouth daily.     glipiZIDE 2.5 MG 24 hr tablet  Commonly known as:  GLUCOTROL XL  Take 2.5 mg by mouth daily with breakfast.     levothyroxine 150 MCG tablet  Commonly known as:  SYNTHROID, LEVOTHROID  Take 150 mcg by mouth daily before breakfast.     levothyroxine 150 MCG tablet  Commonly known as:  SYNTHROID, LEVOTHROID  TAKE 1 TABLET BY MOUTH EVERY MORNING ON AN EMPTY STOMACH     losartan 50 MG tablet  Commonly known as:  COZAAR  TAKE 1 TABLET BY MOUTH EVERY DAY     Magnesium 300 MG Caps  Take 300 mg by mouth.     OSTEO BI-FLEX JOINT SHIELD PO  Take 1 tablet by mouth 2 (two) times daily.     oxybutynin 5 MG tablet  Commonly known as:  DITROPAN  Take 5 mg by mouth every 6 (six) hours as needed for bladder spasms.     tobramycin 0.3 % ophthalmic solution  Commonly known as:  TOBREX  Place 1 drop into both eyes 2 (two)  times daily.     vitamin B-12 1000 MCG tablet  Commonly known as:  CYANOCOBALAMIN  Take 1,000 mcg by mouth daily.     vitamin C 500 MG tablet  Commonly known as:  ASCORBIC ACID  Take 500 mg by mouth daily.     Vitamin D 1000 UNITS capsule  Take 1,000 Units by mouth daily.     warfarin 2.5 MG tablet  Commonly known as:  COUMADIN  Take as directed by anticoagulation clinic        Allergies:  Allergies  Allergen Reactions  . Keflex [Cephalexin] Other (See Comments)  . Codeine   . Ramipril Cough  . Vancomycin     Past Medical History  Diagnosis Date  . Hearing loss   . Skin change   . Chronic atrial fibrillation   . Diabetes mellitus type II, controlled   . Hyperlipidemia   . Depression   . Ischemic heart disease     remote stenting of the RCA in 1998 with residual LAD disease of 60 to 70% with negative nuclear in 2011  . Pulmonary hypertension     per echo in 2011  . Diverticulitis   . H/O blood clots 1990's    in L leg (related to being in a cast for surgery)  . Thrombocytopenia   . Anal fissure   . Neck mass 10/19/2013  . MDS (myelodysplastic syndrome) 12/11/2013  . CHF (congestive heart failure)   . Myocardial infarction 1999  . DVT (deep venous thrombosis) ~ 2009    "LLE"  . Pneumonia ~ 11/2013    "tx'd; not really sure if he had it or not"  . OSA (obstructive sleep apnea)     BiPAP  . Hypothyroidism   . Anemia in chronic kidney disease(285.21) 11/13/2013  . GERD (gastroesophageal reflux disease)   . Arthritis     "joints" (04/21/2014)  . Spinal stenosis, lumbar   . Gout     "little bit in the left foot"  . Complete heart block     s/p single chamber PPM implantation April 2015 ([place])    Past Surgical History  Procedure Laterality Date  . Thyroidectomy  1960  . Wrist surgery Left ~ 2003    "cut the radial artery"  . Achilles tendon repair Left ~ 2002  . Shrapnel Left     "  left shoulder; left hip" [place]   . Inguinal hernia repair  Right 1980  . Cataract extraction w/ intraocular lens  implant, bilateral Bilateral ~ 2007  . Vasectomy    . Colonoscopy  2009    polyps in the past  . Insert / replace / remove pacemaker  04/21/2014    single chamber PPM ([place])  . Coronary angioplasty with stent placement  1999  . Tonsillectomy and adenoidectomy  1930's  . Pocket evacuation  05-11-2014    pocket hematoma evacuation by Dr Lovena Le    Family History  Problem Relation Age of Onset  . Heart failure Mother 31  . Diabetes Mother 64  . Heart attack Father 60  . Stroke Father 60  . Aortic aneurysm Father 80    ABDOMINAL  . Cancer Son     ?  Marland Kitchen Hepatitis Son   . Diabetes Brother     Social History:  reports that he quit smoking about 62 years ago. His smoking use included Cigarettes. He has a 2 pack-year smoking history. He has never used smokeless tobacco. He reports that he drinks about 4.2 ounces of alcohol per week. He reports that he does not use illicit drugs.  Review of Systems:  Hypothyroidism:  he has had long-standing hypothyroidism and has been recently stable with 150 mcg of levothyroxine which he takes in the mornings daily  Lab Results  Component Value Date   FREET4 1.16 06/18/2014   FREET4 1.15 09/16/2013   FREET4 1.70 06/19/2006   TSH 2.06 06/18/2014   TSH 2.50 09/16/2013   TSH 2.23 08/14/2012    Lipids: These have been controlled with Crestor 5 mg , also takes fish oil  Lab Results  Component Value Date   CHOL 116 06/18/2014   HDL 48.40 06/18/2014   LDLCALC 65 06/18/2014   TRIG 11.0 06/18/2014   CHOLHDL 2 06/18/2014     He has had significant osteoarthritis of his hip limiting his activity    Myelodysplastic syndrome: He has had Aranesp with only some improvement in his anemia, followed by oncologist   Lab Results  Component Value Date   WBC 5.3 06/23/2014   HGB 9.6* 06/23/2014   HCT 29.3* 06/23/2014   MCV 96.1 06/23/2014   PLT 105* 06/23/2014      Examination:   BP 110/60  Pulse  60  Temp(Src) 97.7 F (36.5 C) (Oral)  Resp 12  Wt 159 lb (72.122 kg)  SpO2 96%  Body mass index is 25.68 kg/(m^2).    BP 140/50 standing   1-2 + ankle edema present, more on the right side, he is wearing elastic stockings  ASSESSMENT/ PLAN:   1. Diabetes type 2   Blood glucose control appears fairly good with excellent A1c and fairly good readings at home  No hypoglycemia with low-dose glipizide. He will continue the same dose and watch diet.  Currently unable to do much exercise.  Control is adequate for his age  38. Anemia with thrombocytopenia now followed by hematologist. He is periodically getting Aranesp. Also on iron and B12  3. Chronic kidney disease, likely to be from nephrosclerosis.creatinine has been stable around 2.0  4. Long-standing venous edema of legs. This is well-controlled and he is also using elastic stockings.  He can increase his Lasix as needed if edema is worse  5. Hypertension: His blood pressure is relatively low at home and he tends to have mild white coat syndrome Will reduce his Cardura to 2 mg.  His systolic blood pressure is probably adequate at 140-160 considering his age and renal function status  6. Hypothyroidism: Adequately replaced  Bettylee Feig 06/25/2014, 3:38 PM

## 2014-06-28 ENCOUNTER — Telehealth: Payer: Self-pay | Admitting: *Deleted

## 2014-06-28 ENCOUNTER — Ambulatory Visit: Payer: Medicare Other | Admitting: Family Medicine

## 2014-06-28 ENCOUNTER — Ambulatory Visit (INDEPENDENT_AMBULATORY_CARE_PROVIDER_SITE_OTHER): Payer: Medicare Other | Admitting: *Deleted

## 2014-06-28 DIAGNOSIS — Z950 Presence of cardiac pacemaker: Secondary | ICD-10-CM

## 2014-06-28 LAB — MDC_IDC_ENUM_SESS_TYPE_INCLINIC
Brady Statistic RV Percent Paced: 91 %
Implantable Pulse Generator Serial Number: 7505109
Lead Channel Impedance Value: 462.5 Ohm
Lead Channel Pacing Threshold Amplitude: 0.5 V
Lead Channel Pacing Threshold Amplitude: 0.5 V
Lead Channel Pacing Threshold Pulse Width: 0.5 ms
Lead Channel Pacing Threshold Pulse Width: 0.5 ms
Lead Channel Sensing Intrinsic Amplitude: 10.4 mV
Lead Channel Setting Pacing Amplitude: 3.5 V
Lead Channel Setting Sensing Sensitivity: 2 mV
MDC IDC MSMT BATTERY REMAINING LONGEVITY: 84 mo
MDC IDC MSMT BATTERY VOLTAGE: 3.02 V
MDC IDC PG MODEL: 1160
MDC IDC SESS DTM: 20150629142312
MDC IDC SET LEADCHNL RV PACING PULSEWIDTH: 0.5 ms
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
## 2014-07-02 ENCOUNTER — Other Ambulatory Visit: Payer: Medicare Other

## 2014-07-02 ENCOUNTER — Ambulatory Visit: Payer: Medicare Other

## 2014-07-04 ENCOUNTER — Other Ambulatory Visit: Payer: Self-pay | Admitting: Internal Medicine

## 2014-07-05 ENCOUNTER — Other Ambulatory Visit: Payer: Self-pay | Admitting: General Practice

## 2014-07-05 MED ORDER — WARFARIN SODIUM 2.5 MG PO TABS: ORAL_TABLET | ORAL | Status: DC

## 2014-07-07 ENCOUNTER — Ambulatory Visit (INDEPENDENT_AMBULATORY_CARE_PROVIDER_SITE_OTHER): Admitting: General Practice

## 2014-07-07 DIAGNOSIS — Z5181 Encounter for therapeutic drug level monitoring: Secondary | ICD-10-CM

## 2014-07-07 LAB — POCT INR: INR: 2.1

## 2014-07-07 NOTE — Progress Notes (Signed)
Pre visit review using our clinic review tool, if applicable. No additional management support is needed unless otherwise documented below in the visit note. 

## 2014-07-08 ENCOUNTER — Encounter: Payer: Self-pay | Admitting: Internal Medicine

## 2014-07-21 ENCOUNTER — Ambulatory Visit: Payer: Medicare Other

## 2014-07-21 ENCOUNTER — Other Ambulatory Visit (HOSPITAL_BASED_OUTPATIENT_CLINIC_OR_DEPARTMENT_OTHER)

## 2014-07-21 DIAGNOSIS — D630 Anemia in neoplastic disease: Secondary | ICD-10-CM

## 2014-07-21 DIAGNOSIS — N183 Chronic kidney disease, stage 3 unspecified: Secondary | ICD-10-CM

## 2014-07-21 DIAGNOSIS — D469 Myelodysplastic syndrome, unspecified: Secondary | ICD-10-CM

## 2014-07-21 LAB — CBC WITH DIFFERENTIAL/PLATELET
BASO%: 1 % (ref 0.0–2.0)
Basophils Absolute: 0.1 10*3/uL (ref 0.0–0.1)
EOS%: 4.5 % (ref 0.0–7.0)
Eosinophils Absolute: 0.2 10*3/uL (ref 0.0–0.5)
HCT: 31.4 % — ABNORMAL LOW (ref 38.4–49.9)
HGB: 10.1 g/dL — ABNORMAL LOW (ref 13.0–17.1)
LYMPH#: 0.5 10*3/uL — AB (ref 0.9–3.3)
LYMPH%: 9.7 % — AB (ref 14.0–49.0)
MCH: 31.9 pg (ref 27.2–33.4)
MCHC: 32.1 g/dL (ref 32.0–36.0)
MCV: 99.7 fL — ABNORMAL HIGH (ref 79.3–98.0)
MONO#: 0.8 10*3/uL (ref 0.1–0.9)
MONO%: 15.3 % — ABNORMAL HIGH (ref 0.0–14.0)
NEUT#: 3.7 10*3/uL (ref 1.5–6.5)
NEUT%: 69.5 % (ref 39.0–75.0)
PLATELETS: 105 10*3/uL — AB (ref 140–400)
RBC: 3.15 10*6/uL — AB (ref 4.20–5.82)
RDW: 17.5 % — ABNORMAL HIGH (ref 11.0–14.6)
WBC: 5.3 10*3/uL (ref 4.0–10.3)

## 2014-07-21 MED ORDER — DARBEPOETIN ALFA-POLYSORBATE 300 MCG/0.6ML IJ SOLN: 300.0000 ug | Freq: Once | INTRAMUSCULAR | Status: DC

## 2014-07-22 ENCOUNTER — Encounter: Payer: Medicare Other | Admitting: Internal Medicine

## 2014-07-28 ENCOUNTER — Ambulatory Visit (INDEPENDENT_AMBULATORY_CARE_PROVIDER_SITE_OTHER): Payer: Medicare Other | Admitting: General Practice

## 2014-07-28 DIAGNOSIS — Z5181 Encounter for therapeutic drug level monitoring: Secondary | ICD-10-CM

## 2014-07-28 DIAGNOSIS — I4891 Unspecified atrial fibrillation: Secondary | ICD-10-CM

## 2014-07-28 LAB — POCT INR: INR: 1.7

## 2014-07-28 NOTE — Progress Notes (Signed)
Pre visit review using our clinic review tool, if applicable. No additional management support is needed unless otherwise documented below in the visit note. 

## 2014-07-31 ENCOUNTER — Other Ambulatory Visit: Payer: Self-pay | Admitting: Endocrinology

## 2014-08-03 ENCOUNTER — Telehealth: Payer: Self-pay | Admitting: Endocrinology

## 2014-08-03 NOTE — Telephone Encounter (Signed)
Pt needs flourosemide refilled

## 2014-08-04 ENCOUNTER — Other Ambulatory Visit: Payer: Self-pay | Admitting: *Deleted

## 2014-08-04 MED ORDER — FUROSEMIDE 80 MG PO TABS: 80.0000 mg | ORAL_TABLET | Freq: Every day | ORAL | Status: DC

## 2014-08-04 NOTE — Telephone Encounter (Signed)
rx sent

## 2014-08-18 ENCOUNTER — Other Ambulatory Visit (HOSPITAL_BASED_OUTPATIENT_CLINIC_OR_DEPARTMENT_OTHER): Payer: Medicare Other

## 2014-08-18 ENCOUNTER — Ambulatory Visit (HOSPITAL_BASED_OUTPATIENT_CLINIC_OR_DEPARTMENT_OTHER): Payer: Medicare Other

## 2014-08-18 VITALS — BP 110/38 | HR 59 | Temp 97.9°F

## 2014-08-18 DIAGNOSIS — D630 Anemia in neoplastic disease: Secondary | ICD-10-CM

## 2014-08-18 DIAGNOSIS — D469 Myelodysplastic syndrome, unspecified: Secondary | ICD-10-CM

## 2014-08-18 DIAGNOSIS — D649 Anemia, unspecified: Secondary | ICD-10-CM

## 2014-08-18 DIAGNOSIS — N183 Chronic kidney disease, stage 3 unspecified: Secondary | ICD-10-CM

## 2014-08-18 LAB — CBC WITH DIFFERENTIAL/PLATELET
BASO%: 0.9 % (ref 0.0–2.0)
Basophils Absolute: 0.1 10*3/uL (ref 0.0–0.1)
EOS ABS: 0.2 10*3/uL (ref 0.0–0.5)
EOS%: 3.2 % (ref 0.0–7.0)
HCT: 29.5 % — ABNORMAL LOW (ref 38.4–49.9)
HGB: 9.6 g/dL — ABNORMAL LOW (ref 13.0–17.1)
LYMPH%: 10.3 % — ABNORMAL LOW (ref 14.0–49.0)
MCH: 32.3 pg (ref 27.2–33.4)
MCHC: 32.5 g/dL (ref 32.0–36.0)
MCV: 99.6 fL — AB (ref 79.3–98.0)
MONO#: 0.8 10*3/uL (ref 0.1–0.9)
MONO%: 12.5 % (ref 0.0–14.0)
NEUT%: 73.1 % (ref 39.0–75.0)
NEUTROS ABS: 4.4 10*3/uL (ref 1.5–6.5)
Platelets: 110 10*3/uL — ABNORMAL LOW (ref 140–400)
RBC: 2.96 10*6/uL — AB (ref 4.20–5.82)
RDW: 16.7 % — AB (ref 11.0–14.6)
WBC: 6 10*3/uL (ref 4.0–10.3)
lymph#: 0.6 10*3/uL — ABNORMAL LOW (ref 0.9–3.3)

## 2014-08-18 MED ORDER — DARBEPOETIN ALFA-POLYSORBATE 300 MCG/0.6ML IJ SOLN
300.0000 ug | Freq: Once | INTRAMUSCULAR | Status: AC
  Administered 2014-08-18: 300 ug via SUBCUTANEOUS
  Filled 2014-08-18: qty 0.6

## 2014-08-24 ENCOUNTER — Ambulatory Visit (INDEPENDENT_AMBULATORY_CARE_PROVIDER_SITE_OTHER): Payer: Medicare Other | Admitting: Internal Medicine

## 2014-08-24 ENCOUNTER — Encounter: Payer: Self-pay | Admitting: Internal Medicine

## 2014-08-24 VITALS — BP 150/54 | HR 68 | Ht 66.0 in | Wt 162.0 lb

## 2014-08-24 DIAGNOSIS — Z01810 Encounter for preprocedural cardiovascular examination: Secondary | ICD-10-CM

## 2014-08-24 DIAGNOSIS — I482 Chronic atrial fibrillation, unspecified: Secondary | ICD-10-CM

## 2014-08-24 DIAGNOSIS — I259 Chronic ischemic heart disease, unspecified: Secondary | ICD-10-CM

## 2014-08-24 DIAGNOSIS — I4891 Unspecified atrial fibrillation: Secondary | ICD-10-CM

## 2014-08-24 DIAGNOSIS — I442 Atrioventricular block, complete: Secondary | ICD-10-CM

## 2014-08-24 DIAGNOSIS — Z950 Presence of cardiac pacemaker: Secondary | ICD-10-CM | POA: Insufficient documentation

## 2014-08-24 LAB — MDC_IDC_ENUM_SESS_TYPE_INCLINIC
Brady Statistic RV Percent Paced: 87 %
Implantable Pulse Generator Model: 1160
Implantable Pulse Generator Serial Number: 7505109
Lead Channel Setting Pacing Amplitude: 3.5 V
Lead Channel Setting Pacing Pulse Width: 0.5 ms
MDC IDC SET LEADCHNL RV SENSING SENSITIVITY: 2 mV

## 2014-08-24 NOTE — Assessment & Plan Note (Addendum)
The patient is being considered for hernia surgery. The details of this are unclear to me. In general the patient is not a good candidate for surgery with at least moderate if not moderate to severe risk for life-threatening complications because of his multiple medical problems including pulmonary hypertension, atrial fibrillation, ischemic cardiomyopathy, and chronic systolic heart failure. The decision if there is 1 for surgery will be up to the patient and his surgeon based on the risk of not having surgery.

## 2014-08-24 NOTE — Progress Notes (Signed)
HPI Mr. Cozart returns today for ongoing PPM treatment in the setting of atrial fibrillation with intermittant CHB which was associated with dizziness. The patient is a pleasant 80 yo man with a h/o chronic atrial fibrillation, HTN, and chronic venous insufficiency resulting in peripheral edema, refractory to medical therapy.The patient has never had frank syncope. He does have severe fatigue. He is on no reversible medications. His dizzy spells improved significantly after PPM insertion. In the interim he has developed a hernia involving the scrotum. He does not c/o pain from the hernia. No other symptoms. Allergies  Allergen Reactions  . Keflex [Cephalexin] Other (See Comments)  . Codeine   . Ramipril Cough  . Vancomycin      Current Outpatient Prescriptions  Medication Sig Dispense Refill  . ACCU-CHEK AVIVA PLUS test strip USE TO TEST ONCE DAILY AS DIRECTED  50 each  5  . acetaminophen (TYLENOL) 500 MG tablet Take 1,000 mg by mouth every 8 (eight) hours as needed for moderate pain or headache.       . Calcium Carbonate-Vit D-Min (CALCIUM 1200 PO) Take 1,200 mg by mouth every Monday, Wednesday, and Friday.       . Cholecalciferol (VITAMIN D) 1000 UNITS capsule Take 1,000 Units by mouth daily.       . clotrimazole-betamethasone (LOTRISONE) cream Apply 1 application topically daily.      Marland Kitchen doxazosin (CARDURA) 2 MG tablet Take 1 tablet (2 mg total) by mouth daily.  30 tablet  2  . fish oil-omega-3 fatty acids 1000 MG capsule Take 1 g by mouth daily.      . furosemide (LASIX) 80 MG tablet Take 1 tablet (80 mg total) by mouth daily.  30 tablet  5  . glipiZIDE (GLUCOTROL XL) 2.5 MG 24 hr tablet Take 2.5 mg by mouth daily with breakfast.      . levothyroxine (SYNTHROID, LEVOTHROID) 150 MCG tablet Take 150 mcg by mouth daily before breakfast.      . losartan (COZAAR) 50 MG tablet TAKE 1 TABLET BY MOUTH EVERY DAY  30 tablet  3  . Magnesium 300 MG CAPS Take 300 mg by mouth.      . Misc  Natural Products (OSTEO BI-FLEX JOINT SHIELD PO) Take 1 tablet by mouth 2 (two) times daily.       Marland Kitchen oxybutynin (DITROPAN) 5 MG tablet Take 5 mg by mouth every 6 (six) hours as needed for bladder spasms.       Marland Kitchen tobramycin (TOBREX) 0.3 % ophthalmic solution Place 1 drop into both eyes 2 (two) times daily.      . vitamin B-12 (CYANOCOBALAMIN) 1000 MCG tablet Take 1,000 mcg by mouth daily.      . vitamin C (ASCORBIC ACID) 500 MG tablet Take 500 mg by mouth daily.      Marland Kitchen warfarin (COUMADIN) 2.5 MG tablet Take as directed by anticoagulation clinic  35 tablet  3  . glipiZIDE (GLUCOTROL XL) 2.5 MG 24 hr tablet TAKE 1 TABLET BY MOUTH EVERY DAY  30 tablet  3  . levothyroxine (SYNTHROID, LEVOTHROID) 150 MCG tablet TAKE 1 TABLET BY MOUTH EVERY MORNING ON AN EMPTY STOMACH  30 tablet  5   No current facility-administered medications for this visit.     Past Medical History  Diagnosis Date  . Hearing loss   . Skin change   . Chronic atrial fibrillation   . Diabetes mellitus type II, controlled   . Hyperlipidemia   . Depression   .  Ischemic heart disease     remote stenting of the RCA in 1998 with residual LAD disease of 60 to 70% with negative nuclear in 2011  . Pulmonary hypertension     per echo in 2011  . Diverticulitis   . H/O blood clots 1990's    in L leg (related to being in a cast for surgery)  . Thrombocytopenia   . Anal fissure   . Neck mass 10/19/2013  . MDS (myelodysplastic syndrome) 12/11/2013  . CHF (congestive heart failure)   . Myocardial infarction 1999  . DVT (deep venous thrombosis) ~ 2009    "LLE"  . Pneumonia ~ 11/2013    "tx'd; not really sure if he had it or not"  . OSA (obstructive sleep apnea)     BiPAP  . Hypothyroidism   . Anemia in chronic kidney disease(285.21) 11/13/2013  . GERD (gastroesophageal reflux disease)   . Arthritis     "joints" (04/21/2014)  . Spinal stenosis, lumbar   . Gout     "little bit in the left foot"  . Complete heart block     s/p  single chamber PPM implantation April 2015 ([place])    ROS:   All systems reviewed and negative except as noted in the HPI.   Past Surgical History  Procedure Laterality Date  . Thyroidectomy  1960  . Wrist surgery Left ~ 2003    "cut the radial artery"  . Achilles tendon repair Left ~ 2002  . Shrapnel Left     "left shoulder; left hip" [place]   . Inguinal hernia repair Right 1980  . Cataract extraction w/ intraocular lens  implant, bilateral Bilateral ~ 2007  . Vasectomy    . Colonoscopy  2009    polyps in the past  . Insert / replace / remove pacemaker  04/21/2014    single chamber PPM ([place])  . Coronary angioplasty with stent placement  1999  . Tonsillectomy and adenoidectomy  1930's  . Pocket evacuation  05-11-2014    pocket hematoma evacuation by Dr Lovena Le     Family History  Problem Relation Age of Onset  . Heart failure Mother 11  . Diabetes Mother 78  . Heart attack Father 39  . Stroke Father 82  . Aortic aneurysm Father 72    ABDOMINAL  . Cancer Son     ?  Marland Kitchen Hepatitis Son   . Diabetes Brother      History   Social History  . Marital Status: Married    Spouse Name: N/A    Number of Children: 6  . Years of Education: 16+   Occupational History  . Retired     Pharmacist, hospital   Social History Main Topics  . Smoking status: Former Smoker -- 2.00 packs/day for 1 years    Types: Cigarettes    Quit date: 01/01/1952  . Smokeless tobacco: Never Used  . Alcohol Use: 4.2 oz/week    7 Glasses of wine per week  . Drug Use: No  . Sexual Activity: Yes   Other Topics Concern  . Not on file   Social History Narrative   No caffeine use   Regular exercise-no     BP 150/54  Pulse 68  Ht 5\' 6"  (1.676 m)  Wt 162 lb (73.483 kg)  BMI 26.16 kg/m2  Physical Exam:  stable appearing frail, elderly man, NAD HEENT: Unremarkable Neck:  No JVD, no thyromegally Back:  No CVA tenderness Lungs:  Clear  with no wheezes HEART:  Regular  Rhythm  2/6  systolic murmur c/w MR, no rubs, no clicks Abd:  soft, positive bowel sounds, no organomegally, no rebound, no guarding Ext:  2 plus pulses, no edema, no cyanosis, no clubbing Skin:  No rashes no nodules Neuro:  CN II through XII intact, motor grossly intact  PPM - normal device function.   Assess/Plan:

## 2014-08-24 NOTE — Assessment & Plan Note (Signed)
His ventricular rate is controlled. No change in meds. 

## 2014-08-24 NOTE — Assessment & Plan Note (Signed)
He denies anginal symptoms. He is fairly sedentary. No change in medical therapy.

## 2014-08-24 NOTE — Assessment & Plan Note (Signed)
His [employer] pacemaker is working normally. We'll plan to recheck in several months.

## 2014-08-24 NOTE — Patient Instructions (Addendum)
Your physician wants you to follow-up in: April 2016 with Dr. Knox Saliva will receive a reminder letter in the mail two months in advance. If you don't receive a letter, please call our office to schedule the follow-up appointment.  Remote monitoring is used to monitor your Pacemaker or ICD from home. This monitoring reduces the number of office visits required to check your device to one time per year. It allows [place] to keep an eye on the functioning of your device to ensure it is working properly. You are scheduled for a device check from home on 11/29/14. You may send your transmission at any time that day. If you have a wireless device, the transmission will be sent automatically. After your physician reviews your transmission, you will receive a postcard with your next transmission date.

## 2014-08-25 ENCOUNTER — Ambulatory Visit (INDEPENDENT_AMBULATORY_CARE_PROVIDER_SITE_OTHER): Payer: Medicare Other | Admitting: *Deleted

## 2014-08-25 DIAGNOSIS — I4891 Unspecified atrial fibrillation: Secondary | ICD-10-CM

## 2014-08-25 DIAGNOSIS — Z23 Encounter for immunization: Secondary | ICD-10-CM

## 2014-08-25 DIAGNOSIS — Z5181 Encounter for therapeutic drug level monitoring: Secondary | ICD-10-CM

## 2014-08-25 LAB — POCT INR: INR: 1.8

## 2014-08-31 ENCOUNTER — Encounter: Payer: Self-pay | Admitting: Internal Medicine

## 2014-09-03 ENCOUNTER — Ambulatory Visit (INDEPENDENT_AMBULATORY_CARE_PROVIDER_SITE_OTHER): Payer: Medicare Other | Admitting: General Surgery

## 2014-09-08 ENCOUNTER — Other Ambulatory Visit: Payer: Self-pay | Admitting: Endocrinology

## 2014-09-14 ENCOUNTER — Encounter: Payer: Self-pay | Admitting: Nurse Practitioner

## 2014-09-14 ENCOUNTER — Ambulatory Visit (INDEPENDENT_AMBULATORY_CARE_PROVIDER_SITE_OTHER): Payer: Medicare Other | Admitting: Nurse Practitioner

## 2014-09-14 VITALS — BP 110/80 | HR 64 | Ht 66.0 in | Wt 161.8 lb

## 2014-09-14 DIAGNOSIS — Z950 Presence of cardiac pacemaker: Secondary | ICD-10-CM

## 2014-09-14 DIAGNOSIS — I272 Pulmonary hypertension, unspecified: Secondary | ICD-10-CM

## 2014-09-14 DIAGNOSIS — I482 Chronic atrial fibrillation, unspecified: Secondary | ICD-10-CM

## 2014-09-14 DIAGNOSIS — I2789 Other specified pulmonary heart diseases: Secondary | ICD-10-CM

## 2014-09-14 DIAGNOSIS — I4891 Unspecified atrial fibrillation: Secondary | ICD-10-CM

## 2014-09-14 DIAGNOSIS — I259 Chronic ischemic heart disease, unspecified: Secondary | ICD-10-CM

## 2014-09-14 LAB — BASIC METABOLIC PANEL
BUN: 66 mg/dL — ABNORMAL HIGH (ref 6–23)
CO2: 23 mEq/L (ref 19–32)
Calcium: 8.5 mg/dL (ref 8.4–10.5)
Chloride: 104 mEq/L (ref 96–112)
Creatinine, Ser: 1.9 mg/dL — ABNORMAL HIGH (ref 0.4–1.5)
GFR: 35.1 mL/min — ABNORMAL LOW (ref >60.00)
Glucose, Bld: 98 mg/dL (ref 70–99)
Potassium: 4.6 mEq/L (ref 3.5–5.1)
Sodium: 134 mEq/L — ABNORMAL LOW (ref 135–145)

## 2014-09-14 LAB — HEPATIC FUNCTION PANEL
ALT: 11 U/L (ref 0–53)
AST: 15 U/L (ref 0–37)
Albumin: 3.6 g/dL (ref 3.5–5.2)
Alkaline Phosphatase: 90 U/L (ref 39–117)
Bilirubin, Direct: 0.3 mg/dL (ref 0.0–0.3)
Total Bilirubin: 1.3 mg/dL — ABNORMAL HIGH (ref 0.2–1.2)
Total Protein: 7.1 g/dL (ref 6.0–8.3)

## 2014-09-14 LAB — MAGNESIUM: Magnesium: 2.2 mg/dL (ref 1.5–2.5)

## 2014-09-14 NOTE — Patient Instructions (Addendum)
Stay on your current medicines  We will check lab today  I will see you in 4 months  Call the [place] office at [phone] if you have any questions, problems or concerns.

## 2014-09-14 NOTE — Progress Notes (Signed)
Douglas George Date of Birth: [birth year] Medical Record #[id number]  History of Present Illness: Cy is seen back today for a follow up visit. This is a 4 month check. Seen for Dr. Percival Spanish & Lovena Le - former patient of Dr. Susa Simmonds. This is a post hospital visit for PPM implant. He has multiple medical issues which include chronic atrial fib, on coumadin, past alcohol abuse, anemia of chronic disease,myelodysplastic syndrome followed by oncology, DM, HLD, depression, ischemic heart disease with prior stenting of the RCA and a residual 60 to 70% LAD stenosis back in 1998 as well as pulmonary HTN. Last stress test was in 2011 showing RV enlargement with no ischemia and a normal EF. Other problems include hypothyroidism, OA and valvular heart disease. He had his last echo back in 2011 showing an EF of 50 to 55%, moderate AI, moderate MR, LAE, RAE, moderate TR and peak PA pressures up to 58mm Hg. He has been managed conservatively and was never cathed. Remains on CPAP for his OSA.   Has had a general decline over this past year - was actually on Hospice for a few months. Had a  Holter which showed intermittent complete heart block and he had single chamber VVI pacer implanted - complicated by hematoma due to his anticoagulation and subsequently required pocket revision.   Last seen by Dr. Lovena Le in August - wanted to discuss hernia surgery - not felt to be a good candidate for that and would be moderate to moderate/severe risk.   Comes back today. Here with [name] his wife. Seems to be holding his own. Seems more confused with giving his history. No chest pain. Breathing stable. Getting more feeble. Less active. [name] is pretty exhausted. Ok on his medicines.   Current Outpatient Prescriptions  Medication Sig Dispense Refill  . ACCU-CHEK AVIVA PLUS test strip USE TO TEST ONCE DAILY AS DIRECTED  50 each  5  . acetaminophen (TYLENOL) 500 MG tablet Take 1,000 mg by mouth every 8 (eight) hours as needed  for moderate pain or headache.       . Calcium Carbonate-Vit D-Min (CALCIUM 1200 PO) Take 1,200 mg by mouth every Monday, Wednesday, and Friday.       . Cholecalciferol (VITAMIN D) 1000 UNITS capsule Take 1,000 Units by mouth daily.       . clotrimazole-betamethasone (LOTRISONE) cream Apply 1 application topically daily.      Marland Kitchen doxazosin (CARDURA) 2 MG tablet TAKE 1 TABLET BY MOUTH EVERY DAY  30 tablet  3  . fish oil-omega-3 fatty acids 1000 MG capsule Take 1 g by mouth daily.      . furosemide (LASIX) 80 MG tablet Take 1 tablet (80 mg total) by mouth daily.  30 tablet  5  . glipiZIDE (GLUCOTROL XL) 2.5 MG 24 hr tablet TAKE 1 TABLET BY MOUTH EVERY DAY  30 tablet  3  . levothyroxine (SYNTHROID, LEVOTHROID) 150 MCG tablet TAKE 1 TABLET BY MOUTH EVERY MORNING ON AN EMPTY STOMACH  30 tablet  5  . losartan (COZAAR) 50 MG tablet TAKE 1 TABLET BY MOUTH EVERY DAY  30 tablet  3  . Magnesium 300 MG CAPS Take 300 mg by mouth.      . Misc Natural Products (OSTEO BI-FLEX JOINT SHIELD PO) Take 1 tablet by mouth 2 (two) times daily.       Marland Kitchen oxybutynin (DITROPAN) 5 MG tablet Take 5 mg by mouth every 6 (six) hours as needed for bladder spasms.       Marland Kitchen  tobramycin (TOBREX) 0.3 % ophthalmic solution Place 1 drop into both eyes 2 (two) times daily.      . vitamin B-12 (CYANOCOBALAMIN) 1000 MCG tablet Take 1,000 mcg by mouth daily.      . vitamin C (ASCORBIC ACID) 500 MG tablet Take 500 mg by mouth daily.      Marland Kitchen warfarin (COUMADIN) 2.5 MG tablet Take as directed by anticoagulation clinic  35 tablet  3   No current facility-administered medications for this visit.    Allergies  Allergen Reactions  . Keflex [Cephalexin] Other (See Comments)  . Codeine Nausea Only  . Ramipril Cough  . Vancomycin Nausea Only    Past Medical History  Diagnosis Date  . Hearing loss   . Skin change   . Chronic atrial fibrillation   . Diabetes mellitus type II, controlled   . Hyperlipidemia   . Depression   . Ischemic heart  disease     remote stenting of the RCA in 1998 with residual LAD disease of 60 to 70% with negative nuclear in 2011  . Pulmonary hypertension     per echo in 2011  . Diverticulitis   . H/O blood clots 1990's    in L leg (related to being in a cast for surgery)  . Thrombocytopenia   . Anal fissure   . Neck mass 10/19/2013  . MDS (myelodysplastic syndrome) 12/11/2013  . CHF (congestive heart failure)   . Myocardial infarction 1999  . DVT (deep venous thrombosis) ~ 2009    "LLE"  . Pneumonia ~ 11/2013    "tx'd; not really sure if he had it or not"  . OSA (obstructive sleep apnea)     BiPAP  . Hypothyroidism   . Anemia in chronic kidney disease(285.21) 11/13/2013  . GERD (gastroesophageal reflux disease)   . Arthritis     "joints" (04/21/2014)  . Spinal stenosis, lumbar   . Gout     "little bit in the left foot"  . Complete heart block     s/p single chamber PPM implantation April 2015 ([place])    Past Surgical History  Procedure Laterality Date  . Thyroidectomy  1960  . Wrist surgery Left ~ 2003    "cut the radial artery"  . Achilles tendon repair Left ~ 2002  . Shrapnel Left     "left shoulder; left hip" [place]   . Inguinal hernia repair Right 1980  . Cataract extraction w/ intraocular lens  implant, bilateral Bilateral ~ 2007  . Vasectomy    . Colonoscopy  2009    polyps in the past  . Insert / replace / remove pacemaker  04/21/2014    single chamber PPM ([place])  . Coronary angioplasty with stent placement  1999  . Tonsillectomy and adenoidectomy  1930's  . Pocket evacuation  05-11-2014    pocket hematoma evacuation by Dr Lovena Le    History  Smoking status  . Former Smoker -- 2.00 packs/day for 1 years  . Types: Cigarettes  . Quit date: 01/01/1952  Smokeless tobacco  . Never Used    History  Alcohol Use  . 4.2 oz/week  . 7 Glasses of wine per week    Family History  Problem Relation Age of Onset  . Heart failure Mother 23  . Diabetes  Mother 8  . Heart attack Father 50  . Stroke Father 51  . Aortic aneurysm Father 49    ABDOMINAL  . Cancer Son     ?  Marland Kitchen  Hepatitis Son   . Diabetes Brother     Review of Systems: The review of systems is per the HPI.  All other systems were reviewed and are negative.  Physical Exam: BP 110/80  Pulse 64  Ht 5\' 6"  (1.676 m)  Wt 161 lb 12.8 oz (73.392 kg)  BMI 26.13 kg/m2 Patient is very pleasant and in no acute distress but looks frail and declining in generalized fashion. Skin is warm and dry. Color is normal.  HEENT is unremarkable. Normocephalic/atraumatic. PERRL. Sclera are nonicteric. Neck is supple. No masses. No JVD. Lungs are clear. Cardiac exam shows an irregular rhythm. Rate ok. Abdomen is soft. Extremities are with trace edema. He has his support stockings on. Gait not tested. No gross neurologic deficits noted.  Wt Readings from Last 3 Encounters:  09/14/14 161 lb 12.8 oz (73.392 kg)  08/24/14 162 lb (73.483 kg)  06/25/14 159 lb (72.122 kg)    LABORATORY DATA/PROCEDURES: PENDING  Lab Results  Component Value Date   WBC 6.0 08/18/2014   HGB 9.6* 08/18/2014   HCT 29.5* 08/18/2014   PLT 110* 08/18/2014   GLUCOSE 146* 04/28/2014   CHOL 116 06/18/2014   TRIG 11.0 06/18/2014   HDL 48.40 06/18/2014   LDLCALC 65 06/18/2014   ALT 18 01/15/2014   AST 20 01/15/2014   NA 141 04/28/2014   K 4.4 04/28/2014   CL 101 04/14/2014   CREATININE 2.0* 04/28/2014   BUN 70.5* 04/28/2014   CO2 21* 04/28/2014   TSH 2.06 06/18/2014   INR 1.8 08/25/2014   HGBA1C 6.2 06/18/2014    BNP (last 3 results) No results found for this basename: PROBNP,  in the last 8760 hours   Assessment / Plan:  1. S/P PPM implant - complicated by hematoma and required pocket revision -  followed by Dr. Lovena Le  2. Chronic atrial fib - managed with rate control and anticoagulation  3. Pulmonary HTN - managed conservatively   4. CKD   5. Myelodysplasia with anemia  6. Advanced age - looks to continue to  decline in a general fashion. Continue with supportive care. [name] will call Hospice back when needed. Check baseline labs today and I will see again in 4 months.   Patient is agreeable to this plan and will call if any problems develop in the interim.   Burtis Junes, RN, [place] [address] [place], [place]  [address] [phone]

## 2014-09-15 ENCOUNTER — Ambulatory Visit: Payer: Medicare Other

## 2014-09-15 ENCOUNTER — Telehealth: Payer: Self-pay | Admitting: *Deleted

## 2014-09-15 ENCOUNTER — Other Ambulatory Visit (HOSPITAL_BASED_OUTPATIENT_CLINIC_OR_DEPARTMENT_OTHER): Payer: Medicare Other

## 2014-09-15 DIAGNOSIS — D630 Anemia in neoplastic disease: Secondary | ICD-10-CM

## 2014-09-15 DIAGNOSIS — N183 Chronic kidney disease, stage 3 unspecified: Secondary | ICD-10-CM

## 2014-09-15 DIAGNOSIS — D469 Myelodysplastic syndrome, unspecified: Secondary | ICD-10-CM

## 2014-09-15 LAB — CBC WITH DIFFERENTIAL/PLATELET
BASO%: 0.8 % (ref 0.0–2.0)
Basophils Absolute: 0 10*3/uL (ref 0.0–0.1)
EOS%: 4.7 % (ref 0.0–7.0)
Eosinophils Absolute: 0.2 10*3/uL (ref 0.0–0.5)
HCT: 31.4 % — ABNORMAL LOW (ref 38.4–49.9)
HGB: 10 g/dL — ABNORMAL LOW (ref 13.0–17.1)
LYMPH%: 13.8 % — ABNORMAL LOW (ref 14.0–49.0)
MCH: 32.4 pg (ref 27.2–33.4)
MCHC: 31.8 g/dL — ABNORMAL LOW (ref 32.0–36.0)
MCV: 101.8 fL — AB (ref 79.3–98.0)
MONO#: 0.7 10*3/uL (ref 0.1–0.9)
MONO%: 14.8 % — AB (ref 0.0–14.0)
NEUT%: 65.9 % (ref 39.0–75.0)
NEUTROS ABS: 2.9 10*3/uL (ref 1.5–6.5)
Platelets: 97 10*3/uL — ABNORMAL LOW (ref 140–400)
RBC: 3.09 10*6/uL — AB (ref 4.20–5.82)
RDW: 16.1 % — AB (ref 11.0–14.6)
WBC: 4.4 10*3/uL (ref 4.0–10.3)
lymph#: 0.6 10*3/uL — ABNORMAL LOW (ref 0.9–3.3)

## 2014-09-15 MED ORDER — DARBEPOETIN ALFA-POLYSORBATE 300 MCG/0.6ML IJ SOLN: 300.0000 ug | Freq: Once | INTRAMUSCULAR | Status: DC

## 2014-09-15 NOTE — Telephone Encounter (Signed)
S/w shinekwa at Lab stated cancelled the cbc that was drawn yesterday due to machine not working.  Will cancel the lab end due to insurance purposes.  Will get CBC drawn at Cancer center today.

## 2014-09-19 ENCOUNTER — Other Ambulatory Visit: Payer: Self-pay | Admitting: Endocrinology

## 2014-09-20 ENCOUNTER — Ambulatory Visit (INDEPENDENT_AMBULATORY_CARE_PROVIDER_SITE_OTHER): Payer: Medicare Other | Admitting: Emergency Medicine

## 2014-09-20 ENCOUNTER — Encounter: Payer: Self-pay | Admitting: Emergency Medicine

## 2014-09-20 VITALS — BP 142/68 | HR 60 | Temp 97.8°F | Ht 66.0 in | Wt 160.0 lb

## 2014-09-20 DIAGNOSIS — I2789 Other specified pulmonary heart diseases: Secondary | ICD-10-CM

## 2014-09-20 DIAGNOSIS — I272 Pulmonary hypertension, unspecified: Secondary | ICD-10-CM

## 2014-09-20 DIAGNOSIS — G4730 Sleep apnea, unspecified: Secondary | ICD-10-CM

## 2014-09-20 NOTE — Patient Instructions (Signed)
We will re-establish with [facility] to get your BiPAP supplies.  Continue  to use your mask every night Follow with Dr Lamonte Sakai in 12 months or sooner if you have any problems

## 2014-09-20 NOTE — Assessment & Plan Note (Signed)
Secondary PAH. I will leave it to [facility] as to whether he needs a repeat TTE. I doubt he needs one based on his clinical status. Will continue his current regimen.

## 2014-09-20 NOTE — Assessment & Plan Note (Signed)
Tolerates BiPAP well - will continue  Needs to reestablish with [facility] now that he is off hospice.

## 2014-09-20 NOTE — Progress Notes (Signed)
Subjective:    Patient ID: Douglas George, male    DOB: 27-Jun-1927, 80 y.o.   MRN: [id number]  HPI 80 yo former low exposure smoker (2 pk-yrs), hx DM, OSA (dx 15 yrs ago, not on CPAP),  LE DVT (his wife says these were while he was therapeutic on coumadin), HTN with restrictive diastolic dysfxn, CAD (RCA stent '98, LAD dz), moderate AI and MR, RV dilation and PAH by TTE in 2011 (estimated PASP 66mmHg) and then confirmed on TTE January 2/14 (estimated PASP 86 mmHg). Followed by Dr Percival Spanish for these issues and A fib on coumadin.  His most recent TTE was performed to evaluate LE edema, has improved since lasix was increased 1/16. He denies any dyspnea, although activity is limited by his joint disease.   ROV 03/25/13 -- follows up for secondary PAH in setting untreated OSA, chronic VTE, L sided heart disease (CAD, diastolic dysfxn, valvular disease). He underwent full PFT today >> Mild (but real) AFL without BD response, normal volumes, decrease DLCO. Last visit dsat to 90% after 2 laps on RA, no overt drop to <88. Sleep study done but not yet available.  V/Q read as normal on 03/02/13 (has been on anti-coagulation).  Auto-immune panel is negative (2/25).  He is wondering about whether he should get the R heart cath.   ROV 05/15/13 -- Follows for his secondary PAH with w/u and eval as above. He has had his CPAP titration study, results not yet available. Since last time he unfortunately was admitted in [facility] for UTI and heat exhaustion.   ROV 07/1913 -- secondary Douglas identified on TTE last done 2/14. He underwent PSG, has started auto-set BiPAP (5-25), . Returns for follow up. Compliance data is available > shows that he is using > 4 hours 73 % of the time, used it every night. Data recommends starting with 16/12 but there was significant leakage. (SLE 1006 is mask fitting). He feels much better since starting the NIPPV. Less exertional SOB, no longer napping.   ROV 12/07/13 -- secondary PAH, OSA on BiPAP with  moderate compliance. Was seen last month by Dr Gwenette Greet with persistent UA cough. ? The inciting event, was treated w levaquin, seemed to be driven by his ramipril. His breathing is better, has improved since his diuretics were increased at Cardiology.    ROV 04/13/14 -- secondary PAH, OSA on BiPAP. Returns for f/u. Reports that he has been wearing biPAP reliably. He has been having allergies, itchy eyes, more mucous, scratchy throat. He is more SOB last 2 -3 days. He is scheduled for a pacer for A Fib with periods of brady. Suspect this contributes to his PAH. He has B LE edema for the last 3-4 days.    ROV 09/20/14 -- secondary PAH, OSA on BiPAP. He reports that his breathing has been doing fairly well - able to get around. He has good compliance with his BiPAP - uses between 4-7 hours most nights. He was briefly on Hospice, but is now off of it so he will need to be back on [facility] for BiPAP supplies.    Review of Systems  Constitutional: Positive for unexpected weight change. Negative for fever.  HENT: Positive for trouble swallowing. Negative for congestion, dental problem, ear pain, nosebleeds, postnasal drip, rhinorrhea, sinus pressure, sneezing and sore throat.   Eyes: Negative for redness and itching.  Respiratory: Positive for cough. Negative for chest tightness, shortness of breath and wheezing.   Cardiovascular: Negative for palpitations and  leg swelling.  Gastrointestinal: Negative for nausea and vomiting.  Genitourinary: Negative for dysuria.  Musculoskeletal: Negative for joint swelling.  Skin: Negative for rash.  Neurological: Negative for headaches.  Hematological: Does not bruise/bleed easily.  Psychiatric/Behavioral: Negative for dysphoric mood. The patient is not nervous/anxious.     V/Q scan 03/02/13 --  Findings: The ventilation scan is normal. No ventilation defects.  The perfusion lung scan is normal. No segmental or subsegmental  defects to suggest pulmonary embolism.   IMPRESSION:  Normal ventilation perfusion lung scan       Objective:   Physical Exam Filed Vitals:   09/20/14 1455  BP: 142/68  Pulse: 60  Temp: 97.8 F (36.6 C)  Gen: Pleasant, elderly somewhat debilitated, in no distress,  normal affect, uses walker  ENT: No lesions,  mouth clear,  Single prominent telangectasia underneath tongue  Neck: No JVD, no TMG, no carotid bruits  Lungs: No use of accessory muscles, no dullness to percussion, clear without rales or rhonchi  Cardiovascular: irregular, heart sounds normal, no murmur or gallops,   Musculoskeletal: No deformities, no cyanosis or clubbing  Neuro: alert, non focal  Skin: Warm, no lesions or rashes, no edema.   TTE 01/16/13 --  - Left ventricle: The cavity size was normal. Wall thickness was normal. Systolic function was normal. The estimated ejection fraction was in the range of 55% to 60%. Wall motion was normal; there were no regional wall motion abnormalities. Doppler parameters are consistent with restrictive physiology, indicative of decreased left ventricular diastolic compliance and/or increased left atrial pressure. - Aortic valve: Trileaflet; moderately calcified leaflets. There was no stenosis. Mild regurgitation. - Ascending aorta: The visualized portion of the ascending aorta was dilated to 4.1 cm. - Mitral valve: Mildly calcified annulus. There was no evidence for stenosis. Mild regurgitation. - Left atrium: The atrium was severely dilated. - Right ventricle: D-shaped interventricular septum suggestive RV pressure and volume overload. The cavity size was moderately dilated. Systolic function was mildly reduced. - Right atrium: The atrium was severely dilated. - Tricuspid valve: Peak RV-RA gradient: 21mm Hg (S). - Pulmonary arteries: PA peak pressure: 47mm Hg (S). - Systemic veins: IVC dilated to 3.8 cm with no respirophasic variation, suggesting RA pressure 20 mmHg. Impressions:  - Normal LV  size and systolic function, EF 90-24%. Restrictive diastolic function. Moderately dilated RV with mildly decreased systolic function. Severe pulmonary hypertension. Dilated IVC suggesting elevated RV filling pressure.      Assessment & Plan:  Unspecified sleep apnea Tolerates BiPAP well - will continue  Needs to reestablish with [facility] now that he is off hospice.   Pulmonary hypertension Secondary PAH. I will leave it to [facility] as to whether he needs a repeat TTE. I doubt he needs one based on his clinical status. Will continue his current regimen.

## 2014-09-29 ENCOUNTER — Ambulatory Visit (INDEPENDENT_AMBULATORY_CARE_PROVIDER_SITE_OTHER): Payer: Medicare Other | Admitting: *Deleted

## 2014-09-29 DIAGNOSIS — Z5181 Encounter for therapeutic drug level monitoring: Secondary | ICD-10-CM

## 2014-09-29 DIAGNOSIS — I4891 Unspecified atrial fibrillation: Secondary | ICD-10-CM

## 2014-09-29 LAB — POCT INR: INR: 1.9

## 2014-10-13 ENCOUNTER — Other Ambulatory Visit (HOSPITAL_BASED_OUTPATIENT_CLINIC_OR_DEPARTMENT_OTHER): Payer: Medicare Other

## 2014-10-13 ENCOUNTER — Ambulatory Visit (HOSPITAL_BASED_OUTPATIENT_CLINIC_OR_DEPARTMENT_OTHER): Payer: Medicare Other | Admitting: Hematology and Oncology

## 2014-10-13 ENCOUNTER — Ambulatory Visit (HOSPITAL_BASED_OUTPATIENT_CLINIC_OR_DEPARTMENT_OTHER): Payer: Medicare Other

## 2014-10-13 ENCOUNTER — Other Ambulatory Visit: Payer: Self-pay | Admitting: Hematology and Oncology

## 2014-10-13 ENCOUNTER — Telehealth: Payer: Self-pay | Admitting: Hematology and Oncology

## 2014-10-13 ENCOUNTER — Encounter: Payer: Self-pay | Admitting: Hematology and Oncology

## 2014-10-13 VITALS — BP 139/64 | HR 60 | Temp 98.1°F | Resp 16 | Ht 66.0 in | Wt 160.4 lb

## 2014-10-13 DIAGNOSIS — D469 Myelodysplastic syndrome, unspecified: Secondary | ICD-10-CM

## 2014-10-13 DIAGNOSIS — N183 Chronic kidney disease, stage 3 unspecified: Secondary | ICD-10-CM

## 2014-10-13 LAB — CBC WITH DIFFERENTIAL/PLATELET
BASO%: 1 % (ref 0.0–2.0)
Basophils Absolute: 0.1 10*3/uL (ref 0.0–0.1)
EOS%: 3.8 % (ref 0.0–7.0)
Eosinophils Absolute: 0.2 10*3/uL (ref 0.0–0.5)
HCT: 30.2 % — ABNORMAL LOW (ref 38.4–49.9)
HGB: 9.7 g/dL — ABNORMAL LOW (ref 13.0–17.1)
LYMPH#: 0.7 10*3/uL — AB (ref 0.9–3.3)
LYMPH%: 12.6 % — ABNORMAL LOW (ref 14.0–49.0)
MCH: 32.2 pg (ref 27.2–33.4)
MCHC: 31.9 g/dL — ABNORMAL LOW (ref 32.0–36.0)
MCV: 101 fL — ABNORMAL HIGH (ref 79.3–98.0)
MONO#: 0.8 10*3/uL (ref 0.1–0.9)
MONO%: 15 % — ABNORMAL HIGH (ref 0.0–14.0)
NEUT#: 3.7 10*3/uL (ref 1.5–6.5)
NEUT%: 67.6 % (ref 39.0–75.0)
Platelets: 110 10*3/uL — ABNORMAL LOW (ref 140–400)
RBC: 3 10*6/uL — ABNORMAL LOW (ref 4.20–5.82)
RDW: 15.3 % — AB (ref 11.0–14.6)
WBC: 5.4 10*3/uL (ref 4.0–10.3)

## 2014-10-13 MED ORDER — DARBEPOETIN ALFA-POLYSORBATE 300 MCG/0.6ML IJ SOLN
300.0000 ug | Freq: Once | INTRAMUSCULAR | Status: AC
  Administered 2014-10-13: 300 ug via SUBCUTANEOUS
  Filled 2014-10-13: qty 0.6

## 2014-10-13 NOTE — Assessment & Plan Note (Signed)
Clinically, he has responded very well to erythropoietin stimulating agents. According to my chart review, he is only needing to 300 mcg of darbopoeitin every other month. The patient has profound fatigue and limited performance status due to multiple comorbidities including COPD, chronic kidney disease and heart disease. We discussed about the possibility of changing his current parameters to obtain higher hemoglobin level at 11 g, at the expense of the possibility of increased risk of thrombosis and hypertension. The risks, benefits, and side effects of increased dose of darbepoetin were discussed fully with the patient and his wife. Alternatively, if the patient is comfortable with the current prescription of erythropoietin stimulating agents, I could consequently change his future followup to every other month and keep the dosage of darbepoetin the same. The patient could not make up his mind. I went ahead and proceed to schedule his return appointment every other month unless the patient calls back.

## 2014-10-13 NOTE — Progress Notes (Signed)
[facility] OFFICE PROGRESS NOTE  Patient Care Team: Biagio Borg, MD as PCP - General (Internal Medicine) Heath Lark, MD as PCP - Hematology/Oncology (Hematology and Oncology) Collene Gobble, MD as Referring Physician (Pulmonary Disease) Minus Breeding, MD as Attending Physician (Cardiology) Elayne Snare, MD as Attending Physician (Endocrinology) Evans Lance, MD as Consulting Physician (Cardiology)  SUMMARY OF ONCOLOGIC HISTORY: This is a patient who has been followed here for many years. He had history of chronic anemia for some time and had a bone marrow aspirate and biopsy done in 2007 that was unremarkable. Due to chronic anemia and chronic kidney disease he was referred to the [facility] to receive erythropoietin stimulating agents. Due to iron deficiency anemia, he had received one dose of intravenous iron infusion in January 2014 and darbepoetin injection since June of 2014. On 10/30/2013, he had bone marrow aspirate and biopsy showed dyspoietic changes suggested for early myelodysplastic syndrome. We resume erythropoietin stimulating agents every other week to keep hemoglobin above 10 g. the frequency of erythropoietin stimulating agents is subsequently changed to every 4 weeks. In April 2015, the patient had permanent pacemaker implantation for irregular heartbeat and congestive heart failure.  INTERVAL HISTORY: Please see below for problem oriented charting. He is doing well with no recent exacerbation of congestive heart failure. He bruises easily. The patient denies any recent signs or symptoms of bleeding such as spontaneous epistaxis, hematuria or hematochezia. He complained of intermittent inguinal hernia pain but was told he is too frail to undergo any surgery. He denies recent cough. He is shortness of breath on minimal exertion. He denies recent infection.  REVIEW OF SYSTEMS:   Constitutional: Denies fevers, chills or abnormal weight loss Eyes: Denies  blurriness of vision Ears, nose, mouth, throat, and face: Denies mucositis or sore throat Cardiovascular: Denies palpitation, chest discomfort or lower extremity swelling Gastrointestinal:  Denies nausea, heartburn or change in bowel habits Skin: Denies abnormal skin rashes Lymphatics: Denies new lymphadenopathy  Neurological:Denies numbness, tingling or new weaknesses Behavioral/Psych: Mood is stable, no new changes  All other systems were reviewed with the patient and are negative.  I have reviewed the past medical history, past surgical history, social history and family history with the patient and they are unchanged from previous note.  ALLERGIES:  is allergic to keflex; codeine; ramipril; and vancomycin.  MEDICATIONS:  Current Outpatient Prescriptions  Medication Sig Dispense Refill  . docusate sodium (COLACE) 100 MG capsule Take 100 mg by mouth 2 (two) times daily.      . polyethylene glycol (MIRALAX / GLYCOLAX) packet Take 17 g by mouth daily.      Marland Kitchen ACCU-CHEK AVIVA PLUS test strip USE TO TEST ONCE DAILY AS DIRECTED  50 each  5  . acetaminophen (TYLENOL) 500 MG tablet Take 1,000 mg by mouth every 8 (eight) hours as needed for moderate pain or headache.       . Calcium Carbonate-Vit D-Min (CALCIUM 1200 PO) Take 1,200 mg by mouth daily.       . Cholecalciferol (VITAMIN D) 1000 UNITS capsule Take 1,000 Units by mouth daily.       . clotrimazole-betamethasone (LOTRISONE) cream Apply 1 application topically daily.      Marland Kitchen doxazosin (CARDURA) 2 MG tablet TAKE 1 TABLET BY MOUTH EVERY DAY  30 tablet  3  . fish oil-omega-3 fatty acids 1000 MG capsule Take 1 g by mouth daily.      . furosemide (LASIX) 80 MG tablet Take 1  tablet (80 mg total) by mouth daily.  30 tablet  5  . glipiZIDE (GLUCOTROL XL) 2.5 MG 24 hr tablet TAKE 1 TABLET BY MOUTH EVERY DAY  30 tablet  3  . levothyroxine (SYNTHROID, LEVOTHROID) 150 MCG tablet TAKE 1 TABLET BY MOUTH EVERY MORNING ON AN EMPTY STOMACH  30 tablet  5   . losartan (COZAAR) 50 MG tablet TAKE 1 TABLET BY MOUTH EVERY DAY  30 tablet  3  . Magnesium 300 MG CAPS Take 300 mg by mouth.      . Misc Natural Products (OSTEO BI-FLEX JOINT SHIELD PO) Take 1 tablet by mouth 2 (two) times daily.       Marland Kitchen oxybutynin (DITROPAN) 5 MG tablet Take 5 mg by mouth every 6 (six) hours as needed for bladder spasms.       Marland Kitchen tobramycin (TOBREX) 0.3 % ophthalmic solution Place 1 drop into both eyes 2 (two) times daily.      . vitamin B-12 (CYANOCOBALAMIN) 1000 MCG tablet Take 1,000 mcg by mouth daily.      . vitamin C (ASCORBIC ACID) 500 MG tablet Take 500 mg by mouth daily.      Marland Kitchen warfarin (COUMADIN) 2.5 MG tablet Take as directed by anticoagulation clinic  35 tablet  3   No current facility-administered medications for this visit.    PHYSICAL EXAMINATION: ECOG PERFORMANCE STATUS: 2 - Symptomatic, <50% confined to bed  Filed Vitals:   10/13/14 1157  BP: 139/64  Pulse: 60  Temp: 98.1 F (36.7 C)  Resp: 16   Filed Weights   10/13/14 1157  Weight: 160 lb 6.4 oz (72.757 kg)    GENERAL:alert, no distress and comfortable. He appeared eldely SKIN: skin color, texture, turgor are normal, no rashes or significant lesions. Extensive bruises are noted EYES: normal, Conjunctiva are pink and non-injected, sclera clear Musculoskeletal:no cyanosis of digits and no clubbing  NEURO: alert & oriented x 3 with fluent speech, no focal motor/sensory deficits  LABORATORY DATA:  I have reviewed the data as listed    Component Value Date/Time   NA 134* 09/14/2014 1545   NA 141 04/28/2014 0951   K 4.6 09/14/2014 1545   K 4.4 04/28/2014 0951   CL 104 09/14/2014 1545   CL 104 06/19/2013 1517   CO2 23 09/14/2014 1545   CO2 21* 04/28/2014 0951   GLUCOSE 98 09/14/2014 1545   GLUCOSE 146* 04/28/2014 0951   GLUCOSE 90 06/19/2013 1517   BUN 66* 09/14/2014 1545   BUN 70.5* 04/28/2014 0951   CREATININE 1.9* 09/14/2014 1545   CREATININE 2.0* 04/28/2014 0951   CALCIUM 8.5 09/14/2014 1545    CALCIUM 9.1 04/28/2014 0951   PROT 7.1 09/14/2014 1545   PROT 7.2 01/15/2014 1317   ALBUMIN 3.6 09/14/2014 1545   ALBUMIN 3.7 01/15/2014 1317   AST 15 09/14/2014 1545   AST 20 01/15/2014 1317   ALT 11 09/14/2014 1545   ALT 18 01/15/2014 1317   ALKPHOS 90 09/14/2014 1545   ALKPHOS 123 01/15/2014 1317   BILITOT 1.3* 09/14/2014 1545   BILITOT 0.60 01/15/2014 1317    No results found for this basename: SPEP,  UPEP,   kappa and lambda light chains    Lab Results  Component Value Date   WBC 5.4 10/13/2014   NEUTROABS 3.7 10/13/2014   HGB 9.7* 10/13/2014   HCT 30.2* 10/13/2014   MCV 101.0* 10/13/2014   PLT 110* 10/13/2014      Chemistry  Component Value Date/Time   NA 134* 09/14/2014 1545   NA 141 04/28/2014 0951   K 4.6 09/14/2014 1545   K 4.4 04/28/2014 0951   CL 104 09/14/2014 1545   CL 104 06/19/2013 1517   CO2 23 09/14/2014 1545   CO2 21* 04/28/2014 0951   BUN 66* 09/14/2014 1545   BUN 70.5* 04/28/2014 0951   CREATININE 1.9* 09/14/2014 1545   CREATININE 2.0* 04/28/2014 0951      Component Value Date/Time   CALCIUM 8.5 09/14/2014 1545   CALCIUM 9.1 04/28/2014 0951   ALKPHOS 90 09/14/2014 1545   ALKPHOS 123 01/15/2014 1317   AST 15 09/14/2014 1545   AST 20 01/15/2014 1317   ALT 11 09/14/2014 1545   ALT 18 01/15/2014 1317   BILITOT 1.3* 09/14/2014 1545   BILITOT 0.60 01/15/2014 1317      ASSESSMENT & PLAN:  MDS (myelodysplastic syndrome) Clinically, he has responded very well to erythropoietin stimulating agents. According to my chart review, he is only needing to 300 mcg of darbopoeitin every other month. The patient has profound fatigue and limited performance status due to multiple comorbidities including COPD, chronic kidney disease and heart disease. We discussed about the possibility of changing his current parameters to obtain higher hemoglobin level at 11 g, at the expense of the possibility of increased risk of thrombosis and hypertension. The risks, benefits, and side effects of  increased dose of darbepoetin were discussed fully with the patient and his wife. Alternatively, if the patient is comfortable with the current prescription of erythropoietin stimulating agents, I could consequently change his future followup to every other month and keep the dosage of darbepoetin the same. The patient could not make up his mind. I went ahead and proceed to schedule his return appointment every other month unless the patient calls back.    No orders of the defined types were placed in this encounter.   All questions were answered. The patient knows to call the clinic with any problems, questions or concerns. No barriers to learning was detected. I spent 25 minutes counseling the patient face to face. The total time spent in the appointment was 30 minutes and more than 50% was on counseling and review of test results     [facility], [place], MD 10/13/2014 9:33 PM

## 2014-10-13 NOTE — Patient Instructions (Signed)
Darbepoetin Alfa injection What is this medicine? DARBEPOETIN ALFA (dar be POE e tin AL fa) helps your body make more red blood cells. It is used to treat anemia caused by chronic kidney failure and chemotherapy. This medicine may be used for other purposes; ask your health care provider or pharmacist if you have questions. COMMON BRAND NAME(S): Aranesp What should I tell my health care provider before I take this medicine? They need to know if you have any of these conditions: -blood clotting disorders or history of blood clots -cancer patient not on chemotherapy -cystic fibrosis -heart disease, such as angina, heart failure, or a history of a heart attack -hemoglobin level of 12 g/dL or greater -high blood pressure -low levels of folate, iron, or vitamin B12 -seizures -an unusual or allergic reaction to darbepoetin, erythropoietin, albumin, hamster proteins, latex, other medicines, foods, dyes, or preservatives -pregnant or trying to get pregnant -breast-feeding How should I use this medicine? This medicine is for injection into a vein or under the skin. It is usually given by a health care professional in a hospital or clinic setting. If you get this medicine at home, you will be taught how to prepare and give this medicine. Do not shake the solution before you withdraw a dose. Use exactly as directed. Take your medicine at regular intervals. Do not take your medicine more often than directed. It is important that you put your used needles and syringes in a special sharps container. Do not put them in a trash can. If you do not have a sharps container, call your pharmacist or healthcare provider to get one. Talk to your pediatrician regarding the use of this medicine in children. While this medicine may be used in children as young as 1 year for selected conditions, precautions do apply. Overdosage: If you think you have taken too much of this medicine contact a poison control center or  emergency room at once. NOTE: This medicine is only for you. Do not share this medicine with others. What if I miss a dose? If you miss a dose, take it as soon as you can. If it is almost time for your next dose, take only that dose. Do not take double or extra doses. What may interact with this medicine? Do not take this medicine with any of the following medications: -epoetin alfa This list may not describe all possible interactions. Give your health care provider a list of all the medicines, herbs, non-prescription drugs, or dietary supplements you use. Also tell them if you smoke, drink alcohol, or use illegal drugs. Some items may interact with your medicine. What should I watch for while using this medicine? Visit your prescriber or health care professional for regular checks on your progress and for the needed blood tests and blood pressure measurements. It is especially important for the doctor to make sure your hemoglobin level is in the desired range, to limit the risk of potential side effects and to give you the best benefit. Keep all appointments for any recommended tests. Check your blood pressure as directed. Ask your doctor what your blood pressure should be and when you should contact him or her. As your body makes more red blood cells, you may need to take iron, folic acid, or vitamin B supplements. Ask your doctor or health care provider which products are right for you. If you have kidney disease continue dietary restrictions, even though this medication can make you feel better. Talk with your doctor or health   care professional about the foods you eat and the vitamins that you take. What side effects may I notice from receiving this medicine? Side effects that you should report to your doctor or health care professional as soon as possible: -allergic reactions like skin rash, itching or hives, swelling of the face, lips, or tongue -breathing problems -changes in vision -chest  pain -confusion, trouble speaking or understanding -feeling faint or lightheaded, falls -high blood pressure -muscle aches or pains -pain, swelling, warmth in the leg -rapid weight gain -severe headaches -sudden numbness or weakness of the face, arm or leg -trouble walking, dizziness, loss of balance or coordination -seizures (convulsions) -swelling of the ankles, feet, hands -unusually weak or tired Side effects that usually do not require medical attention (report to your doctor or health care professional if they continue or are bothersome): -diarrhea -fever, chills (flu-like symptoms) -headaches -nausea, vomiting -redness, stinging, or swelling at site where injected This list may not describe all possible side effects. Call your doctor for medical advice about side effects. You may report side effects to FDA at 1-800-FDA-1088. Where should I keep my medicine? Keep out of the reach of children. Store in a refrigerator between 2 and 8 degrees C (36 and 46 degrees F). Do not freeze. Do not shake. Throw away any unused portion if using a single-dose vial. Throw away any unused medicine after the expiration date. NOTE: This sheet is a summary. It may not cover all possible information. If you have questions about this medicine, talk to your doctor, pharmacist, or health care provider.  2015, [employer]vier/[employer] Standard. (2008-11-30 10:23:57)  

## 2014-10-13 NOTE — Telephone Encounter (Signed)
Pt confirmed labs/ov/inj per 10/14 POF, gave pt AVS.... KJ

## 2014-10-20 ENCOUNTER — Other Ambulatory Visit (INDEPENDENT_AMBULATORY_CARE_PROVIDER_SITE_OTHER): Payer: Medicare Other

## 2014-10-20 DIAGNOSIS — I10 Essential (primary) hypertension: Secondary | ICD-10-CM

## 2014-10-20 DIAGNOSIS — E1129 Type 2 diabetes mellitus with other diabetic kidney complication: Secondary | ICD-10-CM

## 2014-10-20 DIAGNOSIS — E1165 Type 2 diabetes mellitus with hyperglycemia: Secondary | ICD-10-CM

## 2014-10-20 DIAGNOSIS — E039 Hypothyroidism, unspecified: Secondary | ICD-10-CM

## 2014-10-20 DIAGNOSIS — IMO0002 Reserved for concepts with insufficient information to code with codable children: Secondary | ICD-10-CM

## 2014-10-20 LAB — HEMOGLOBIN A1C: HEMOGLOBIN A1C: 6 % (ref 4.6–6.5)

## 2014-10-21 LAB — LIPID PANEL
CHOLESTEROL: 117 mg/dL (ref 0–200)
HDL: 47.7 mg/dL (ref >39.00)
LDL Cholesterol: 67 mg/dL (ref 0–99)
NonHDL: 69.3
TRIGLYCERIDES: 11 mg/dL (ref 0.0–149.0)
Total CHOL/HDL Ratio: 2
VLDL: 2.2 mg/dL (ref 0.0–40.0)

## 2014-10-21 LAB — COMPREHENSIVE METABOLIC PANEL
ALBUMIN: 3.4 g/dL — AB (ref 3.5–5.2)
ALK PHOS: 111 U/L (ref 39–117)
ALT: 14 U/L (ref 0–53)
AST: 16 U/L (ref 0–37)
BUN: 67 mg/dL — AB (ref 6–23)
CALCIUM: 8.9 mg/dL (ref 8.4–10.5)
CHLORIDE: 105 meq/L (ref 96–112)
CO2: 21 mEq/L (ref 19–32)
Creatinine, Ser: 2.1 mg/dL — ABNORMAL HIGH (ref 0.4–1.5)
GFR: 31.51 mL/min — ABNORMAL LOW (ref >60.00)
Glucose, Bld: 114 mg/dL — ABNORMAL HIGH (ref 70–99)
Potassium: 4.6 mEq/L (ref 3.5–5.1)
Sodium: 137 mEq/L (ref 135–145)
Total Bilirubin: 0.9 mg/dL (ref 0.2–1.2)
Total Protein: 7.5 g/dL (ref 6.0–8.3)

## 2014-10-21 LAB — TSH: TSH: 1.74 u[IU]/mL (ref 0.35–4.50)

## 2014-10-23 ENCOUNTER — Other Ambulatory Visit: Payer: Self-pay | Admitting: Cardiovascular Disease

## 2014-10-25 ENCOUNTER — Ambulatory Visit (INDEPENDENT_AMBULATORY_CARE_PROVIDER_SITE_OTHER): Payer: Medicare Other | Admitting: Endocrinology

## 2014-10-25 ENCOUNTER — Encounter: Payer: Self-pay | Admitting: Endocrinology

## 2014-10-25 VITALS — BP 136/68 | HR 60 | Temp 98.3°F | Resp 14 | Ht 66.0 in | Wt 160.0 lb

## 2014-10-25 DIAGNOSIS — E119 Type 2 diabetes mellitus without complications: Secondary | ICD-10-CM

## 2014-10-25 DIAGNOSIS — R609 Edema, unspecified: Secondary | ICD-10-CM

## 2014-10-25 DIAGNOSIS — I10 Essential (primary) hypertension: Secondary | ICD-10-CM

## 2014-10-25 NOTE — Progress Notes (Signed)
Patient ID: Douglas George, male   DOB: Feb 18, 1927, 80 y.o.   MRN: [id number]   Reason for Appointment: Diabetes follow-up   History of Present Illness   Diagnosis: Type 2 DIABETES MELITUS, date of diagnosis:  2003    Previous history: He has had mild diabetes which is consistently well controlled. Previously had been on Actos which was stopped because of potentially worsening his edema. He has been on low-dose glipizide ER subsequently  Recent history: Blood sugars still appear to be overall well controlled as judged by his A1c He is checking readings after evening meal only and  these are generally fairly good.  Unable to get his monitor download because of incorrect date today He thinks blood sugars may be higher when he has more carbohydrate and this is infrequent No hypoglycemia reported, he does note the symptoms to recognize low sugars     Oral hypoglycemic drugs: Glipizide ER 2.5 mg daily. Side effects from medications: ? Edema from Actos       Monitors blood glucose:  every other day or so.    Glucometer: Accucheck          Blood Glucose readings from meter review: Evening generally about 6 to 10 PM: 100-181   Hypoglycemia:  none         Meals: 3 meals per day.  usually trying to watch fat intake and carbohydrates         Physical activity:  minimal, unable to do much           Complications: are: None    Wt Readings from Last 3 Encounters:  10/25/14 160 lb (72.576 kg)  10/13/14 160 lb 6.4 oz (72.757 kg)  09/20/14 160 lb (72.576 kg)   Lab Results  Component Value Date   HGBA1C 6.0 10/20/2014   HGBA1C 6.2 06/18/2014   HGBA1C 6.6* 09/16/2013   Lab Results  Component Value Date   LDLCALC 67 10/20/2014   CREATININE 2.1* 10/20/2014     PROBLEM 2: Chronic leg edema:  He does not think this is any worse. Edema is likely from venous insufficiency primarily.  He is consistent with wearing his elastic stockings Also has been taking chronic diuretics, now taking Lasix 80  mg daily.  He has not increased the dose lately  HYPERTENSION: He has had long-standing hypertension.  Is not checking blood pressure regularly now   His blood pressure is somewhat difficult to auscultate and may be unequal in the 2 arms.  Currently on low-dose Cozaar from cardiologist, also taking 4 mg Cardura at bedtime He tries to get up slowly from sitting to avoid dizziness      Medication List       This list is accurate as of: 10/25/14  2:13 PM.  Always use your most recent med list.               ACCU-CHEK AVIVA PLUS test strip  Generic drug:  glucose blood  USE TO TEST ONCE DAILY AS DIRECTED     acetaminophen 500 MG tablet  Commonly known as:  TYLENOL  Take 1,000 mg by mouth every 8 (eight) hours as needed for moderate pain or headache.     CALCIUM 1200 PO  Take 1,200 mg by mouth daily.     clotrimazole-betamethasone cream  Commonly known as:  LOTRISONE  Apply 1 application topically daily.     docusate sodium 100 MG capsule  Commonly known as:  COLACE  Take 100 mg by mouth  2 (two) times daily.     doxazosin 2 MG tablet  Commonly known as:  CARDURA  TAKE 1 TABLET BY MOUTH EVERY DAY     fish oil-omega-3 fatty acids 1000 MG capsule  Take 1 g by mouth daily.     furosemide 80 MG tablet  Commonly known as:  LASIX  Take 1 tablet (80 mg total) by mouth daily.     glipiZIDE 2.5 MG 24 hr tablet  Commonly known as:  GLUCOTROL XL  TAKE 1 TABLET BY MOUTH EVERY DAY     levothyroxine 150 MCG tablet  Commonly known as:  SYNTHROID, LEVOTHROID  TAKE 1 TABLET BY MOUTH EVERY MORNING ON AN EMPTY STOMACH     losartan 50 MG tablet  Commonly known as:  COZAAR  TAKE 1 TABLET BY MOUTH EVERY DAY     Magnesium 300 MG Caps  Take 300 mg by mouth.     OSTEO BI-FLEX JOINT SHIELD PO  Take 1 tablet by mouth 2 (two) times daily.     oxybutynin 5 MG tablet  Commonly known as:  DITROPAN  Take 5 mg by mouth every 6 (six) hours as needed for bladder spasms.      polyethylene glycol packet  Commonly known as:  MIRALAX / GLYCOLAX  Take 17 g by mouth daily.     tobramycin 0.3 % ophthalmic solution  Commonly known as:  TOBREX  Place 1 drop into both eyes 2 (two) times daily.     vitamin B-12 1000 MCG tablet  Commonly known as:  CYANOCOBALAMIN  Take 1,000 mcg by mouth daily.     vitamin C 500 MG tablet  Commonly known as:  ASCORBIC ACID  Take 500 mg by mouth daily.     Vitamin D 1000 UNITS capsule  Take 1,000 Units by mouth daily.     warfarin 2.5 MG tablet  Commonly known as:  COUMADIN  Take as directed by anticoagulation clinic        Allergies:  Allergies  Allergen Reactions  . Keflex [Cephalexin] Other (See Comments)  . Codeine Nausea Only  . Ramipril Cough  . Vancomycin Nausea Only    Past Medical History  Diagnosis Date  . Hearing loss   . Skin change   . Chronic atrial fibrillation   . Diabetes mellitus type II, controlled   . Hyperlipidemia   . Depression   . Ischemic heart disease     remote stenting of the RCA in 1998 with residual LAD disease of 60 to 70% with negative nuclear in 2011  . Pulmonary hypertension     per echo in 2011  . Diverticulitis   . H/O blood clots 1990's    in L leg (related to being in a cast for surgery)  . Thrombocytopenia   . Anal fissure   . Neck mass 10/19/2013  . MDS (myelodysplastic syndrome) 12/11/2013  . CHF (congestive heart failure)   . Myocardial infarction 1999  . DVT (deep venous thrombosis) ~ 2009    "LLE"  . Pneumonia ~ 11/2013    "tx'd; not really sure if he had it or not"  . OSA (obstructive sleep apnea)     BiPAP  . Hypothyroidism   . Anemia in chronic kidney disease(285.21) 11/13/2013  . GERD (gastroesophageal reflux disease)   . Arthritis     "joints" (04/21/2014)  . Spinal stenosis, lumbar   . Gout     "little bit in the left foot"  . Complete heart block  s/p single chamber PPM implantation April 2015 ([place])    Past Surgical History   Procedure Laterality Date  . Thyroidectomy  1960  . Wrist surgery Left ~ 2003    "cut the radial artery"  . Achilles tendon repair Left ~ 2002  . Shrapnel Left     "left shoulder; left hip" [place]   . Inguinal hernia repair Right 1980  . Cataract extraction w/ intraocular lens  implant, bilateral Bilateral ~ 2007  . Vasectomy    . Colonoscopy  2009    polyps in the past  . Insert / replace / remove pacemaker  04/21/2014    single chamber PPM ([place])  . Coronary angioplasty with stent placement  1999  . Tonsillectomy and adenoidectomy  1930's  . Pocket evacuation  05-11-2014    pocket hematoma evacuation by Dr Lovena Le    Family History  Problem Relation Age of Onset  . Heart failure Mother 90  . Diabetes Mother 76  . Heart attack Father 63  . Stroke Father 14  . Aortic aneurysm Father 71    ABDOMINAL  . Cancer Son     ?  Marland Kitchen Hepatitis Son   . Diabetes Brother     Social History:  reports that he quit smoking about 62 years ago. His smoking use included Cigarettes. He has a 2 pack-year smoking history. He has never used smokeless tobacco. He reports that he drinks about 4.2 ounces of alcohol per week. He reports that he does not use illicit drugs.  Review of Systems:  Hypothyroidism:  he has had long-standing hypothyroidism, no complaints of fatigue TSH levels have been very stable with 150 mcg of levothyroxine which he takes in the mornings daily  Lab Results  Component Value Date   FREET4 1.16 06/18/2014   FREET4 1.15 09/16/2013   FREET4 1.70 06/19/2006   TSH 1.74 10/20/2014   TSH 2.06 06/18/2014   TSH 2.50 09/16/2013    Lipids: These have been controlled with Crestor 5 mg , also takes fish oil  Lab Results  Component Value Date   CHOL 117 10/20/2014   HDL 47.70 10/20/2014   LDLCALC 67 10/20/2014   TRIG 11.0 10/20/2014   CHOLHDL 2 10/20/2014     He has had significant osteoarthritis of his hip limiting his activity    Myelodysplastic syndrome: He has  had Aranesp with only some improvement in his anemia, followed by oncologist   Lab Results  Component Value Date   WBC 5.4 10/13/2014   HGB 9.7* 10/13/2014   HCT 30.2* 10/13/2014   MCV 101.0* 10/13/2014   PLT 110* 10/13/2014      Examination:   BP 120/62  Pulse 60  Temp(Src) 98.3 F (36.8 C)  Resp 14  Ht 5\' 6"  (1.676 m)  Wt 160 lb (72.576 kg)  BMI 25.84 kg/m2  SpO2 97%  Body mass index is 25.84 kg/(m^2).    BP 136/68 on the right, less well heard on the left and appears lower    2 + ankle edema present  bilaterally; he is wearing elastic stockings  ASSESSMENT/ PLAN:   1. Diabetes type 2   Blood glucose control appears fairly good with upper normal A1c and fairly good readings at home  No hypoglycemia with low-dose glipizide.  He will continue the same dose and watch diet.   Control is adequate for his age and multiple medical problems  2. Hypothyroidism: Adequately replaced with 150 mcg dose  3. Chronic  kidney disease, likely to be from nephrosclerosis.creatinine has been stable around 2.0  4. Long-standing venous edema of legs. This is well-controlled and he is also using elastic stockings.  He can increase his Lasix as needed if edema is worse  5. Hypertension: His blood pressure is well-controlled  He has mild transient orthostatic dizziness which may be from doxazosin, currently taking only 2 mg  His systolic blood pressure is higher on the right side compared to the left  6. Anemia with thrombocytopenia still significant; now followed by hematologist. He is periodically getting Aranesp. Also on iron and B12   Numa Heatwole 10/25/2014, 2:13 PM

## 2014-11-12 ENCOUNTER — Other Ambulatory Visit: Payer: Self-pay | Admitting: Internal Medicine

## 2014-11-12 DIAGNOSIS — I4891 Unspecified atrial fibrillation: Secondary | ICD-10-CM

## 2014-11-16 ENCOUNTER — Other Ambulatory Visit: Payer: Self-pay | Admitting: Hematology and Oncology

## 2014-11-16 ENCOUNTER — Telehealth: Payer: Self-pay | Admitting: *Deleted

## 2014-11-16 ENCOUNTER — Ambulatory Visit (INDEPENDENT_AMBULATORY_CARE_PROVIDER_SITE_OTHER): Payer: Medicare Other

## 2014-11-16 DIAGNOSIS — Z5181 Encounter for therapeutic drug level monitoring: Secondary | ICD-10-CM

## 2014-11-16 DIAGNOSIS — R0602 Shortness of breath: Secondary | ICD-10-CM

## 2014-11-16 DIAGNOSIS — D469 Myelodysplastic syndrome, unspecified: Secondary | ICD-10-CM

## 2014-11-16 DIAGNOSIS — N183 Chronic kidney disease, stage 3 unspecified: Secondary | ICD-10-CM

## 2014-11-16 DIAGNOSIS — I4891 Unspecified atrial fibrillation: Secondary | ICD-10-CM

## 2014-11-16 LAB — POCT INR: INR: 1.6

## 2014-11-16 NOTE — Telephone Encounter (Signed)
Wife states she is concerned about pt.. He has lost 13 lbs in past 6 weeks and has low energy.

## 2014-11-16 NOTE — Telephone Encounter (Signed)
Lab appt tomorrow at 2 pm.  Wife is aware.  She states pt was 160 lbs late October and today at coumadin clinic he weighed 148 lbs.  She thinks his appetite is good and he is eating.  She said they decreased his lasix by half when they saw his weight loss today so she is not sure if it is mostly fluid loss, but she would feel better having his labs checked tomorrow.  She doesn't feel he needs to see Dietician because she thinks he is eating well and has a good appetite.

## 2014-11-16 NOTE — Telephone Encounter (Signed)
Looks like his last PCP visit weight was stable. 1) We can recheck labs here tomorrow 2) Get Dory Peru to see

## 2014-11-17 ENCOUNTER — Telehealth: Payer: Self-pay | Admitting: *Deleted

## 2014-11-17 ENCOUNTER — Other Ambulatory Visit (HOSPITAL_BASED_OUTPATIENT_CLINIC_OR_DEPARTMENT_OTHER): Payer: Medicare Other

## 2014-11-17 DIAGNOSIS — N183 Chronic kidney disease, stage 3 unspecified: Secondary | ICD-10-CM

## 2014-11-17 DIAGNOSIS — R0602 Shortness of breath: Secondary | ICD-10-CM

## 2014-11-17 DIAGNOSIS — D469 Myelodysplastic syndrome, unspecified: Secondary | ICD-10-CM

## 2014-11-17 LAB — COMPREHENSIVE METABOLIC PANEL (CC13)
ALT: 14 U/L (ref 0–55)
ANION GAP: 10 meq/L (ref 3–11)
AST: 17 U/L (ref 5–34)
Albumin: 3.6 g/dL (ref 3.5–5.0)
Alkaline Phosphatase: 126 U/L (ref 40–150)
BUN: 87.1 mg/dL — ABNORMAL HIGH (ref 7.0–26.0)
CO2: 23 mEq/L (ref 22–29)
CREATININE: 2 mg/dL — AB (ref 0.7–1.3)
Calcium: 9 mg/dL (ref 8.4–10.4)
Chloride: 106 mEq/L (ref 98–109)
GLUCOSE: 141 mg/dL — AB (ref 70–140)
Potassium: 4.5 mEq/L (ref 3.5–5.1)
Sodium: 139 mEq/L (ref 136–145)
Total Bilirubin: 0.8 mg/dL (ref 0.20–1.20)
Total Protein: 7.2 g/dL (ref 6.4–8.3)

## 2014-11-17 LAB — CBC WITH DIFFERENTIAL/PLATELET
BASO%: 0.7 % (ref 0.0–2.0)
Basophils Absolute: 0 10*3/uL (ref 0.0–0.1)
EOS ABS: 0.3 10*3/uL (ref 0.0–0.5)
EOS%: 4.7 % (ref 0.0–7.0)
HEMATOCRIT: 31.3 % — AB (ref 38.4–49.9)
HGB: 10.3 g/dL — ABNORMAL LOW (ref 13.0–17.1)
LYMPH%: 15.7 % (ref 14.0–49.0)
MCH: 32.8 pg (ref 27.2–33.4)
MCHC: 32.9 g/dL (ref 32.0–36.0)
MCV: 99.7 fL — ABNORMAL HIGH (ref 79.3–98.0)
MONO#: 0.8 10*3/uL (ref 0.1–0.9)
MONO%: 14 % (ref 0.0–14.0)
NEUT%: 64.9 % (ref 39.0–75.0)
NEUTROS ABS: 3.6 10*3/uL (ref 1.5–6.5)
PLATELETS: 142 10*3/uL (ref 140–400)
RBC: 3.14 10*6/uL — ABNORMAL LOW (ref 4.20–5.82)
RDW: 15.2 % — ABNORMAL HIGH (ref 11.0–14.6)
WBC: 5.5 10*3/uL (ref 4.0–10.3)
lymph#: 0.9 10*3/uL (ref 0.9–3.3)

## 2014-11-17 NOTE — Telephone Encounter (Signed)
Left message with note below 

## 2014-11-17 NOTE — Telephone Encounter (Signed)
-----   Message from Heath Lark, MD sent at 11/17/2014  3:44 PM EST ----- Regarding: Labs Labs are stable. Please call wife and let her know.  ----- Message -----    From: [facility]    Sent: 11/17/2014   2:44 PM      To: Heath Lark, MD

## 2014-11-18 LAB — FERRITIN CHCC: Ferritin: 213 ng/ml (ref 22–316)

## 2014-11-18 LAB — IRON AND TIBC CHCC
%SAT: 37 % (ref 20–55)
Iron: 87 ug/dL (ref 42–163)
TIBC: 234 ug/dL (ref 202–409)
UIBC: 146 ug/dL (ref 117–376)

## 2014-11-19 ENCOUNTER — Other Ambulatory Visit: Payer: Self-pay | Admitting: *Deleted

## 2014-11-19 ENCOUNTER — Telehealth: Payer: Self-pay | Admitting: Endocrinology

## 2014-11-19 LAB — ERYTHROPOIETIN: ERYTHROPOIETIN: 7.7 m[IU]/mL (ref 2.6–18.5)

## 2014-11-19 LAB — VITAMIN B12: Vitamin B-12: 1174 pg/mL — ABNORMAL HIGH (ref 211–911)

## 2014-11-19 NOTE — Telephone Encounter (Signed)
He needs to discuss weight loss with his primary care physician, cancer center is only seeing him for anemia

## 2014-11-19 NOTE — Telephone Encounter (Signed)
Since visit he has lost 13 lbs can we look at the records from the cancer center and see if there is anything we could do?

## 2014-11-19 NOTE — Telephone Encounter (Signed)
Please see below and advise.

## 2014-11-24 ENCOUNTER — Other Ambulatory Visit: Payer: Self-pay | Admitting: Endocrinology

## 2014-11-29 ENCOUNTER — Telehealth: Payer: Self-pay | Admitting: Cardiology

## 2014-11-29 ENCOUNTER — Encounter: Payer: Medicare Other | Admitting: *Deleted

## 2014-11-29 NOTE — Telephone Encounter (Signed)
Confirmed remote transmission w/ pt wife.   

## 2014-12-03 ENCOUNTER — Encounter: Payer: Self-pay | Admitting: Cardiology

## 2014-12-07 ENCOUNTER — Other Ambulatory Visit: Payer: Self-pay | Admitting: Hematology and Oncology

## 2014-12-07 DIAGNOSIS — D469 Myelodysplastic syndrome, unspecified: Secondary | ICD-10-CM

## 2014-12-08 ENCOUNTER — Other Ambulatory Visit (HOSPITAL_BASED_OUTPATIENT_CLINIC_OR_DEPARTMENT_OTHER): Payer: Medicare Other

## 2014-12-08 ENCOUNTER — Ambulatory Visit (HOSPITAL_BASED_OUTPATIENT_CLINIC_OR_DEPARTMENT_OTHER): Payer: Medicare Other

## 2014-12-08 DIAGNOSIS — D630 Anemia in neoplastic disease: Secondary | ICD-10-CM

## 2014-12-08 DIAGNOSIS — D469 Myelodysplastic syndrome, unspecified: Secondary | ICD-10-CM

## 2014-12-08 LAB — CBC WITH DIFFERENTIAL/PLATELET
BASO%: 1.1 % (ref 0.0–2.0)
Basophils Absolute: 0.1 10*3/uL (ref 0.0–0.1)
EOS%: 6.2 % (ref 0.0–7.0)
Eosinophils Absolute: 0.4 10*3/uL (ref 0.0–0.5)
HCT: 28.8 % — ABNORMAL LOW (ref 38.4–49.9)
HEMOGLOBIN: 9.2 g/dL — AB (ref 13.0–17.1)
LYMPH%: 11.7 % — ABNORMAL LOW (ref 14.0–49.0)
MCH: 32.2 pg (ref 27.2–33.4)
MCHC: 31.9 g/dL — ABNORMAL LOW (ref 32.0–36.0)
MCV: 101.1 fL — AB (ref 79.3–98.0)
MONO#: 0.8 10*3/uL (ref 0.1–0.9)
MONO%: 13.5 % (ref 0.0–14.0)
NEUT#: 3.8 10*3/uL (ref 1.5–6.5)
NEUT%: 67.5 % (ref 39.0–75.0)
Platelets: 138 10*3/uL — ABNORMAL LOW (ref 140–400)
RBC: 2.85 10*6/uL — AB (ref 4.20–5.82)
RDW: 15.5 % — AB (ref 11.0–14.6)
WBC: 5.7 10*3/uL (ref 4.0–10.3)
lymph#: 0.7 10*3/uL — ABNORMAL LOW (ref 0.9–3.3)

## 2014-12-08 MED ORDER — DARBEPOETIN ALFA 300 MCG/0.6ML IJ SOSY
300.0000 ug | PREFILLED_SYRINGE | Freq: Once | INTRAMUSCULAR | Status: AC
  Administered 2014-12-08: 300 ug via SUBCUTANEOUS
  Filled 2014-12-08: qty 0.6

## 2014-12-08 NOTE — Patient Instructions (Signed)
Darbepoetin Alfa injection What is this medicine? DARBEPOETIN ALFA (dar be POE e tin AL fa) helps your body make more red blood cells. It is used to treat anemia caused by chronic kidney failure and chemotherapy. This medicine may be used for other purposes; ask your health care provider or pharmacist if you have questions. COMMON BRAND NAME(S): Aranesp What should I tell my health care provider before I take this medicine? They need to know if you have any of these conditions: -blood clotting disorders or history of blood clots -cancer patient not on chemotherapy -cystic fibrosis -heart disease, such as angina, heart failure, or a history of a heart attack -hemoglobin level of 12 g/dL or greater -high blood pressure -low levels of folate, iron, or vitamin B12 -seizures -an unusual or allergic reaction to darbepoetin, erythropoietin, albumin, hamster proteins, latex, other medicines, foods, dyes, or preservatives -pregnant or trying to get pregnant -breast-feeding How should I use this medicine? This medicine is for injection into a vein or under the skin. It is usually given by a health care professional in a hospital or clinic setting. If you get this medicine at home, you will be taught how to prepare and give this medicine. Do not shake the solution before you withdraw a dose. Use exactly as directed. Take your medicine at regular intervals. Do not take your medicine more often than directed. It is important that you put your used needles and syringes in a special sharps container. Do not put them in a trash can. If you do not have a sharps container, call your pharmacist or healthcare provider to get one. Talk to your pediatrician regarding the use of this medicine in children. While this medicine may be used in children as young as 1 year for selected conditions, precautions do apply. Overdosage: If you think you have taken too much of this medicine contact a poison control center or  emergency room at once. NOTE: This medicine is only for you. Do not share this medicine with others. What if I miss a dose? If you miss a dose, take it as soon as you can. If it is almost time for your next dose, take only that dose. Do not take double or extra doses. What may interact with this medicine? Do not take this medicine with any of the following medications: -epoetin alfa This list may not describe all possible interactions. Give your health care provider a list of all the medicines, herbs, non-prescription drugs, or dietary supplements you use. Also tell them if you smoke, drink alcohol, or use illegal drugs. Some items may interact with your medicine. What should I watch for while using this medicine? Visit your prescriber or health care professional for regular checks on your progress and for the needed blood tests and blood pressure measurements. It is especially important for the doctor to make sure your hemoglobin level is in the desired range, to limit the risk of potential side effects and to give you the best benefit. Keep all appointments for any recommended tests. Check your blood pressure as directed. Ask your doctor what your blood pressure should be and when you should contact him or her. As your body makes more red blood cells, you may need to take iron, folic acid, or vitamin B supplements. Ask your doctor or health care provider which products are right for you. If you have kidney disease continue dietary restrictions, even though this medication can make you feel better. Talk with your doctor or health   care professional about the foods you eat and the vitamins that you take. What side effects may I notice from receiving this medicine? Side effects that you should report to your doctor or health care professional as soon as possible: -allergic reactions like skin rash, itching or hives, swelling of the face, lips, or tongue -breathing problems -changes in vision -chest  pain -confusion, trouble speaking or understanding -feeling faint or lightheaded, falls -high blood pressure -muscle aches or pains -pain, swelling, warmth in the leg -rapid weight gain -severe headaches -sudden numbness or weakness of the face, arm or leg -trouble walking, dizziness, loss of balance or coordination -seizures (convulsions) -swelling of the ankles, feet, hands -unusually weak or tired Side effects that usually do not require medical attention (report to your doctor or health care professional if they continue or are bothersome): -diarrhea -fever, chills (flu-like symptoms) -headaches -nausea, vomiting -redness, stinging, or swelling at site where injected This list may not describe all possible side effects. Call your doctor for medical advice about side effects. You may report side effects to FDA at 1-800-FDA-1088. Where should I keep my medicine? Keep out of the reach of children. Store in a refrigerator between 2 and 8 degrees C (36 and 46 degrees F). Do not freeze. Do not shake. Throw away any unused portion if using a single-dose vial. Throw away any unused medicine after the expiration date. NOTE: This sheet is a summary. It may not cover all possible information. If you have questions about this medicine, talk to your doctor, pharmacist, or health care provider.  2015, [employer]vier/[employer] Standard. (2008-11-30 10:23:57)  

## 2014-12-09 ENCOUNTER — Encounter (HOSPITAL_COMMUNITY): Payer: Self-pay | Admitting: Internal Medicine

## 2014-12-12 ENCOUNTER — Other Ambulatory Visit: Payer: Self-pay | Admitting: Endocrinology

## 2014-12-21 ENCOUNTER — Ambulatory Visit (INDEPENDENT_AMBULATORY_CARE_PROVIDER_SITE_OTHER): Payer: Medicare Other | Admitting: Family Medicine

## 2014-12-21 DIAGNOSIS — Z5181 Encounter for therapeutic drug level monitoring: Secondary | ICD-10-CM

## 2014-12-21 DIAGNOSIS — I4891 Unspecified atrial fibrillation: Secondary | ICD-10-CM

## 2014-12-21 LAB — POCT INR: INR: 1.7

## 2014-12-27 ENCOUNTER — Telehealth: Payer: Self-pay | Admitting: Internal Medicine

## 2014-12-27 ENCOUNTER — Encounter: Payer: Self-pay | Admitting: Internal Medicine

## 2014-12-27 ENCOUNTER — Ambulatory Visit (INDEPENDENT_AMBULATORY_CARE_PROVIDER_SITE_OTHER): Payer: Medicare Other | Admitting: *Deleted

## 2014-12-27 DIAGNOSIS — I442 Atrioventricular block, complete: Secondary | ICD-10-CM

## 2014-12-27 LAB — MDC_IDC_ENUM_SESS_TYPE_REMOTE
Implantable Pulse Generator Model: 1160
Implantable Pulse Generator Serial Number: 7505109

## 2014-12-27 NOTE — Telephone Encounter (Signed)
Informed pt wife that transmission was received.  

## 2014-12-27 NOTE — Progress Notes (Signed)
Remote pacemaker transmission.   

## 2014-12-27 NOTE — Telephone Encounter (Signed)
New message        Pt would like to know if transmission went through

## 2014-12-28 ENCOUNTER — Encounter: Payer: Self-pay | Admitting: *Deleted

## 2014-12-28 LAB — HM DIABETES EYE EXAM

## 2015-01-04 ENCOUNTER — Encounter: Payer: Self-pay | Admitting: Cardiology

## 2015-01-06 ENCOUNTER — Other Ambulatory Visit: Payer: Self-pay | Admitting: Endocrinology

## 2015-01-16 ENCOUNTER — Other Ambulatory Visit: Payer: Self-pay | Admitting: Endocrinology

## 2015-01-18 ENCOUNTER — Encounter: Payer: Self-pay | Admitting: Cardiology

## 2015-01-25 ENCOUNTER — Ambulatory Visit (INDEPENDENT_AMBULATORY_CARE_PROVIDER_SITE_OTHER): Payer: 59 | Admitting: Cardiology

## 2015-01-25 ENCOUNTER — Encounter: Payer: Self-pay | Admitting: Cardiology

## 2015-01-25 VITALS — BP 140/60 | HR 60 | Ht 66.0 in | Wt 151.0 lb

## 2015-01-25 DIAGNOSIS — I442 Atrioventricular block, complete: Secondary | ICD-10-CM

## 2015-01-25 NOTE — Progress Notes (Signed)
HPI The patient presents for follow of atrial fib and CAD and pulmonary HTN which we are treating conservatively. Since I last saw him he has had treatment with CPAP titration and has done well with this. He did have syncope and underwent permanent pacemaker placement which was complicated by a hematoma. He required pocket revision.  He denies any acute shortness of breath, PND or orthopnea. He's had no palpitations, presyncope or syncope.  He uses a seat that most of the night. He gets around with a walker. He's not describing any new PND or orthopnea.   Allergies  Allergen Reactions  . Keflex [Cephalexin] Other (See Comments)  . Codeine Nausea Only  . Ramipril Cough  . Vancomycin Nausea Only    Current Outpatient Prescriptions  Medication Sig Dispense Refill  . ACCU-CHEK AVIVA PLUS test strip USE ONCE DAILY AS DIRECTED 50 each 5  . acetaminophen (TYLENOL) 500 MG tablet Take 1,000 mg by mouth every 8 (eight) hours as needed for moderate pain or headache.     . Calcium Carbonate-Vit D-Min (CALCIUM 1200 PO) Take 1,200 mg by mouth daily.     . Cholecalciferol (VITAMIN D) 1000 UNITS capsule Take 1,000 Units by mouth daily.     . clotrimazole-betamethasone (LOTRISONE) cream Apply 1 application topically daily.    Marland Kitchen docusate sodium (COLACE) 100 MG capsule Take 100 mg by mouth 2 (two) times daily.    Marland Kitchen doxazosin (CARDURA) 2 MG tablet TAKE 1 TABLET BY MOUTH EVERY DAY 30 tablet 4  . fish oil-omega-3 fatty acids 1000 MG capsule Take 1 g by mouth daily.    . furosemide (LASIX) 80 MG tablet Take 1 tablet (80 mg total) by mouth daily. 30 tablet 5  . glipiZIDE (GLUCOTROL XL) 2.5 MG 24 hr tablet TAKE 1 TABLET BY MOUTH EVERY DAY 30 tablet 3  . levothyroxine (SYNTHROID, LEVOTHROID) 150 MCG tablet TAKE 1 TABLET BY MOUTH EVERY MORNING ON AN EMPTY STOMACH 30 tablet 5  . losartan (COZAAR) 50 MG tablet TAKE 1 TABLET BY MOUTH EVERY DAY 30 tablet 3  . Magnesium 300 MG CAPS Take 300 mg by mouth.    . Misc  Natural Products (OSTEO BI-FLEX JOINT SHIELD PO) Take 1 tablet by mouth 2 (two) times daily.     Marland Kitchen oxybutynin (DITROPAN) 5 MG tablet Take 5 mg by mouth every 6 (six) hours as needed for bladder spasms.     . polyethylene glycol (MIRALAX / GLYCOLAX) packet Take 17 g by mouth daily.    Marland Kitchen tobramycin (TOBREX) 0.3 % ophthalmic solution Place 1 drop into both eyes 2 (two) times daily.    . vitamin B-12 (CYANOCOBALAMIN) 1000 MCG tablet Take 1,000 mcg by mouth daily.    . vitamin C (ASCORBIC ACID) 500 MG tablet Take 500 mg by mouth daily.    Marland Kitchen warfarin (COUMADIN) 2.5 MG tablet TAKE AS DIRECTED BY ANTICOAGULATION CLINIC 35 tablet 2   No current facility-administered medications for this visit.    Past Medical History  Diagnosis Date  . Hearing loss   . Skin change   . Chronic atrial fibrillation   . Diabetes mellitus type II, controlled   . Hyperlipidemia   . Depression   . Ischemic heart disease     remote stenting of the RCA in 1998 with residual LAD disease of 60 to 70% with negative nuclear in 2011  . Pulmonary hypertension     per echo in 2011  . Diverticulitis   . H/O blood  clots 1990's    in L leg (related to being in a cast for surgery)  . Thrombocytopenia   . Anal fissure   . Neck mass 10/19/2013  . MDS (myelodysplastic syndrome) 12/11/2013  . CHF (congestive heart failure)   . Myocardial infarction 1999  . DVT (deep venous thrombosis) ~ 2009    "LLE"  . Pneumonia ~ 11/2013    "tx'd; not really sure if he had it or not"  . OSA (obstructive sleep apnea)     BiPAP  . Hypothyroidism   . Anemia in chronic kidney disease(285.21) 11/13/2013  . GERD (gastroesophageal reflux disease)   . Arthritis     "joints" (04/21/2014)  . Spinal stenosis, lumbar   . Gout     "little bit in the left foot"  . Complete heart block     s/p single chamber PPM implantation April 2015 ([place])    Past Surgical History  Procedure Laterality Date  . Thyroidectomy  1960  . Wrist surgery  Left ~ 2003    "cut the radial artery"  . Achilles tendon repair Left ~ 2002  . Shrapnel Left     "left shoulder; left hip" [place]   . Inguinal hernia repair Right 1980  . Cataract extraction w/ intraocular lens  implant, bilateral Bilateral ~ 2007  . Vasectomy    . Colonoscopy  2009    polyps in the past  . Insert / replace / remove pacemaker  04/21/2014    single chamber PPM ([place])  . Coronary angioplasty with stent placement  1999  . Tonsillectomy and adenoidectomy  1930's  . Pocket evacuation  05-11-2014    pocket hematoma evacuation by Dr Lovena Le  . Permanent pacemaker insertion N/A 04/21/2014    Procedure: PERMANENT PACEMAKER INSERTION;  Surgeon: Evans Lance, MD;  Location[facility] C[facility] LAB;  Service: Cardiovascular;  Laterality: N/A;  . Pocket revision N/A 05/11/2014    Procedure: POCKET REVISION/HEMATOMA REMOVAL;  Surgeon: Evans Lance, MD;  Location: [facility] CATH LAB;  Service: Cardiovascular;  Laterality: N/A;    ROS:  As stated in the HPI and negative for all other systems.  PHYSICAL EXAM BP 140/60 mmHg  Pulse 60  Ht 5\' 6"  (1.676 m)  Wt 151 lb (68.493 kg)  BMI 24.38 kg/m2 PHYSICAL EXAM GEN:  No distress, very frail NECK:  Positive jugular venous distention, waveform within normal limits, carotid upstroke brisk and symmetric, no bruits, no thyromegaly LUNGS:  Clear to auscultation bilaterally BACK:  No CVA tenderness CHEST:  Unremarkable HEART:  S1 and S2 within normal limits, no S3,  no clicks, no rubs, right lower sternal border holosystolic murmur ABD:  Positive bowel sounds normal in frequency in pitch, no rebound, no guarding,  positive midline bruit EXT:  2 plus pulses throughout, moderate bilateral lower extremity  edema,  NEURO:  Cranial nerves II through XII grossly intact, motor grossly intact throughout PSYCH:  Cognitively intact, oriented to person place and time, he seems to be slightly confused to recent fax however.  EKG:  Ventricular pacing rate 60,  probable underlying atrial fibrillation.   01/25/2015  ASSESSMENT AND PLAN  Atrial fibrillation -   [name] has a CHA2DS2 - VASc score of 5 with a risk of stroke of 6.7%  and a HAS - BLED score of 3.with one validation study suggesting a high risk of bleeding.  He is doing well with anticoagulation and he will continue this therapy.  Enlarged RV (  right ventricle) -  This is being managed symptomatically. He has had great improvement in symptoms since starting CPAP and he will continue to have this titrated by Dr. Lamonte Sakai  Ischemic heart disease -  We aren't managing this conservatively. He has no acute active symptoms.  Edema -  He does have some swelling but this seems to be much improved. He will continue the meds as listed.  Bradycardia - He is now status post pacemaker placement. No change in therapy is indicated.

## 2015-01-25 NOTE — Patient Instructions (Signed)
Your physician recommends that you schedule a follow-up appointment in: 6 months with Remer Macho

## 2015-01-31 ENCOUNTER — Ambulatory Visit (INDEPENDENT_AMBULATORY_CARE_PROVIDER_SITE_OTHER)
Admission: RE | Admit: 2015-01-31 | Discharge: 2015-01-31 | Disposition: A | Discharge status: Home / Self Care | Payer: Medicare Other | Source: Ambulatory Visit | Attending: Adult Health | Admitting: Adult Health

## 2015-01-31 ENCOUNTER — Ambulatory Visit (INDEPENDENT_AMBULATORY_CARE_PROVIDER_SITE_OTHER): Payer: Medicare Other | Admitting: Adult Health

## 2015-01-31 ENCOUNTER — Telehealth: Payer: Self-pay | Admitting: Emergency Medicine

## 2015-01-31 ENCOUNTER — Other Ambulatory Visit (INDEPENDENT_AMBULATORY_CARE_PROVIDER_SITE_OTHER): Payer: Medicare Other

## 2015-01-31 ENCOUNTER — Encounter: Payer: Self-pay | Admitting: Adult Health

## 2015-01-31 VITALS — BP 114/66 | HR 88 | Temp 97.7°F | Ht 66.0 in | Wt 155.6 lb

## 2015-01-31 DIAGNOSIS — L03116 Cellulitis of left lower limb: Secondary | ICD-10-CM

## 2015-01-31 DIAGNOSIS — Z5181 Encounter for therapeutic drug level monitoring: Secondary | ICD-10-CM

## 2015-01-31 DIAGNOSIS — I482 Chronic atrial fibrillation, unspecified: Secondary | ICD-10-CM

## 2015-01-31 DIAGNOSIS — J209 Acute bronchitis, unspecified: Secondary | ICD-10-CM

## 2015-01-31 DIAGNOSIS — R0602 Shortness of breath: Secondary | ICD-10-CM

## 2015-01-31 DIAGNOSIS — R609 Edema, unspecified: Secondary | ICD-10-CM

## 2015-01-31 LAB — BASIC METABOLIC PANEL
BUN: 85 mg/dL (ref 6–23)
CALCIUM: 8.8 mg/dL (ref 8.4–10.5)
CHLORIDE: 108 meq/L (ref 96–112)
CO2: 22 meq/L (ref 19–32)
Creatinine, Ser: 1.98 mg/dL — ABNORMAL HIGH (ref 0.40–1.50)
GFR: 34.26 mL/min — ABNORMAL LOW (ref >60.00)
GLUCOSE: 142 mg/dL — AB (ref 70–99)
POTASSIUM: 4.7 meq/L (ref 3.5–5.1)
Sodium: 137 mEq/L (ref 135–145)

## 2015-01-31 LAB — PROTIME-INR
INR: 2.3 ratio — ABNORMAL HIGH (ref 0.8–1.0)
Prothrombin Time: 24.6 s — ABNORMAL HIGH (ref 9.6–13.1)

## 2015-01-31 LAB — BRAIN NATRIURETIC PEPTIDE: Pro B Natriuretic peptide (BNP): 223 pg/mL — ABNORMAL HIGH (ref 0.0–100.0)

## 2015-01-31 MED ORDER — AMOXICILLIN-POT CLAVULANATE 875-125 MG PO TABS: 1.0000 | ORAL_TABLET | Freq: Two times a day (BID) | ORAL | Status: AC

## 2015-01-31 NOTE — Assessment & Plan Note (Addendum)
URI vs bronchitis  Will check CXR and labs with BNP Prone to fluid overload -on Lasix 80mg  daily  Caution increase in diuretic as has chronic kidney dz with last BUN 87   Plan  Augmentin 875mg  Twice daily  For 7 days  Mucinex DM Twice daily  As needed  Cough/congestion.  Chest xray today  Extra Lasix 40mg  today x 1  Keep legs elevated . Warm compresses to leg.  IF leg redness worsens will need to see PCP  Follow up Dr. Lamonte Sakai in 3-4 weeks and As needed   Please contact office for sooner follow up if symptoms do not improve or worsen or seek emergency care

## 2015-01-31 NOTE — Telephone Encounter (Signed)
Pt has been scheduled to see TP today 12pm. Nothing further was needed.

## 2015-01-31 NOTE — Assessment & Plan Note (Signed)
LE leg swelling/redness -appears to be venous insufficiency w/ stasis dermatitis vs early cellulitis Advised if not Improving will need ov with PCP   Plan  Augmentin 875mg  Twice daily  For 7 days  Mucinex DM Twice daily  As needed  Cough/congestion.  Chest xray today  Extra Lasix 40mg  today x 1  Keep legs elevated . Warm compresses to leg.  IF leg redness worsens will need to see PCP  Follow up Dr. Lamonte Sakai in 3-4 weeks and As needed   Please contact office for sooner follow up if symptoms do not improve or worsen or seek emergency care

## 2015-01-31 NOTE — Progress Notes (Signed)
Subjective:    Patient ID: Douglas George, male    DOB: [birth year], 81 y.o.   MRN: [id number]  HPI 81 yo former low exposure smoker (2 pk-yrs), hx DM, OSA (dx 15 yrs ago, not on CPAP),  LE DVT (his wife says these were while he was therapeutic on coumadin), HTN with restrictive diastolic dysfxn, CAD (RCA stent '98, LAD dz), moderate AI and MR, RV dilation and PAH by TTE in 2011 (estimated PASP 54mmHg) and then confirmed on TTE January 2/14 (estimated PASP 86 mmHg). Followed by Dr Percival Spanish for these issues and A fib on coumadin.  His most recent TTE was performed to evaluate LE edema, has improved since lasix was increased 1/16. He denies any dyspnea, although activity is limited by his joint disease.   ROV 03/25/13 -- follows up for secondary PAH in setting untreated OSA, chronic VTE, L sided heart disease (CAD, diastolic dysfxn, valvular disease). He underwent full PFT today >> Mild (but real) AFL without BD response, normal volumes, decrease DLCO. Last visit dsat to 90% after 2 laps on RA, no overt drop to <88. Sleep study done but not yet available.  V/Q read as normal on 03/02/13 (has been on anti-coagulation).  Auto-immune panel is negative (2/25).  He is wondering about whether he should get the R heart cath.   ROV 05/15/13 -- Follows for his secondary PAH with w/u and eval as above. He has had his CPAP titration study, results not yet available. Since last time he unfortunately was admitted in [facility] for UTI and heat exhaustion.   ROV 07/1913 -- secondary [name] identified on TTE last done 2/14. He underwent PSG, has started auto-set BiPAP (5-25), . Returns for follow up. Compliance data is available > shows that he is using > 4 hours 73 % of the time, used it every night. Data recommends starting with 16/12 but there was significant leakage. (SLE 1006 is mask fitting). He feels much better since starting the NIPPV. Less exertional SOB, no longer napping.   ROV 12/07/13 -- secondary PAH, OSA on BiPAP with  moderate compliance. Was seen last month by Dr Gwenette Greet with persistent UA cough. ? The inciting event, was treated w levaquin, seemed to be driven by his ramipril. His breathing is better, has improved since his diuretics were increased at Cardiology.    ROV 04/13/14 -- secondary PAH, OSA on BiPAP. Returns for f/u. Reports that he has been wearing biPAP reliably. He has been having allergies, itchy eyes, more mucous, scratchy throat. He is more SOB last 2 -3 days. He is scheduled for a pacer for A Fib with periods of brady. Suspect this contributes to his PAH. He has B LE edema for the last 3-4 days.    ROV 09/20/14 -- secondary PAH, OSA on BiPAP. He reports that his breathing has been doing fairly well - able to get around. He has good compliance with his BiPAP - uses between 4-7 hours most nights. He was briefly on [facility], but is now off of it so he will need to be back on [name] for BiPAP supplies.    01/31/2015 Acute OV -Secondary PAH, OSA on BiPAP RB pt here for increased SOB, upper wheezing/tightness, hoarsess, increased throat clearing with yellow/spotted with dark red blood for few days . Coughing up very thick mucus. Has a lot of drainage in throat. Gums bleed on occasion,.  Saw mucus was mixed with blood a couple of times.  Denies any f/c/s, n/v/d, PND Has chronic  leg swelling, L>R. Noticed some redness along the left mid LE.  Wife marked the area but has not seen any increase in size.  No drainage or known injury.  On coumadin for previous DVT , atrial Fib , last INR 1.7 in Dec.  Pt followed at oncology/hematolgy for MDS .    Review of Systems   Constitutional:   No  weight loss, night sweats,  Fevers, chills,  +fatigue, or  lassitude.  HEENT:   No headaches,  Difficulty swallowing,  Tooth/dental problems, or  Sore throat,                No sneezing, itching, ear ache, nasal congestion, post nasal drip,   CV:  No chest pain,  Orthopnea, PND  anasarca, dizziness, palpitations, syncope.    GI  No heartburn, indigestion, abdominal pain, nausea, vomiting, diarrhea, change in bowel habits, loss of appetite, bloody stools.   Resp:   No chest wall deformity  Skin: no rash or lesions.  GU: no dysuria, change in color of urine, no urgency or frequency.  No flank pain, no hematuria   MS:  No joint pain or swelling.  No decreased range of motion.  No back pain.  Psych:  No change in mood or affect.            Objective:   Physical Exam .GEN: A/Ox3; pleasant ,  HEENT:  [place]/AT,  EACs-clear, TMs-wnl, NOSE-clear, THROAT-clear, no lesions, no postnasal drip or exudate noted.   NECK:  Supple w/ fair ROM; no JVD; normal carotid impulses w/o bruits; no thyromegaly or nodules palpated; no lymphadenopathy.  RESP  Decreased BS in bases , no accessory muscle use, no dullness to percussion  CARD:  RRR, no m/r/g  , 1+ peripheral edema L>R , pulses intact, no cyanosis or clubbing.  GI:   Soft & nt; nml bowel sounds; no organomegaly or masses detected.  Musco: Warm bil, no deformities or joint swelling noted.   Neuro: alert, no focal deficits noted.    Skin: Warm, mild redness along mid LE on left, no red streaking, warm to touch       Assessment & Plan:

## 2015-01-31 NOTE — Patient Instructions (Addendum)
Augmentin 875mg  Twice daily  For 7 days  Mucinex DM Twice daily  As needed  Cough/congestion.  Chest xray today  Extra Lasix 40mg  today x 1  Keep legs elevated . Warm compresses to leg.  IF leg redness worsens will need to see PCP  Follow up Dr. Lamonte Sakai in 3-4 weeks and As needed   Please contact office for sooner follow up if symptoms do not improve or worsen or seek emergency care     Late Add  Set up for venous doppler

## 2015-01-31 NOTE — Assessment & Plan Note (Signed)
Check INR 

## 2015-02-01 ENCOUNTER — Other Ambulatory Visit (HOSPITAL_COMMUNITY): Payer: Self-pay | Admitting: Cardiology

## 2015-02-01 ENCOUNTER — Telehealth: Payer: Self-pay | Admitting: Nurse Practitioner

## 2015-02-01 ENCOUNTER — Other Ambulatory Visit: Payer: Self-pay | Admitting: Adult Health

## 2015-02-01 ENCOUNTER — Telehealth: Payer: Self-pay | Admitting: Adult Health

## 2015-02-01 ENCOUNTER — Ambulatory Visit (HOSPITAL_COMMUNITY): Payer: Medicare Other | Attending: Cardiology | Admitting: Cardiology

## 2015-02-01 ENCOUNTER — Encounter: Payer: Self-pay | Admitting: Emergency Medicine

## 2015-02-01 DIAGNOSIS — I10 Essential (primary) hypertension: Secondary | ICD-10-CM | POA: Diagnosis not present

## 2015-02-01 DIAGNOSIS — I82402 Acute embolism and thrombosis of unspecified deep veins of left lower extremity: Secondary | ICD-10-CM

## 2015-02-01 DIAGNOSIS — R609 Edema, unspecified: Secondary | ICD-10-CM

## 2015-02-01 DIAGNOSIS — E785 Hyperlipidemia, unspecified: Secondary | ICD-10-CM | POA: Diagnosis not present

## 2015-02-01 DIAGNOSIS — I2510 Atherosclerotic heart disease of native coronary artery without angina pectoris: Secondary | ICD-10-CM | POA: Insufficient documentation

## 2015-02-01 DIAGNOSIS — E119 Type 2 diabetes mellitus without complications: Secondary | ICD-10-CM | POA: Insufficient documentation

## 2015-02-01 NOTE — Telephone Encounter (Signed)
Call report-venous Doppler positive for left lower extremity small  DVT. INR 2.3 on Coumadin Patient is on Aranesp, which can increase the risk of clots. Recommended to hold for now and discuss with  hematology. Patient has been set up for a VQ scan.par Patient and wife aware patient is improved today with decreased cough and shortness of breath Denies any chest pain or hemoptysis. Please contact office for sooner follow up if symptoms do not improve or worsen or seek emergency care

## 2015-02-01 NOTE — Telephone Encounter (Signed)
Spoke with pt wife, pulmonary is asking if losartan or doxazosin can be stopped based on the patient bmp results from yesterday. The patients bp is running 114/70 and he is having no dizziness. They are telling the family they hope stopping one of these medications will help improve his kidney finction. Will forward for dr hochrein's review

## 2015-02-01 NOTE — Addendum Note (Signed)
Addended by: Len Blalock on: 02/01/2015 05:23 PM   Modules accepted: Orders

## 2015-02-01 NOTE — Addendum Note (Signed)
Addended by: Len Blalock on: 02/01/2015 12:09 PM   Modules accepted: Orders

## 2015-02-01 NOTE — Progress Notes (Signed)
Bilateral lower venous duplex performed   Results given to Rexene Edison, NP.

## 2015-02-01 NOTE — Telephone Encounter (Signed)
New problem    Pt's wife calling..  Pt saw his pulmonologist yesterday and he want to know if one of the bp medications can be stop, b/c it's drying out his kidneys.

## 2015-02-01 NOTE — Addendum Note (Signed)
Addended by: Parke Poisson E on: 02/01/2015 05:13 PM   Modules accepted: Orders

## 2015-02-02 ENCOUNTER — Ambulatory Visit (HOSPITAL_BASED_OUTPATIENT_CLINIC_OR_DEPARTMENT_OTHER): Payer: Medicare Other

## 2015-02-02 ENCOUNTER — Ambulatory Visit (HOSPITAL_COMMUNITY): Payer: Medicare Other

## 2015-02-02 ENCOUNTER — Ambulatory Visit: Payer: Medicare Other | Admitting: Pharmacist

## 2015-02-02 ENCOUNTER — Telehealth: Payer: Self-pay | Admitting: Hematology and Oncology

## 2015-02-02 ENCOUNTER — Other Ambulatory Visit (HOSPITAL_BASED_OUTPATIENT_CLINIC_OR_DEPARTMENT_OTHER): Payer: Medicare Other

## 2015-02-02 ENCOUNTER — Telehealth: Payer: Self-pay | Admitting: *Deleted

## 2015-02-02 ENCOUNTER — Ambulatory Visit (HOSPITAL_COMMUNITY)
Admission: RE | Admit: 2015-02-02 | Discharge: 2015-02-02 | Disposition: A | Discharge status: Home / Self Care | Payer: Medicare Other | Source: Ambulatory Visit | Attending: Adult Health | Admitting: Adult Health

## 2015-02-02 ENCOUNTER — Ambulatory Visit (HOSPITAL_BASED_OUTPATIENT_CLINIC_OR_DEPARTMENT_OTHER): Payer: Medicare Other | Admitting: Hematology and Oncology

## 2015-02-02 ENCOUNTER — Ambulatory Visit: Payer: Medicare Other | Admitting: Hematology and Oncology

## 2015-02-02 ENCOUNTER — Encounter (HOSPITAL_COMMUNITY): Payer: Medicare Other

## 2015-02-02 ENCOUNTER — Encounter: Payer: Self-pay | Admitting: Hematology and Oncology

## 2015-02-02 VITALS — BP 119/43 | HR 59 | Temp 98.2°F | Resp 18 | Ht 66.0 in | Wt 155.1 lb

## 2015-02-02 DIAGNOSIS — R609 Edema, unspecified: Secondary | ICD-10-CM

## 2015-02-02 DIAGNOSIS — N183 Chronic kidney disease, stage 3 unspecified: Secondary | ICD-10-CM

## 2015-02-02 DIAGNOSIS — I82439 Acute embolism and thrombosis of unspecified popliteal vein: Secondary | ICD-10-CM

## 2015-02-02 DIAGNOSIS — I482 Chronic atrial fibrillation, unspecified: Secondary | ICD-10-CM

## 2015-02-02 DIAGNOSIS — D631 Anemia in chronic kidney disease: Secondary | ICD-10-CM

## 2015-02-02 DIAGNOSIS — I82402 Acute embolism and thrombosis of unspecified deep veins of left lower extremity: Secondary | ICD-10-CM | POA: Insufficient documentation

## 2015-02-02 DIAGNOSIS — D630 Anemia in neoplastic disease: Secondary | ICD-10-CM

## 2015-02-02 DIAGNOSIS — D696 Thrombocytopenia, unspecified: Secondary | ICD-10-CM

## 2015-02-02 DIAGNOSIS — J449 Chronic obstructive pulmonary disease, unspecified: Secondary | ICD-10-CM | POA: Insufficient documentation

## 2015-02-02 DIAGNOSIS — R0602 Shortness of breath: Secondary | ICD-10-CM | POA: Diagnosis not present

## 2015-02-02 DIAGNOSIS — I509 Heart failure, unspecified: Secondary | ICD-10-CM | POA: Diagnosis not present

## 2015-02-02 DIAGNOSIS — L03116 Cellulitis of left lower limb: Secondary | ICD-10-CM

## 2015-02-02 DIAGNOSIS — Z5181 Encounter for therapeutic drug level monitoring: Secondary | ICD-10-CM

## 2015-02-02 DIAGNOSIS — I82492 Acute embolism and thrombosis of other specified deep vein of left lower extremity: Secondary | ICD-10-CM

## 2015-02-02 DIAGNOSIS — D469 Myelodysplastic syndrome, unspecified: Secondary | ICD-10-CM

## 2015-02-02 DIAGNOSIS — N189 Chronic kidney disease, unspecified: Secondary | ICD-10-CM

## 2015-02-02 LAB — CBC WITH DIFFERENTIAL/PLATELET
BASO%: 1.1 % (ref 0.0–2.0)
BASOS ABS: 0.1 10*3/uL (ref 0.0–0.1)
EOS%: 6 % (ref 0.0–7.0)
Eosinophils Absolute: 0.4 10*3/uL (ref 0.0–0.5)
HEMATOCRIT: 28.4 % — AB (ref 38.4–49.9)
HGB: 9.3 g/dL — ABNORMAL LOW (ref 13.0–17.1)
LYMPH#: 0.9 10*3/uL (ref 0.9–3.3)
LYMPH%: 14.6 % (ref 14.0–49.0)
MCH: 32.6 pg (ref 27.2–33.4)
MCHC: 32.7 g/dL (ref 32.0–36.0)
MCV: 99.6 fL — AB (ref 79.3–98.0)
MONO#: 1 10*3/uL — ABNORMAL HIGH (ref 0.1–0.9)
MONO%: 16 % — AB (ref 0.0–14.0)
NEUT#: 4 10*3/uL (ref 1.5–6.5)
NEUT%: 62.3 % (ref 39.0–75.0)
Platelets: 155 10*3/uL (ref 140–400)
RBC: 2.85 10*6/uL — ABNORMAL LOW (ref 4.20–5.82)
RDW: 15.4 % — ABNORMAL HIGH (ref 11.0–14.6)
WBC: 6.4 10*3/uL (ref 4.0–10.3)

## 2015-02-02 LAB — PROTIME-INR
INR: 2.2 (ref 2.00–3.50)
PROTIME: 26.4 s — AB (ref 10.6–13.4)

## 2015-02-02 LAB — POCT INR: INR: 2.2

## 2015-02-02 MED ORDER — DARBEPOETIN ALFA 300 MCG/0.6ML IJ SOSY
300.0000 ug | PREFILLED_SYRINGE | Freq: Once | INTRAMUSCULAR | Status: AC
  Administered 2015-02-02: 300 ug via SUBCUTANEOUS
  Filled 2015-02-02: qty 0.6

## 2015-02-02 MED ORDER — TECHNETIUM TO 99M ALBUMIN AGGREGATED
5.5000 | Freq: Once | INTRAVENOUS | Status: AC | PRN
  Administered 2015-02-02: 6 via INTRAVENOUS

## 2015-02-02 MED ORDER — TECHNETIUM TC 99M DIETHYLENETRIAME-PENTAACETIC ACID: 43.8000 | Freq: Once | INTRAVENOUS | Status: AC | PRN

## 2015-02-02 NOTE — Telephone Encounter (Signed)
Pls add patient to be seen today at 115 pm

## 2015-02-02 NOTE — Telephone Encounter (Signed)
added appt per staff....per staff pt already aware

## 2015-02-02 NOTE — Progress Notes (Signed)
INR goal = 2-3 per Dr. Alvy Bimler. Dx: New DVT, Atrial fibrillation Pt with MDS, CRI, and receives Aranesp injections at [facility]. Previous INR goal = 1.5-2 per Dr. Lamonte Sakai. MD would like to have patient monitored here since receiving Aranesp injections here. Dr. Alvy Bimler would like higher INR goal of 2-3 with new dx DVT and pt receiving Aranesp injections. Dr Alvy Bimler spoke with PCP. She feels new DVT likely due to subtherapeutic INR, recent trauma, cellulitis, and poor circulation. Pt has been on coumadin for over 30 years. Previously monitored at [facility]. Usual bruising. No bleeding noted. No missed or extra coumadin doses. No changes in diet. Pt started Augmentin x 7 days for cellulitis on 01/31/15. INR on 01/31/15 = 2.3 at outside office. Will continue to take 2.5mg  (1 tablet) daily. Recheck INR in 1 week on 02/08/15; lab at 2:30pm and coumadin clinic at 2:45pm. INR may slightly decrease after Augmentin completes.  MD wants to keep INR between 2-3 with new DVT.

## 2015-02-02 NOTE — Patient Instructions (Signed)
Continue to take 2.5mg  (1 tablet) daily.  Recheck INR in 1 week on 02/08/15; lab at 2:30pm and coumadin clinic at 2:45pm.

## 2015-02-02 NOTE — Telephone Encounter (Signed)
gv adn printed appt sched and avs forpt for Feb and March °

## 2015-02-02 NOTE — Telephone Encounter (Signed)
Dr Alvy Bimler would like to see patient at 11:00 to discuss DVT. Wife verbalized understanding

## 2015-02-03 NOTE — Assessment & Plan Note (Signed)
The patient is doing very well on darbepoetin injection. Even in the acute setting of acute DVT, which I think is trauma related, I do not think we need to hold treatment. We will continue injection every other month to keep hemoglobin above 10 g.

## 2015-02-03 NOTE — Assessment & Plan Note (Signed)
He will remain on lifelong anticoagulation therapy to prevent stroke.

## 2015-02-03 NOTE — Progress Notes (Signed)
[facility] OFFICE PROGRESS NOTE  Patient Care Team: Biagio Borg, MD as PCP - General (Internal Medicine) Heath Lark, MD as PCP - Hematology/Oncology (Hematology and Oncology) Collene Gobble, MD as Referring Physician (Pulmonary Disease) Minus Breeding, MD as Attending Physician (Cardiology) Elayne Snare, MD as Attending Physician (Endocrinology) Evans Lance, MD as Consulting Physician (Cardiology)  SUMMARY OF ONCOLOGIC HISTORY: This is a patient who has been followed here for many years. He had history of chronic anemia for some time and had a bone marrow aspirate and biopsy done in 2007 that was unremarkable. Due to chronic anemia and chronic kidney disease he was referred to the [facility] to receive erythropoietin stimulating agents. Due to iron deficiency anemia, he had received one dose of intravenous iron infusion in January 2014 and darbepoetin injection since June of 2014. On 10/30/2013, he had bone marrow aspirate and biopsy showed dyspoietic changes suggested for early myelodysplastic syndrome. We resume erythropoietin stimulating agents every other week to keep hemoglobin above 10 g. the frequency of erythropoietin stimulating agents is subsequently changed to every 4 weeks. In April 2015, the patient had permanent pacemaker implantation for irregular heartbeat and congestive heart failure. He placed this on anticoagulation therapy with warfarin to prevent stroke On 02/01/2015, he was found to have acute DVT on the left lower extremity, in the setting of cellulitis and trauma with subtherapeutic INR INTERVAL HISTORY: Please see below for problem oriented charting. He is seen urgently today because of recent diagnosis of DVT. The patient had repeated trauma to the left leg recently. Starting last weekend, he noted redness and swelling of the left leg. He was seen in the pulmonology clinic and was prescribed antibiotic for cellulitis. Left lower extremity ultrasound  confirm DVT. He is seen urgently for further management of anticoagulation therapy and whether he should continue on erythropoietin stimulating agent for anemia as it could cause DVT. The patient have chronic shortness of breath which he does not think it was worse. He had recent hemoptysis which he attributed to forceful coughing. He has chronic lower extremity edema. His appetite is stable, denies any recent weight loss.  REVIEW OF SYSTEMS:   Constitutional: Denies fevers, chills or abnormal weight loss Eyes: Denies blurriness of vision Ears, nose, mouth, throat, and face: Denies mucositis or sore throat Cardiovascular: Denies palpitation, chest discomfort or lower extremity swelling Gastrointestinal:  Denies nausea, heartburn  Lymphatics: Denies new lymphadenopathy or easy bruising Neurological:Denies numbness, tingling or new weaknesses Behavioral/Psych: Mood is stable, no new changes  All other systems were reviewed with the patient and are negative.  I have reviewed the past medical history, past surgical history, social history and family history with the patient and they are unchanged from previous note.  ALLERGIES:  is allergic to keflex; codeine; ramipril; and vancomycin.  MEDICATIONS:  Current Outpatient Prescriptions  Medication Sig Dispense Refill  . ACCU-CHEK AVIVA PLUS test strip USE ONCE DAILY AS DIRECTED 50 each 5  . acetaminophen (TYLENOL) 500 MG tablet Take 1,000 mg by mouth every 8 (eight) hours as needed for moderate pain or headache.     Marland Kitchen amoxicillin-clavulanate (AUGMENTIN) 875-125 MG per tablet Take 1 tablet by mouth 2 (two) times daily. 14 tablet 0  . Calcium Carbonate-Vit D-Min (CALCIUM 1200 PO) Take 1,200 mg by mouth daily.     . Cholecalciferol (VITAMIN D) 1000 UNITS capsule Take 1,000 Units by mouth daily.     . clotrimazole-betamethasone (LOTRISONE) cream Apply 1 application topically daily.    Marland Kitchen  docusate sodium (COLACE) 100 MG capsule Take 100 mg by mouth  2 (two) times daily.    Marland Kitchen doxazosin (CARDURA) 2 MG tablet TAKE 1 TABLET BY MOUTH EVERY DAY 30 tablet 4  . fish oil-omega-3 fatty acids 1000 MG capsule Take 1 g by mouth daily.    . furosemide (LASIX) 80 MG tablet Take 1 tablet (80 mg total) by mouth daily. 30 tablet 5  . glipiZIDE (GLUCOTROL XL) 2.5 MG 24 hr tablet TAKE 1 TABLET BY MOUTH EVERY DAY 30 tablet 3  . levothyroxine (SYNTHROID, LEVOTHROID) 150 MCG tablet TAKE 1 TABLET BY MOUTH EVERY MORNING ON AN EMPTY STOMACH 30 tablet 5  . losartan (COZAAR) 50 MG tablet TAKE 1 TABLET BY MOUTH EVERY DAY 30 tablet 3  . Magnesium 300 MG CAPS Take 300 mg by mouth.    . Misc Natural Products (OSTEO BI-FLEX JOINT SHIELD PO) Take 1 tablet by mouth 2 (two) times daily.     Marland Kitchen oxybutynin (DITROPAN) 5 MG tablet Take 5 mg by mouth every 6 (six) hours as needed for bladder spasms.     . polyethylene glycol (MIRALAX / GLYCOLAX) packet Take 17 g by mouth daily.    Marland Kitchen tobramycin (TOBREX) 0.3 % ophthalmic solution Place 1 drop into both eyes 2 (two) times daily.    . vitamin B-12 (CYANOCOBALAMIN) 1000 MCG tablet Take 1,000 mcg by mouth daily.    . vitamin C (ASCORBIC ACID) 500 MG tablet Take 500 mg by mouth daily.    Marland Kitchen warfarin (COUMADIN) 2.5 MG tablet TAKE AS DIRECTED BY ANTICOAGULATION CLINIC 35 tablet 2   No current facility-administered medications for this visit.    PHYSICAL EXAMINATION: ECOG PERFORMANCE STATUS: 2 - Symptomatic, <50% confined to bed  Filed Vitals:   02/02/15 1115  BP: 119/43  Pulse: 59  Temp: 98.2 F (36.8 C)  Resp: 18   Filed Weights   02/02/15 1115  Weight: 155 lb 1.6 oz (70.353 kg)    GENERAL:alert, no distress and comfortable. He appeared frail and debilitated SKIN: No cellulitis on the left lower extremity. EYES: normal, Conjunctiva are pink and non-injected, sclera clear OROPHARYNX:no exudate, no erythema and lips, buccal mucosa, and tongue normal  NECK: supple, thyroid normal size, non-tender, without nodularity LYMPH:   no palpable lymphadenopathy in the cervical, axillary or inguinal LUNGS: clear to auscultation and percussion with normal breathing effort HEART: regular rate & rhythm and no murmurs with moderate bilateral lower extremity edema ABDOMEN:abdomen soft, non-tender and normal bowel sounds Musculoskeletal:no cyanosis of digits and no clubbing  NEURO: alert & oriented x 3 with fluent speech, no focal motor/sensory deficits  LABORATORY DATA:  I have reviewed the data as listed    Component Value Date/Time   NA 137 01/31/2015 1256   NA 139 11/17/2014 1427   K 4.7 01/31/2015 1256   K 4.5 11/17/2014 1427   CL 108 01/31/2015 1256   CL 104 06/19/2013 1517   CO2 22 01/31/2015 1256   CO2 23 11/17/2014 1427   GLUCOSE 142* 01/31/2015 1256   GLUCOSE 141* 11/17/2014 1427   GLUCOSE 90 06/19/2013 1517   BUN 85* 01/31/2015 1256   BUN 87.1* 11/17/2014 1427   CREATININE 1.98* 01/31/2015 1256   CREATININE 2.0* 11/17/2014 1427   CALCIUM 8.8 01/31/2015 1256   CALCIUM 9.0 11/17/2014 1427   PROT 7.2 11/17/2014 1427   PROT 7.5 10/20/2014 1410   ALBUMIN 3.6 11/17/2014 1427   ALBUMIN 3.4* 10/20/2014 1410   AST 17 11/17/2014 1427  AST 16 10/20/2014 1410   ALT 14 11/17/2014 1427   ALT 14 10/20/2014 1410   ALKPHOS 126 11/17/2014 1427   ALKPHOS 111 10/20/2014 1410   BILITOT 0.80 11/17/2014 1427   BILITOT 0.9 10/20/2014 1410    No results found for: SPEP, UPEP  Lab Results  Component Value Date   WBC 6.4 02/02/2015   NEUTROABS 4.0 02/02/2015   HGB 9.3* 02/02/2015   HCT 28.4* 02/02/2015   MCV 99.6* 02/02/2015   PLT 155 02/02/2015      Chemistry      Component Value Date/Time   NA 137 01/31/2015 1256   NA 139 11/17/2014 1427   K 4.7 01/31/2015 1256   K 4.5 11/17/2014 1427   CL 108 01/31/2015 1256   CL 104 06/19/2013 1517   CO2 22 01/31/2015 1256   CO2 23 11/17/2014 1427   BUN 85* 01/31/2015 1256   BUN 87.1* 11/17/2014 1427   CREATININE 1.98* 01/31/2015 1256   CREATININE 2.0* 11/17/2014  1427      Component Value Date/Time   CALCIUM 8.8 01/31/2015 1256   CALCIUM 9.0 11/17/2014 1427   ALKPHOS 126 11/17/2014 1427   ALKPHOS 111 10/20/2014 1410   AST 17 11/17/2014 1427   AST 16 10/20/2014 1410   ALT 14 11/17/2014 1427   ALT 14 10/20/2014 1410   BILITOT 0.80 11/17/2014 1427   BILITOT 0.9 10/20/2014 1410       RADIOGRAPHIC STUDIES: I have personally reviewed the radiological images as listed and agreed with the findings in the report. Dg Chest 2 View  02/02/2015   CLINICAL DATA:  Days of shortness of breath, history of CHF. And deep venous thrombosis ; free V/Q scan examination.  EXAM: CHEST  2 VIEW  COMPARISON:  PA and lateral chest x-ray of January 31, 2015  FINDINGS: The lungs are hyperinflated. There is no focal infiltrate. The interstitial markings have improved since the previous study. The cardiac silhouette remains enlarged. The central pulmonary vascularity remains engorged but is more distinct. There is stable apical pleural thickening bilaterally. The permanent pacemaker is in appropriate position radiographically. There are chronic degenerative changes of both shoulders. There are metallic foreign bodies projecting over the left shoulder and axillary region.  IMPRESSION: COPD with enlargement of the cardiac silhouette without significant pulmonary edema. There is no evidence of pneumonia.   Electronically Signed   By: David  Martinique   On: 02/02/2015 15:48   Nm Pulmonary Per & Vent  02/02/2015   CLINICAL DATA:  Leg edema and shortness  EXAM: NUCLEAR MEDICINE VENTILATION - PERFUSION LUNG SCAN  TECHNIQUE: Ventilation images were obtained in multiple projections using inhaled aerosol technetium 99 M DTPA. Perfusion images were obtained in multiple projections after intravenous injection of Tc-30m MAA.  RADIOPHARMACEUTICALS:  43.8 MCi Tc-87m DTPA aerosol and 5.5 mCi Tc-38m MAA  COMPARISON:  02/26/2013  FINDINGS: Ventilation: No focal ventilation defect.  Perfusion: No wedge  shaped peripheral perfusion defects to suggest acute pulmonary embolism.  IMPRESSION: Negative for pulmonary embolism; lung perfusion is normal.   Electronically Signed   By: Jorje Guild M.D.   On: 02/02/2015 14:54     ASSESSMENT & PLAN:  MDS (myelodysplastic syndrome) The patient is doing very well on darbepoetin injection. Even in the acute setting of acute DVT, which I think is trauma related, I do not think we need to hold treatment. We will continue injection every other month to keep hemoglobin above 10 g.   Acute deep vein thrombosis (DVT)  of other specified vein of left lower extremity I felt that this acute event is precipitated by recent cellulitis and trauma to the leg. His anticoagulation therapy has been somewhat subtherapeutic over the last few months. I have obtained permission from his PCP to take over anticoagulation management from now on. Goal therapy would be minimum 3 months for DVT but lifelong for chronic atrial fibrillation. Goal INR would be 2-3.   Anemia in chronic renal disease He will continue darbepoetin injection to keep hemoglobin above 10 g.   Cellulitis of leg, left He is receiving antibiotic therapy for cellulitis. Since then, this is improving. Due to potential interaction with warfarin, I have asked his INR to be rechecked today to make sure that his blood work did not show supratherapeutic INR   Chronic atrial fibrillation He will remain on lifelong anticoagulation therapy to prevent stroke.   SOB (shortness of breath) Overall, I felt that his chronic shortness of breath is due to anemia and pulmonary hypertension. I spoke with the pulmonologist Nurse practitioner who recommended he proceed with VQ scan to exclude pulmonary infarction as it would change therapy management. If he has pulmonary emboli, the patient will qualify for IVC filter. At the time of dictation, VQ scan was negative.   Thrombocytopenia This is related to his  underlying myelodysplastic syndrome. He is not symptomatic. As long as his platelet count is above 50,000, there is no contraindication for him to remain on anticoagulation therapy.   Chronic kidney disease, stage III (moderate) The question is raised whether the patient may have formed a clot while on warfarin. On review of his anticoagulation therapy, the patient had been somewhat subtherapeutic over the last few months. In the setting of chronic renal failure, warfarin is the only safe choice for him. We will proceed with anticoagulation therapy with warfarin only.    Orders Placed This Encounter  Procedures  . Protime-INR    Standing Status: Standing     Number of Occurrences: 33     Standing Expiration Date: 02/03/2016  . Oncology referral to [facility]    Referral Priority:  Routine    Referral Type:  Consultation    Referral Reason:  Specialty Services Required    Number of Visits Requested:  1   All questions were answered. The patient knows to call the clinic with any problems, questions or concerns. No barriers to learning was detected. I spent 40 minutes counseling the patient face to face. The total time spent in the appointment was 55 minutes and more than 50% was on counseling and review of test results     [facility], [place], MD 02/03/2015 8:31 AM

## 2015-02-03 NOTE — Telephone Encounter (Signed)
His creat is stable.  I would suggest that he continue the current meds.

## 2015-02-03 NOTE — Assessment & Plan Note (Signed)
Overall, I felt that his chronic shortness of breath is due to anemia and pulmonary hypertension. I spoke with the pulmonologist Nurse practitioner who recommended he proceed with VQ scan to exclude pulmonary infarction as it would change therapy management. If he has pulmonary emboli, the patient will qualify for IVC filter. At the time of dictation, VQ scan was negative.

## 2015-02-03 NOTE — Assessment & Plan Note (Signed)
The question is raised whether the patient may have formed a clot while on warfarin. On review of his anticoagulation therapy, the patient had been somewhat subtherapeutic over the last few months. In the setting of chronic renal failure, warfarin is the only safe choice for him. We will proceed with anticoagulation therapy with warfarin only.

## 2015-02-03 NOTE — Assessment & Plan Note (Signed)
He will continue darbepoetin injection to keep hemoglobin above 10 g.

## 2015-02-03 NOTE — Assessment & Plan Note (Signed)
This is related to his underlying myelodysplastic syndrome. He is not symptomatic. As long as his platelet count is above 50,000, there is no contraindication for him to remain on anticoagulation therapy.

## 2015-02-03 NOTE — Assessment & Plan Note (Signed)
I felt that this acute event is precipitated by recent cellulitis and trauma to the leg. His anticoagulation therapy has been somewhat subtherapeutic over the last few months. I have obtained permission from his PCP to take over anticoagulation management from now on. Goal therapy would be minimum 3 months for DVT but lifelong for chronic atrial fibrillation. Goal INR would be 2-3.

## 2015-02-03 NOTE — Assessment & Plan Note (Addendum)
He is receiving antibiotic therapy for cellulitis. Since then, this is improving. Due to potential interaction with warfarin, I have asked his INR to be rechecked today to make sure that his blood work did not show supratherapeutic INR

## 2015-02-04 ENCOUNTER — Ambulatory Visit (HOSPITAL_COMMUNITY): Payer: Medicare Other

## 2015-02-07 ENCOUNTER — Encounter: Payer: Self-pay | Admitting: *Deleted

## 2015-02-07 NOTE — Telephone Encounter (Signed)
Pt. informed to continue his current meds, pt stated understanding of instructions

## 2015-02-08 ENCOUNTER — Other Ambulatory Visit (HOSPITAL_BASED_OUTPATIENT_CLINIC_OR_DEPARTMENT_OTHER): Payer: Medicare Other

## 2015-02-08 ENCOUNTER — Ambulatory Visit (HOSPITAL_BASED_OUTPATIENT_CLINIC_OR_DEPARTMENT_OTHER): Payer: Medicare Other | Admitting: Pharmacist

## 2015-02-08 DIAGNOSIS — D469 Myelodysplastic syndrome, unspecified: Secondary | ICD-10-CM

## 2015-02-08 DIAGNOSIS — I82439 Acute embolism and thrombosis of unspecified popliteal vein: Secondary | ICD-10-CM

## 2015-02-08 DIAGNOSIS — Z5181 Encounter for therapeutic drug level monitoring: Secondary | ICD-10-CM

## 2015-02-08 DIAGNOSIS — I482 Chronic atrial fibrillation: Secondary | ICD-10-CM

## 2015-02-08 DIAGNOSIS — I82492 Acute embolism and thrombosis of other specified deep vein of left lower extremity: Secondary | ICD-10-CM

## 2015-02-08 LAB — CBC WITH DIFFERENTIAL/PLATELET
BASO%: 0.8 % (ref 0.0–2.0)
Basophils Absolute: 0.1 10*3/uL (ref 0.0–0.1)
EOS%: 3.3 % (ref 0.0–7.0)
Eosinophils Absolute: 0.3 10*3/uL (ref 0.0–0.5)
HCT: 30 % — ABNORMAL LOW (ref 38.4–49.9)
HGB: 9.7 g/dL — ABNORMAL LOW (ref 13.0–17.1)
LYMPH#: 0.9 10*3/uL (ref 0.9–3.3)
LYMPH%: 11.6 % — ABNORMAL LOW (ref 14.0–49.0)
MCH: 32.8 pg (ref 27.2–33.4)
MCHC: 32.3 g/dL (ref 32.0–36.0)
MCV: 101.4 fL — ABNORMAL HIGH (ref 79.3–98.0)
MONO#: 1 10*3/uL — AB (ref 0.1–0.9)
MONO%: 12.9 % (ref 0.0–14.0)
NEUT#: 5.7 10*3/uL (ref 1.5–6.5)
NEUT%: 71.4 % (ref 39.0–75.0)
NRBC: 1 % — AB (ref 0–0)
PLATELETS: 165 10*3/uL (ref 140–400)
RBC: 2.96 10*6/uL — AB (ref 4.20–5.82)
RDW: 16.5 % — ABNORMAL HIGH (ref 11.0–14.6)
WBC: 7.9 10*3/uL (ref 4.0–10.3)

## 2015-02-08 LAB — PROTIME-INR
INR: 2.5 (ref 2.00–3.50)
PROTIME: 30 s — AB (ref 10.6–13.4)

## 2015-02-08 LAB — POCT INR: INR: 2.5

## 2015-02-08 NOTE — Progress Notes (Signed)
INR = 2.5    Goal 2-3 INR is within goal range. Patient's family question why patient is at same dose (17.5 mg weekly), yet INR is now within the new therapeutic range. He had been taking this dose with INR 1.5-2 for quite some time. I explained that the INR is likely raised from the Augmentin which he just completed on Sunday. It is likely that his INR will fall somewhat now that the he is not on an antibiotic. I will be proactive and slightly increase his dose. We will increase Coumadin to 2.5mg  (1 tablet) daily, except 3.75 mg (1 1/2 tablets) on Wednesdays. We will recheck INR  on 02/18/15; lab at 2:30pm and Coumadin clinic at 2:45pm.  Theone Murdoch, PharmD

## 2015-02-09 ENCOUNTER — Other Ambulatory Visit: Payer: Self-pay | Admitting: Endocrinology

## 2015-02-14 ENCOUNTER — Other Ambulatory Visit: Payer: Self-pay | Admitting: Cardiovascular Disease

## 2015-02-15 ENCOUNTER — Other Ambulatory Visit: Payer: Self-pay

## 2015-02-18 ENCOUNTER — Ambulatory Visit (HOSPITAL_BASED_OUTPATIENT_CLINIC_OR_DEPARTMENT_OTHER): Payer: Self-pay | Admitting: Pharmacist

## 2015-02-18 ENCOUNTER — Other Ambulatory Visit (HOSPITAL_BASED_OUTPATIENT_CLINIC_OR_DEPARTMENT_OTHER): Payer: Medicare Other

## 2015-02-18 DIAGNOSIS — D469 Myelodysplastic syndrome, unspecified: Secondary | ICD-10-CM

## 2015-02-18 DIAGNOSIS — I82492 Acute embolism and thrombosis of other specified deep vein of left lower extremity: Secondary | ICD-10-CM

## 2015-02-18 DIAGNOSIS — Z5181 Encounter for therapeutic drug level monitoring: Secondary | ICD-10-CM

## 2015-02-18 DIAGNOSIS — I82439 Acute embolism and thrombosis of unspecified popliteal vein: Secondary | ICD-10-CM

## 2015-02-18 LAB — CBC WITH DIFFERENTIAL/PLATELET
BASO%: 0.8 % (ref 0.0–2.0)
Basophils Absolute: 0.1 10*3/uL (ref 0.0–0.1)
EOS%: 3 % (ref 0.0–7.0)
Eosinophils Absolute: 0.2 10*3/uL (ref 0.0–0.5)
HEMATOCRIT: 31.3 % — AB (ref 38.4–49.9)
HGB: 10.1 g/dL — ABNORMAL LOW (ref 13.0–17.1)
LYMPH%: 12 % — ABNORMAL LOW (ref 14.0–49.0)
MCH: 32.6 pg (ref 27.2–33.4)
MCHC: 32.3 g/dL (ref 32.0–36.0)
MCV: 101 fL — AB (ref 79.3–98.0)
MONO#: 0.8 10*3/uL (ref 0.1–0.9)
MONO%: 10.8 % (ref 0.0–14.0)
NEUT#: 5.3 10*3/uL (ref 1.5–6.5)
NEUT%: 73.4 % (ref 39.0–75.0)
NRBC: 0 % (ref 0–0)
Platelets: 151 10*3/uL (ref 140–400)
RBC: 3.1 10*6/uL — ABNORMAL LOW (ref 4.20–5.82)
RDW: 16.4 % — ABNORMAL HIGH (ref 11.0–14.6)
WBC: 7.3 10*3/uL (ref 4.0–10.3)
lymph#: 0.9 10*3/uL (ref 0.9–3.3)

## 2015-02-18 LAB — PROTIME-INR
INR: 2.8 (ref 2.00–3.50)
Protime: 33.6 Seconds — ABNORMAL HIGH (ref 10.6–13.4)

## 2015-02-18 LAB — POCT INR: INR: 2.8

## 2015-02-18 NOTE — Progress Notes (Signed)
INR = 2.8 on Coumadin 2.5 mg/day except 3.75 mg on Wed. Pt using Coricidin for sore throat.  Off abx. Meds reviewed.  No recent changes other than Coricidin. Pts family present and had some questions about the INR range.  Had a lower INR goal range in past.  We reviewed the risks & benefits and at this time, Dr. Alvy Bimler in favor of our present goal of 2-3 (evidence based). Pt mentions no h/o significant bleeding. INR at goal.  Cont same Coumadin dose. Return in 3 weeks. Kennith Center, Pharm.D., CPP 02/18/2015@3 :10 PM

## 2015-02-22 ENCOUNTER — Ambulatory Visit: Payer: Medicare Other | Admitting: Adult Health

## 2015-02-22 ENCOUNTER — Other Ambulatory Visit: Payer: Medicare Other

## 2015-02-25 ENCOUNTER — Ambulatory Visit: Payer: Medicare Other | Admitting: Endocrinology

## 2015-03-11 ENCOUNTER — Ambulatory Visit (HOSPITAL_BASED_OUTPATIENT_CLINIC_OR_DEPARTMENT_OTHER): Payer: Self-pay | Admitting: Pharmacist

## 2015-03-11 ENCOUNTER — Other Ambulatory Visit (HOSPITAL_BASED_OUTPATIENT_CLINIC_OR_DEPARTMENT_OTHER): Payer: Medicare Other

## 2015-03-11 DIAGNOSIS — Z5181 Encounter for therapeutic drug level monitoring: Secondary | ICD-10-CM

## 2015-03-11 DIAGNOSIS — I82439 Acute embolism and thrombosis of unspecified popliteal vein: Secondary | ICD-10-CM

## 2015-03-11 DIAGNOSIS — D469 Myelodysplastic syndrome, unspecified: Secondary | ICD-10-CM

## 2015-03-11 DIAGNOSIS — I82492 Acute embolism and thrombosis of other specified deep vein of left lower extremity: Secondary | ICD-10-CM

## 2015-03-11 LAB — PROTIME-INR
INR: 2.9 (ref 2.00–3.50)
Protime: 34.8 Seconds — ABNORMAL HIGH (ref 10.6–13.4)

## 2015-03-11 LAB — CBC WITH DIFFERENTIAL/PLATELET
BASO%: 1.2 % (ref 0.0–2.0)
Basophils Absolute: 0.1 10*3/uL (ref 0.0–0.1)
EOS ABS: 0.3 10*3/uL (ref 0.0–0.5)
EOS%: 6 % (ref 0.0–7.0)
HCT: 29.9 % — ABNORMAL LOW (ref 38.4–49.9)
HGB: 9.3 g/dL — ABNORMAL LOW (ref 13.0–17.1)
LYMPH%: 18.7 % (ref 14.0–49.0)
MCH: 31.8 pg (ref 27.2–33.4)
MCHC: 31.2 g/dL — AB (ref 32.0–36.0)
MCV: 102 fL — ABNORMAL HIGH (ref 79.3–98.0)
MONO#: 0.7 10*3/uL (ref 0.1–0.9)
MONO%: 12.7 % (ref 0.0–14.0)
NEUT#: 3.5 10*3/uL (ref 1.5–6.5)
NEUT%: 61.4 % (ref 39.0–75.0)
Platelets: 157 10*3/uL (ref 140–400)
RBC: 2.93 10*6/uL — ABNORMAL LOW (ref 4.20–5.82)
RDW: 16.2 % — ABNORMAL HIGH (ref 11.0–14.6)
WBC: 5.7 10*3/uL (ref 4.0–10.3)
lymph#: 1.1 10*3/uL (ref 0.9–3.3)

## 2015-03-11 LAB — POCT INR: INR: 2.9

## 2015-03-11 NOTE — Progress Notes (Signed)
INR remains at goal on coumadin 2.5mg  daily but 3.75mg  on Wednesdays.  No medication changes.  Douglas George has had some bleeding out of lesions on his head and ear.  He has an appt with dermatologist next week to have evaluated.  He also has some bruising on his forearm that appears to be healing.  Will continue current coumadin dose since INR remains at goal and check PT/INR in 3 weeks with next MD appt.

## 2015-03-13 ENCOUNTER — Other Ambulatory Visit: Payer: Self-pay | Admitting: Internal Medicine

## 2015-03-14 ENCOUNTER — Ambulatory Visit: Payer: Medicare Other | Admitting: Emergency Medicine

## 2015-03-14 ENCOUNTER — Other Ambulatory Visit: Payer: Self-pay | Admitting: General Practice

## 2015-03-14 DIAGNOSIS — I4891 Unspecified atrial fibrillation: Secondary | ICD-10-CM

## 2015-03-14 MED ORDER — WARFARIN SODIUM 2.5 MG PO TABS
ORAL_TABLET | ORAL | Status: DC
Start: 2015-03-14 — End: 2015-08-23

## 2015-03-16 ENCOUNTER — Telehealth: Payer: Self-pay | Admitting: Hematology and Oncology

## 2015-03-16 NOTE — Telephone Encounter (Signed)
per Melissa to add CC to pt appt 3/30-pt aware

## 2015-03-26 ENCOUNTER — Other Ambulatory Visit: Payer: Self-pay | Admitting: Endocrinology

## 2015-03-30 ENCOUNTER — Ambulatory Visit (HOSPITAL_BASED_OUTPATIENT_CLINIC_OR_DEPARTMENT_OTHER): Payer: Medicare Other | Admitting: Hematology and Oncology

## 2015-03-30 ENCOUNTER — Encounter: Payer: Self-pay | Admitting: Hematology and Oncology

## 2015-03-30 ENCOUNTER — Telehealth: Payer: Self-pay | Admitting: Hematology and Oncology

## 2015-03-30 ENCOUNTER — Other Ambulatory Visit (HOSPITAL_BASED_OUTPATIENT_CLINIC_OR_DEPARTMENT_OTHER): Payer: Medicare Other

## 2015-03-30 ENCOUNTER — Ambulatory Visit (HOSPITAL_BASED_OUTPATIENT_CLINIC_OR_DEPARTMENT_OTHER): Payer: Self-pay | Admitting: Pharmacist

## 2015-03-30 ENCOUNTER — Ambulatory Visit (HOSPITAL_BASED_OUTPATIENT_CLINIC_OR_DEPARTMENT_OTHER): Payer: Medicare Other

## 2015-03-30 VITALS — BP 126/45 | HR 60 | Temp 98.0°F | Resp 19 | Ht 66.0 in | Wt 146.5 lb

## 2015-03-30 DIAGNOSIS — D631 Anemia in chronic kidney disease: Secondary | ICD-10-CM

## 2015-03-30 DIAGNOSIS — Z5181 Encounter for therapeutic drug level monitoring: Secondary | ICD-10-CM

## 2015-03-30 DIAGNOSIS — N189 Chronic kidney disease, unspecified: Secondary | ICD-10-CM | POA: Diagnosis not present

## 2015-03-30 DIAGNOSIS — I82492 Acute embolism and thrombosis of other specified deep vein of left lower extremity: Secondary | ICD-10-CM

## 2015-03-30 DIAGNOSIS — R634 Abnormal weight loss: Secondary | ICD-10-CM

## 2015-03-30 DIAGNOSIS — D630 Anemia in neoplastic disease: Secondary | ICD-10-CM

## 2015-03-30 DIAGNOSIS — I82439 Acute embolism and thrombosis of unspecified popliteal vein: Secondary | ICD-10-CM

## 2015-03-30 DIAGNOSIS — I259 Chronic ischemic heart disease, unspecified: Secondary | ICD-10-CM

## 2015-03-30 DIAGNOSIS — D469 Myelodysplastic syndrome, unspecified: Secondary | ICD-10-CM

## 2015-03-30 DIAGNOSIS — I482 Chronic atrial fibrillation, unspecified: Secondary | ICD-10-CM

## 2015-03-30 LAB — CBC WITH DIFFERENTIAL/PLATELET
BASO%: 0.6 % (ref 0.0–2.0)
Basophils Absolute: 0 10*3/uL (ref 0.0–0.1)
EOS%: 4.8 % (ref 0.0–7.0)
Eosinophils Absolute: 0.4 10*3/uL (ref 0.0–0.5)
HEMATOCRIT: 29.3 % — AB (ref 38.4–49.9)
HGB: 9.5 g/dL — ABNORMAL LOW (ref 13.0–17.1)
LYMPH%: 14.5 % (ref 14.0–49.0)
MCH: 32.6 pg (ref 27.2–33.4)
MCHC: 32.4 g/dL (ref 32.0–36.0)
MCV: 100.7 fL — AB (ref 79.3–98.0)
MONO#: 1.1 10*3/uL — ABNORMAL HIGH (ref 0.1–0.9)
MONO%: 15.1 % — AB (ref 0.0–14.0)
NEUT#: 4.7 10*3/uL (ref 1.5–6.5)
NEUT%: 65 % (ref 39.0–75.0)
Platelets: 143 10*3/uL (ref 140–400)
RBC: 2.91 10*6/uL — ABNORMAL LOW (ref 4.20–5.82)
RDW: 15.8 % — ABNORMAL HIGH (ref 11.0–14.6)
WBC: 7.2 10*3/uL (ref 4.0–10.3)
lymph#: 1.1 10*3/uL (ref 0.9–3.3)
nRBC: 0 % (ref 0–0)

## 2015-03-30 LAB — POCT INR: INR: 2.1

## 2015-03-30 LAB — PROTIME-INR
INR: 2.1 (ref 2.00–3.50)
Protime: 25.2 Seconds — ABNORMAL HIGH (ref 10.6–13.4)

## 2015-03-30 MED ORDER — DARBEPOETIN ALFA 300 MCG/0.6ML IJ SOSY
300.0000 ug | PREFILLED_SYRINGE | Freq: Once | INTRAMUSCULAR | Status: DC
Start: 2015-03-30 — End: 2015-03-30
  Filled 2015-03-30: qty 0.6

## 2015-03-30 MED ORDER — DARBEPOETIN ALFA 500 MCG/ML IJ SOSY
500.0000 ug | PREFILLED_SYRINGE | Freq: Once | INTRAMUSCULAR | Status: AC
  Administered 2015-03-30: 500 ug via SUBCUTANEOUS
  Filled 2015-03-30: qty 1

## 2015-03-30 NOTE — Assessment & Plan Note (Signed)
He has ischemic heart disease and is on multiple medications for this. Clinically, he has mild peripheral edema but no signs and symptoms of congestive heart failure.

## 2015-03-30 NOTE — Telephone Encounter (Signed)
Appointments per 3/30 pof already on schedule. Pt given avs report and appointments for April thru Sept.

## 2015-03-30 NOTE — Assessment & Plan Note (Signed)
He has 15 pounds of unexplained weight loss over the past few months. He'll be seeing his endocrinologist soon and I recommend repeat thyroid function tests. In the meantime, I will get him seen by a nutritionist for dietary advice.

## 2015-03-30 NOTE — Assessment & Plan Note (Signed)
He will continue darbepoetin injection to keep hemoglobin above 11 g.

## 2015-03-30 NOTE — Assessment & Plan Note (Signed)
The patient is doing very well on darbepoetin injection. Even in the acute setting of acute DVT, which I think is trauma related, I do not think we need to hold treatment. He is complaining a week of fatigue. I will plan to increase Aranesp injection to keep hemoglobin above 11 g.

## 2015-03-30 NOTE — Telephone Encounter (Signed)
lvm for pt regarding to April appt....mailed pt avs and letter for 17mths of appts.

## 2015-03-30 NOTE — Assessment & Plan Note (Signed)
He will remain on lifelong anticoagulation therapy to prevent stroke.

## 2015-03-30 NOTE — Progress Notes (Signed)
INR = 2.1     Goal 2-3 INR within goal range. Patient had bleeding on forehead a few weeks ago after having a lesion removed; this has resolved. No medication or dietary changes. H/H = 9.5/29.3, patient received Aranesp 500 mcg today. He will continue Coumadin 2.5mg  (1 tablet) daily, except 3.75 mg (1 1/2 tablets) on Wednesdays.  Recheck INR  on 04/20/15; lab at 2:15pm, injection at 2:45pm, then Coumadin clinic at [place], PharmD

## 2015-03-30 NOTE — Assessment & Plan Note (Signed)
I felt that this acute event is precipitated by recent cellulitis and trauma to the leg. Goal therapy would be minimum 3 months for DVT but lifelong for chronic atrial fibrillation. Goal INR would be 2-3. He is doing well, INR therapeutic without bleeding complications.

## 2015-03-30 NOTE — Progress Notes (Signed)
[place] OFFICE PROGRESS NOTE  Patient Care Team: Biagio Borg, MD as PCP - General (Internal Medicine) Heath Lark, MD as PCP - Hematology/Oncology (Hematology and Oncology) Collene Gobble, MD as Referring Physician (Pulmonary Disease) Minus Breeding, MD as Attending Physician (Cardiology) Elayne Snare, MD as Attending Physician (Endocrinology) Evans Lance, MD as Consulting Physician (Cardiology)  SUMMARY OF ONCOLOGIC HISTORY: This is a patient who has been followed here for many years. He had history of chronic anemia for some time and had a bone marrow aspirate and biopsy done in 2007 that was unremarkable. Due to chronic anemia and chronic kidney disease he was referred to the [facility] to receive erythropoietin stimulating agents. Due to iron deficiency anemia, he had received one dose of intravenous iron infusion in January 2014 and darbepoetin injection since June of 2014. On 10/30/2013, he had bone marrow aspirate and biopsy showed dyspoietic changes suggested for early myelodysplastic syndrome. We resume erythropoietin stimulating agents every other week to keep hemoglobin above 10 g. the frequency of erythropoietin stimulating agents is subsequently changed to every 4 weeks. In April 2015, the patient had permanent pacemaker implantation for irregular heartbeat and congestive heart failure. He placed this on anticoagulation therapy with warfarin to prevent stroke On 02/01/2015, he was found to have acute DVT on the left lower extremity, in the setting of cellulitis and trauma with subtherapeutic INR On 03/30/2015, Aranesp injection dose is increased to 500 g every 3 weeks INTERVAL HISTORY: Please see below for problem oriented charting. He is doing well. Lower extremity edema has improved. He has unexplained gradual weight loss or 15 pounds over the last 6 months. His appetite is stable. Denies chest pain or shortness of breath. The patient denies any recent  signs or symptoms of bleeding such as spontaneous epistaxis, hematuria or hematochezia. Denies recurrent skin infection or cellulitis.  REVIEW OF SYSTEMS:   Constitutional: Denies fevers, chills  Eyes: Denies blurriness of vision Ears, nose, mouth, throat, and face: Denies mucositis or sore throat Respiratory: Denies cough, dyspnea or wheezes Cardiovascular: Denies palpitation, chest discomfort  Gastrointestinal:  Denies nausea, heartburn or change in bowel habits Skin: Denies abnormal skin rashes Lymphatics: Denies new lymphadenopathy or easy bruising Neurological:Denies numbness, tingling or new weaknesses Behavioral/Psych: Mood is stable, no new changes  All other systems were reviewed with the patient and are negative.  I have reviewed the past medical history, past surgical history, social history and family history with the patient and they are unchanged from previous note.  ALLERGIES:  is allergic to keflex; codeine; ramipril; and vancomycin.  MEDICATIONS:  Current Outpatient Prescriptions  Medication Sig Dispense Refill  . ACCU-CHEK AVIVA PLUS test strip USE ONCE DAILY AS DIRECTED 50 each 5  . acetaminophen (TYLENOL) 500 MG tablet Take 1,000 mg by mouth every 8 (eight) hours as needed for moderate pain or headache.     . Calcium Carbonate-Vit D-Min (CALCIUM 1200 PO) Take 1,200 mg by mouth daily.     . Cholecalciferol (VITAMIN D) 1000 UNITS capsule Take 1,000 Units by mouth daily.     . clotrimazole-betamethasone (LOTRISONE) cream Apply 1 application topically daily.    Marland Kitchen docusate sodium (COLACE) 100 MG capsule Take 100 mg by mouth 2 (two) times daily.    Marland Kitchen doxazosin (CARDURA) 2 MG tablet TAKE 1 TABLET BY MOUTH EVERY DAY 30 tablet 4  . Flaxseed, Linseed, (FLAX SEED OIL PO) Take by mouth.    . fluorouracil (EFUDEX) 5 % cream  2  . furosemide (LASIX) 80 MG tablet TAKE 1 TABLET BY MOUTH DAILY 30 tablet 5  . glipiZIDE (GLUCOTROL XL) 2.5 MG 24 hr tablet TAKE 1 TABLET BY MOUTH  EVERY DAY 30 tablet 3  . levothyroxine (SYNTHROID, LEVOTHROID) 150 MCG tablet TAKE 1 TABLET BY MOUTH EVERY MORNING ON AN EMPTY STOMACH 30 tablet 5  . losartan (COZAAR) 50 MG tablet TAKE 1 TABLET BY MOUTH EVERY DAY 30 tablet 6  . Magnesium 300 MG CAPS Take 300 mg by mouth.    . Misc Natural Products (OSTEO BI-FLEX JOINT SHIELD PO) Take 1 tablet by mouth 2 (two) times daily.     Marland Kitchen oxybutynin (DITROPAN) 5 MG tablet Take 5 mg by mouth every 6 (six) hours as needed for bladder spasms.     . polyethylene glycol (MIRALAX / GLYCOLAX) packet Take 17 g by mouth daily.    . Pyridoxine HCl (VITAMIN B-6) 250 MG tablet Take 250 mg by mouth daily.    Marland Kitchen tobramycin (TOBREX) 0.3 % ophthalmic solution Place 1 drop into both eyes 2 (two) times daily.    . vitamin B-12 (CYANOCOBALAMIN) 1000 MCG tablet Take 1,000 mcg by mouth daily.    . vitamin C (ASCORBIC ACID) 500 MG tablet Take 500 mg by mouth daily.    Marland Kitchen warfarin (COUMADIN) 2.5 MG tablet Take as directed by anticoagulation clinic 35 tablet 3   No current facility-administered medications for this visit.    PHYSICAL EXAMINATION: ECOG PERFORMANCE STATUS: 1 - Symptomatic but completely ambulatory  Filed Vitals:   03/30/15 1333  BP: 126/45  Pulse: 60  Temp: 98 F (36.7 C)  Resp: 19   Filed Weights   03/30/15 1333  Weight: 146 lb 8 oz (66.452 kg)    GENERAL:alert, no distress and comfortable. He looks elderly SKIN: Note of multiple skin lesions. No petechiae rash. Old bruises are noted EYES: normal, Conjunctiva are pink and non-injected, sclera clear OROPHARYNX:no exudate, no erythema and lips, buccal mucosa, and tongue normal  NECK: supple, thyroid normal size, non-tender, without nodularity LYMPH:  no palpable lymphadenopathy in the cervical, axillary or inguinal LUNGS: clear to auscultation and percussion with normal breathing effort HEART: regular rate & rhythm and no murmurs with minimum bilateral lower extremity edema ABDOMEN:abdomen soft,  non-tender and normal bowel sounds Musculoskeletal:no cyanosis of digits and no clubbing  NEURO: alert & oriented x 3 with fluent speech, no focal motor/sensory deficits  LABORATORY DATA:  I have reviewed the data as listed    Component Value Date/Time   NA 137 01/31/2015 1256   NA 139 11/17/2014 1427   K 4.7 01/31/2015 1256   K 4.5 11/17/2014 1427   CL 108 01/31/2015 1256   CL 104 06/19/2013 1517   CO2 22 01/31/2015 1256   CO2 23 11/17/2014 1427   GLUCOSE 142* 01/31/2015 1256   GLUCOSE 141* 11/17/2014 1427   GLUCOSE 90 06/19/2013 1517   BUN 85* 01/31/2015 1256   BUN 87.1* 11/17/2014 1427   CREATININE 1.98* 01/31/2015 1256   CREATININE 2.0* 11/17/2014 1427   CALCIUM 8.8 01/31/2015 1256   CALCIUM 9.0 11/17/2014 1427   PROT 7.2 11/17/2014 1427   PROT 7.5 10/20/2014 1410   ALBUMIN 3.6 11/17/2014 1427   ALBUMIN 3.4* 10/20/2014 1410   AST 17 11/17/2014 1427   AST 16 10/20/2014 1410   ALT 14 11/17/2014 1427   ALT 14 10/20/2014 1410   ALKPHOS 126 11/17/2014 1427   ALKPHOS 111 10/20/2014 1410   BILITOT 0.80 11/17/2014  1427   BILITOT 0.9 10/20/2014 1410    No results found for: SPEP, UPEP  Lab Results  Component Value Date   WBC 7.2 03/30/2015   NEUTROABS 4.7 03/30/2015   HGB 9.5* 03/30/2015   HCT 29.3* 03/30/2015   MCV 100.7* 03/30/2015   PLT 143 03/30/2015      Chemistry      Component Value Date/Time   NA 137 01/31/2015 1256   NA 139 11/17/2014 1427   K 4.7 01/31/2015 1256   K 4.5 11/17/2014 1427   CL 108 01/31/2015 1256   CL 104 06/19/2013 1517   CO2 22 01/31/2015 1256   CO2 23 11/17/2014 1427   BUN 85* 01/31/2015 1256   BUN 87.1* 11/17/2014 1427   CREATININE 1.98* 01/31/2015 1256   CREATININE 2.0* 11/17/2014 1427      Component Value Date/Time   CALCIUM 8.8 01/31/2015 1256   CALCIUM 9.0 11/17/2014 1427   ALKPHOS 126 11/17/2014 1427   ALKPHOS 111 10/20/2014 1410   AST 17 11/17/2014 1427   AST 16 10/20/2014 1410   ALT 14 11/17/2014 1427   ALT 14  10/20/2014 1410   BILITOT 0.80 11/17/2014 1427   BILITOT 0.9 10/20/2014 1410      ASSESSMENT & PLAN:  MDS (myelodysplastic syndrome) The patient is doing very well on darbepoetin injection. Even in the acute setting of acute DVT, which I think is trauma related, I do not think we need to hold treatment. He is complaining a week of fatigue. I will plan to increase Aranesp injection to keep hemoglobin above 11 g.     Acute deep vein thrombosis (DVT) of other specified vein of left lower extremity I felt that this acute event is precipitated by recent cellulitis and trauma to the leg. Goal therapy would be minimum 3 months for DVT but lifelong for chronic atrial fibrillation. Goal INR would be 2-3. He is doing well, INR therapeutic without bleeding complications.    Anemia in chronic renal disease He will continue darbepoetin injection to keep hemoglobin above 11 g.     Chronic atrial fibrillation He will remain on lifelong anticoagulation therapy to prevent stroke.   Unexplained weight loss He has 15 pounds of unexplained weight loss over the past few months. He'll be seeing his endocrinologist soon and I recommend repeat thyroid function tests. In the meantime, I will get him seen by a nutritionist for dietary advice.   Ischemic heart disease He has ischemic heart disease and is on multiple medications for this. Clinically, he has mild peripheral edema but no signs and symptoms of congestive heart failure.    No orders of the defined types were placed in this encounter.   All questions were answered. The patient knows to call the clinic with any problems, questions or concerns. No barriers to learning was detected. I spent 25 minutes counseling the patient face to face. The total time spent in the appointment was 30 minutes and more than 50% was on counseling and review of test results     [facility], [place], MD 03/30/2015 2:53 PM

## 2015-04-04 ENCOUNTER — Other Ambulatory Visit (INDEPENDENT_AMBULATORY_CARE_PROVIDER_SITE_OTHER): Payer: Medicare Other

## 2015-04-04 ENCOUNTER — Other Ambulatory Visit: Payer: Self-pay | Admitting: *Deleted

## 2015-04-04 DIAGNOSIS — E119 Type 2 diabetes mellitus without complications: Secondary | ICD-10-CM | POA: Diagnosis not present

## 2015-04-04 DIAGNOSIS — E890 Postprocedural hypothyroidism: Secondary | ICD-10-CM

## 2015-04-04 LAB — COMPREHENSIVE METABOLIC PANEL
ALT: 15 U/L (ref 0–53)
AST: 18 U/L (ref 0–37)
Albumin: 3.8 g/dL (ref 3.5–5.2)
Alkaline Phosphatase: 109 U/L (ref 39–117)
BILIRUBIN TOTAL: 0.5 mg/dL (ref 0.2–1.2)
BUN: 67 mg/dL — ABNORMAL HIGH (ref 6–23)
CO2: 24 mEq/L (ref 19–32)
CREATININE: 2.01 mg/dL — AB (ref 0.40–1.50)
Calcium: 9.1 mg/dL (ref 8.4–10.5)
Chloride: 104 mEq/L (ref 96–112)
GFR: 33.65 mL/min — AB (ref >60.00)
Glucose, Bld: 126 mg/dL — ABNORMAL HIGH (ref 70–99)
Potassium: 4.5 mEq/L (ref 3.5–5.1)
Sodium: 137 mEq/L (ref 135–145)
Total Protein: 7.2 g/dL (ref 6.0–8.3)

## 2015-04-04 LAB — HEMOGLOBIN A1C: Hgb A1c MFr Bld: 6 % (ref 4.6–6.5)

## 2015-04-04 LAB — T4, FREE: FREE T4: 1.04 ng/dL (ref 0.60–1.60)

## 2015-04-04 LAB — TSH: TSH: 3.38 u[IU]/mL (ref 0.35–4.50)

## 2015-04-07 ENCOUNTER — Ambulatory Visit (INDEPENDENT_AMBULATORY_CARE_PROVIDER_SITE_OTHER): Payer: Medicare Other | Admitting: Endocrinology

## 2015-04-07 ENCOUNTER — Encounter: Payer: Self-pay | Admitting: Endocrinology

## 2015-04-07 VITALS — BP 140/60 | HR 64 | Temp 98.3°F | Wt 149.0 lb

## 2015-04-07 DIAGNOSIS — E038 Other specified hypothyroidism: Secondary | ICD-10-CM

## 2015-04-07 DIAGNOSIS — E119 Type 2 diabetes mellitus without complications: Secondary | ICD-10-CM

## 2015-04-07 NOTE — Patient Instructions (Signed)
Leave off Glipizide; restart only if sugar >200

## 2015-04-07 NOTE — Progress Notes (Signed)
Patient ID: Douglas George, male   DOB: 1927/12/15, 81 y.o.   MRN: [id number]   Reason for Appointment: Diabetes follow-up   History of Present Illness   Diagnosis: Type 2 DIABETES MELITUS, date of diagnosis:  2003    Previous history: He has had mild diabetes which is consistently well controlled. Previously had been on Actos which was stopped because of potentially worsening his edema. He has been on low-dose glipizide ER subsequently  Recent history:  Blood sugars have been overall well controlled as judged by his consistently normal A1c He is checking readings after evening meal only and very sporadically recently Occasionally may have high readings if he is eating something like os to her piece of cake No hypoglycemia reported, he does note the symptoms to recognize low sugars     Oral hypoglycemic drugs: Glipizide ER 2.5 mg daily.  Side effects from medications: ? Edema from Actos       Monitors blood glucose:  rarely   Glucometer: Accucheck          Blood Glucose readings from meter review: He has only 2 readings on his monitor after supper of 118 and 181  Hypoglycemia:  none         Meals: 3 meals per day.  usually trying to watch fat intake and carbohydrates.  Does drink Ensure also          Physical activity:  minimal, unable to do much           Complications: are: None    Wt Readings from Last 3 Encounters:  04/07/15 149 lb (67.586 kg)  03/30/15 146 lb 8 oz (66.452 kg)  02/02/15 155 lb 1.6 oz (70.353 kg)   Lab Results  Component Value Date   HGBA1C 6.0 04/04/2015   HGBA1C 6.0 10/20/2014   HGBA1C 6.2 06/18/2014   Lab Results  Component Value Date   LDLCALC 67 10/20/2014   CREATININE 2.01* 04/04/2015         Medication List       This list is accurate as of: 04/07/15  3:44 PM.  Always use your most recent med list.               ACCU-CHEK AVIVA PLUS test strip  Generic drug:  glucose blood  USE ONCE DAILY AS DIRECTED     acetaminophen  500 MG tablet  Commonly known as:  TYLENOL  Take 1,000 mg by mouth every 8 (eight) hours as needed for moderate pain or headache.     CALCIUM 1200 PO  Take 1,200 mg by mouth daily.     clotrimazole-betamethasone cream  Commonly known as:  LOTRISONE  Apply 1 application topically daily.     docusate sodium 100 MG capsule  Commonly known as:  COLACE  Take 100 mg by mouth 2 (two) times daily.     doxazosin 2 MG tablet  Commonly known as:  CARDURA  TAKE 1 TABLET BY MOUTH EVERY DAY     FLAX SEED OIL PO  Take by mouth.     fluorouracil 5 % cream  Commonly known as:  EFUDEX     furosemide 80 MG tablet  Commonly known as:  LASIX  TAKE 1 TABLET BY MOUTH DAILY     glipiZIDE 2.5 MG 24 hr tablet  Commonly known as:  GLUCOTROL XL  TAKE 1 TABLET BY MOUTH EVERY DAY     levothyroxine 150 MCG tablet  Commonly known as:  SYNTHROID, LEVOTHROID  TAKE 1 TABLET BY MOUTH EVERY MORNING ON AN EMPTY STOMACH     losartan 50 MG tablet  Commonly known as:  COZAAR  TAKE 1 TABLET BY MOUTH EVERY DAY     Magnesium 300 MG Caps  Take 300 mg by mouth.     OSTEO BI-FLEX JOINT SHIELD PO  Take 1 tablet by mouth 2 (two) times daily.     oxybutynin 5 MG tablet  Commonly known as:  DITROPAN  Take 5 mg by mouth every 6 (six) hours as needed for bladder spasms.     polyethylene glycol packet  Commonly known as:  MIRALAX / GLYCOLAX  Take 17 g by mouth daily.     tobramycin 0.3 % ophthalmic solution  Commonly known as:  TOBREX  Place 1 drop into both eyes 2 (two) times daily.     vitamin B-12 1000 MCG tablet  Commonly known as:  CYANOCOBALAMIN  Take 1,000 mcg by mouth daily.     vitamin B-6 250 MG tablet  Take 250 mg by mouth daily.     vitamin C 500 MG tablet  Commonly known as:  ASCORBIC ACID  Take 500 mg by mouth daily.     Vitamin D 1000 UNITS capsule  Take 1,000 Units by mouth daily.     warfarin 2.5 MG tablet  Commonly known as:  COUMADIN  Take as directed by anticoagulation clinic         Allergies:  Allergies  Allergen Reactions  . Keflex [Cephalexin] Other (See Comments)  . Codeine Nausea Only  . Ramipril Cough  . Vancomycin Nausea Only    Past Medical History  Diagnosis Date  . Hearing loss   . Skin change   . Chronic atrial fibrillation   . Diabetes mellitus type II, controlled   . Hyperlipidemia   . Depression   . Ischemic heart disease     remote stenting of the RCA in 1998 with residual LAD disease of 60 to 70% with negative nuclear in 2011  . Pulmonary hypertension     per echo in 2011  . Diverticulitis   . H/O blood clots 1990's    in L leg (related to being in a cast for surgery)  . Thrombocytopenia   . Anal fissure   . Neck mass 10/19/2013  . MDS (myelodysplastic syndrome) 12/11/2013  . CHF (congestive heart failure)   . Myocardial infarction 1999  . DVT (deep venous thrombosis) ~ 2009    "LLE"  . Pneumonia ~ 11/2013    "tx'd; not really sure if he had it or not"  . OSA (obstructive sleep apnea)     BiPAP  . Hypothyroidism   . Anemia in chronic kidney disease(285.21) 11/13/2013  . GERD (gastroesophageal reflux disease)   . Arthritis     "joints" (04/21/2014)  . Spinal stenosis, lumbar   . Gout     "little bit in the left foot"  . Complete heart block     s/p single chamber PPM implantation April 2015 ([place])    Past Surgical History  Procedure Laterality Date  . Thyroidectomy  1960  . Wrist surgery Left ~ 2003    "cut the radial artery"  . Achilles tendon repair Left ~ 2002  . Shrapnel Left     "left shoulder; left hip" [place]   . Inguinal hernia repair Right 1980  . Cataract extraction w/ intraocular lens  implant, bilateral Bilateral ~ 2007  . Vasectomy    . Colonoscopy  2009    polyps in the past  . Insert / replace / remove pacemaker  04/21/2014    single chamber PPM ([place])  . Coronary angioplasty with stent placement  1999  . Tonsillectomy and adenoidectomy  1930's  . Pocket evacuation   05-11-2014    pocket hematoma evacuation by Dr Lovena Le  . Permanent pacemaker insertion N/A 04/21/2014    Procedure: PERMANENT PACEMAKER INSERTION;  Surgeon: Evans Lance, MD;  [facility];  Service: Cardiovascular;  Laterality: N/A;  . Pocket revision N/A 05/11/2014    Procedure: POCKET REVISION/HEMATOMA REMOVAL;  Surgeon: Evans Lance, MD;  Location: [facility] C[facility] LAB;  Service: Cardiovascular;  Laterality: N/A;    Family History  Problem Relation Age of Onset  . Heart failure Mother 40  . Diabetes Mother 84  . Heart attack Father 36  . Stroke Father 106  . Aortic aneurysm Father 44    ABDOMINAL  . Cancer Son     ?  Marland Kitchen Hepatitis Son   . Diabetes Brother     Social History:  reports that he quit smoking about 63 years ago. His smoking use included Cigarettes. He has a 2 pack-year smoking history. He has never used smokeless tobacco. He reports that he drinks about 4.2 oz of alcohol per week. He reports that he does not use illicit drugs.  Review of Systems:  Hypothyroidism:  he has had long-standing hypothyroidism, no complaints of fatigue TSH levels have been very stable with 150 mcg of levothyroxine which he takes in the mornings daily  Lab Results  Component Value Date   FREET4 1.04 04/04/2015   FREET4 1.16 06/18/2014   FREET4 1.15 09/16/2013   TSH 3.38 04/04/2015   TSH 1.74 10/20/2014   TSH 2.06 06/18/2014    PROBLEM 2: Chronic leg edema:  Edema is likely from venous insufficiency primarily.  He is consistent with wearing his elastic stockings Also has been taking chronic diuretics, still taking Lasix 80 mg daily.  He has not had any recent worsening of the swelling  WEIGHT loss: This is continuing, on his last visit 6 months ago he was 60 pounds and he thinks he has had lost some muscle mass and also his pants are fitting looser.  His daughter thinks that she is having a fairly good appetite but sometimes cannot chew very well.  Taking Ensure as a supplement but  has lost 11 pounds since his last visit here and has not followed up with PCP as instructed He had a normal chest x-ray in 2/16  HYPERTENSION: He has had long-standing hypertension.  His blood pressure is somewhat difficult to auscultate and may be unequal in the 2 arms.  Currently on low-dose Cozaar from cardiologist, also taking 4 mg Cardura at bedtime He tries to get up slowly from sitting to avoid dizziness   Lipids: These have been controlled with Crestor 5 mg , also takes fish oil  Lab Results  Component Value Date   CHOL 117 10/20/2014   HDL 47.70 10/20/2014   LDLCALC 67 10/20/2014   TRIG 11.0 10/20/2014   CHOLHDL 2 10/20/2014     He has had significant osteoarthritis of his hip limiting his activity    Myelodysplastic syndrome: He has had Aranesp and the dose has been recently increased  Lab Results  Component Value Date   WBC 7.2 03/30/2015   HGB 9.5* 03/30/2015   HCT 29.3* 03/30/2015   MCV 100.7* 03/30/2015   PLT 143  03/30/2015      Examination:   BP 140/60 mmHg  Pulse 64  Temp(Src) 98.3 F (36.8 C) (Oral)  Wt 149 lb (67.586 kg)  Body mass index is 24.06 kg/(m^2).       1-2 + ankle edema present  bilaterally; he is wearing elastic stockings  ASSESSMENT/ PLAN:   1. Diabetes type 2   Blood glucose control appears fairly good with upper normal A1c  Considering his age and multiple medical problems his blood sugars are excellent and most likely can leave off his glipizide for now since it is only 2.5 mg He can reinstate this if blood sugars are consistently over 200  2. Hypothyroidism: Adequately replaced with 150 mcg dose  3. Chronic kidney disease, likely to be from nephrosclerosis.creatinine has been stable   4. Long-standing venous edema of legs. This is well-controlled and he is also using elastic stockings.   5. Hypertension: His blood pressure is well-controlled   His systolic blood pressure is higher on the right side compared to the  left  6.  WEIGHT LOSS: Advised him to see his PCP to rule out organic causes as he is continuing to lose weight despite taking Ensure supplements   Marixa Mellott 04/07/2015, 3:44 PM

## 2015-04-07 NOTE — Progress Notes (Signed)
Pre visit review using our clinic review tool, if applicable. No additional management support is needed unless otherwise documented below in the visit note. 

## 2015-04-11 ENCOUNTER — Other Ambulatory Visit: Payer: Self-pay | Admitting: Internal Medicine

## 2015-04-20 ENCOUNTER — Ambulatory Visit (HOSPITAL_BASED_OUTPATIENT_CLINIC_OR_DEPARTMENT_OTHER): Payer: Medicare Other

## 2015-04-20 ENCOUNTER — Other Ambulatory Visit (HOSPITAL_BASED_OUTPATIENT_CLINIC_OR_DEPARTMENT_OTHER): Payer: Medicare Other

## 2015-04-20 ENCOUNTER — Ambulatory Visit: Payer: Medicare Other | Admitting: Pharmacist

## 2015-04-20 VITALS — BP 147/38 | HR 60 | Temp 97.8°F

## 2015-04-20 DIAGNOSIS — D631 Anemia in chronic kidney disease: Secondary | ICD-10-CM | POA: Diagnosis not present

## 2015-04-20 DIAGNOSIS — N189 Chronic kidney disease, unspecified: Secondary | ICD-10-CM

## 2015-04-20 DIAGNOSIS — I82439 Acute embolism and thrombosis of unspecified popliteal vein: Secondary | ICD-10-CM

## 2015-04-20 DIAGNOSIS — I82492 Acute embolism and thrombosis of other specified deep vein of left lower extremity: Secondary | ICD-10-CM | POA: Diagnosis not present

## 2015-04-20 DIAGNOSIS — D630 Anemia in neoplastic disease: Secondary | ICD-10-CM

## 2015-04-20 DIAGNOSIS — D469 Myelodysplastic syndrome, unspecified: Secondary | ICD-10-CM

## 2015-04-20 DIAGNOSIS — Z5181 Encounter for therapeutic drug level monitoring: Secondary | ICD-10-CM

## 2015-04-20 LAB — CBC WITH DIFFERENTIAL/PLATELET
BASO%: 1.2 % (ref 0.0–2.0)
Basophils Absolute: 0.1 10e3/uL (ref 0.0–0.1)
EOS%: 3.7 % (ref 0.0–7.0)
Eosinophils Absolute: 0.3 10e3/uL (ref 0.0–0.5)
HCT: 32.6 % — ABNORMAL LOW (ref 38.4–49.9)
HGB: 10.4 g/dL — ABNORMAL LOW (ref 13.0–17.1)
LYMPH%: 13.3 % — ABNORMAL LOW (ref 14.0–49.0)
MCH: 31.7 pg (ref 27.2–33.4)
MCHC: 31.9 g/dL — ABNORMAL LOW (ref 32.0–36.0)
MCV: 99.4 fL — ABNORMAL HIGH (ref 79.3–98.0)
MONO#: 0.9 10e3/uL (ref 0.1–0.9)
MONO%: 13.3 % (ref 0.0–14.0)
NEUT#: 4.6 10e3/uL (ref 1.5–6.5)
NEUT%: 68.5 % (ref 39.0–75.0)
Platelets: 145 10e3/uL (ref 140–400)
RBC: 3.28 10e6/uL — ABNORMAL LOW (ref 4.20–5.82)
RDW: 15.4 % — ABNORMAL HIGH (ref 11.0–14.6)
WBC: 6.7 10e3/uL (ref 4.0–10.3)
lymph#: 0.9 10e3/uL (ref 0.9–3.3)
nRBC: 0 % (ref 0–0)

## 2015-04-20 LAB — PROTIME-INR
INR: 2.2 (ref 2.00–3.50)
Protime: 26.4 s — ABNORMAL HIGH (ref 10.6–13.4)

## 2015-04-20 LAB — POCT INR: INR: 2.2

## 2015-04-20 MED ORDER — DARBEPOETIN ALFA 500 MCG/ML IJ SOSY
500.0000 ug | PREFILLED_SYRINGE | Freq: Once | INTRAMUSCULAR | Status: AC
  Administered 2015-04-20: 500 ug via SUBCUTANEOUS
  Filled 2015-04-20: qty 1

## 2015-04-20 NOTE — Progress Notes (Signed)
INR = 2.2 on Coumadin 2.5 mg daily except 3.75 mg on Wed Hgb = 10.4 g/dL - Aranesp 500 mcg given today. Nosebleeding - has been frequent during allergy season.  He has noticed scabs in nose & had a nosebleed this week that lasted approx 30 minutes; passed clot size of "dried raisin."  He is able to get the bleeding to stop by applying pressure & inserting a rolled up tissue under top lip. Med change: off glipizide. INR therapeutic.  No change to Coumadin dose. Monitor nose bleeding.  If worsens in duration/frequency, call us. Kennith Center, Pharm.D., CPP 04/20/2015@3 :26 PM

## 2015-04-28 ENCOUNTER — Encounter: Payer: Self-pay | Admitting: Emergency Medicine

## 2015-04-28 ENCOUNTER — Ambulatory Visit (INDEPENDENT_AMBULATORY_CARE_PROVIDER_SITE_OTHER): Payer: Medicare Other | Admitting: Emergency Medicine

## 2015-04-28 VITALS — BP 128/84 | HR 59 | Ht 66.0 in | Wt 148.0 lb

## 2015-04-28 DIAGNOSIS — I270 Primary pulmonary hypertension: Secondary | ICD-10-CM | POA: Diagnosis not present

## 2015-04-28 DIAGNOSIS — G4733 Obstructive sleep apnea (adult) (pediatric): Secondary | ICD-10-CM | POA: Diagnosis not present

## 2015-04-28 DIAGNOSIS — I272 Pulmonary hypertension, unspecified: Secondary | ICD-10-CM

## 2015-04-28 NOTE — Progress Notes (Signed)
Subjective:    Patient ID: Douglas George, male    DOB: 08-Sep-1927, 81 y.o.   MRN: [id number]  HPI 81 yo former low exposure smoker (2 pk-yrs), hx DM, OSA (dx 15 yrs ago, not on CPAP),  LE DVT (his wife says these were while he was therapeutic on coumadin), HTN with restrictive diastolic dysfxn, CAD (RCA stent '98, LAD dz), moderate AI and MR, RV dilation and PAH by TTE in 2011 (estimated PASP 44mmHg) and then confirmed on TTE January 2/14 (estimated PASP 86 mmHg). Followed by Dr Percival Spanish for these issues and A fib on coumadin.  His most recent TTE was performed to evaluate LE edema, has improved since lasix was increased 1/16. He denies any dyspnea, although activity is limited by his joint disease.   ROV 03/25/13 -- follows up for secondary PAH in setting untreated OSA, chronic VTE, L sided heart disease (CAD, diastolic dysfxn, valvular disease). He underwent full PFT today >> Mild (but real) AFL without BD response, normal volumes, decrease DLCO. Last visit dsat to 90% after 2 laps on RA, no overt drop to <88. Sleep study done but not yet available.  V/Q read as normal on 03/02/13 (has been on anti-coagulation).  Auto-immune panel is negative (2/25).  He is wondering about whether he should get the R heart cath.   ROV 05/15/13 -- Follows for his secondary PAH with w/u and eval as above. He has had his CPAP titration study, results not yet available. Since last time he unfortunately was admitted in [facility] for UTI and heat exhaustion.   ROV 07/1913 -- secondary [place] identified on TTE last done 2/14. He underwent PSG, has started auto-set BiPAP (5-25), . Returns for follow up. Compliance data is available > shows that he is using > 4 hours 73 % of the time, used it every night. Data recommends starting with 16/12 but there was significant leakage. (SLE 1006 is mask fitting). He feels much better since starting the NIPPV. Less exertional SOB, no longer napping.   ROV 12/07/13 -- secondary PAH, OSA on BiPAP with  moderate compliance. Was seen last month by Dr Gwenette Greet with persistent UA cough. ? The inciting event, was treated w levaquin, seemed to be driven by his ramipril. His breathing is better, has improved since his diuretics were increased at Cardiology.    ROV 04/13/14 -- secondary PAH, OSA on BiPAP. Returns for f/u. Reports that he has been wearing biPAP reliably. He has been having allergies, itchy eyes, more mucous, scratchy throat. He is more SOB last 2 -3 days. He is scheduled for a pacer for A Fib with periods of brady. Suspect this contributes to his PAH. He has B LE edema for the last 3-4 days.    ROV 09/20/14 -- secondary PAH, OSA on BiPAP. He reports that his breathing has been doing fairly well - able to get around. He has good compliance with his BiPAP - uses between 4-7 hours most nights. He was briefly on Hosp[facility], but is now off of it so he will need to be back on [facility] for BiPAP supplies.    Acute OV -Secondary PAH, OSA on BiPAP 01/31/15 RB pt here for increased SOB, upper wheezing/tightness, hoarsess, increased throat clearing with yellow/spotted with dark red blood for few days . Coughing up very thick mucus. Has a lot of drainage in throat. Gums bleed on occasion,.  Saw mucus was mixed with blood a couple of times.  Denies any f/c/s, n/v/d, PND Has chronic  leg swelling, L>R. Noticed some redness along the left mid LE.  Wife marked the area but has not seen any increase in size.  No drainage or known injury.  On coumadin for previous DVT , atrial Fib , last INR 1.7 in Dec.  Pt followed at oncology/hematolgy for MDS .    ROV 04/28/15 -- follow up visit for OSA/OHS, BiPAP use for secondary PAH and chronic resp failure. He is having trouble with dry mouth and throat on BiPAP. 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       Objective:   Physical Exam Filed Vitals:   04/28/15 1501  BP: 128/84  Pulse: 59  Height: 5\' 6"  (1.676 m)  Weight: 148 lb (67.132  kg)  SpO2: 97%    .GEN: A/Ox3; pleasant ,  HEENT:  [place]/AT,  EACs-clear, TMs-wnl, NOSE-clear, THROAT-clear, no lesions, no postnasal drip or exudate noted.   NECK:  Supple w/ fair ROM; no JVD; normal carotid impulses w/o bruits; no thyromegaly or nodules palpated; no lymphadenopathy.  RESP  Decreased BS in bases , no accessory muscle use, no dullness to percussion  CARD:  RRR, no m/r/g  , 1+ peripheral edema L>R , pulses intact, no cyanosis or clubbing.  GI:   Soft & nt; nml bowel sounds; no organomegaly or masses detected.  Musco: Warm bil, no deformities or joint swelling noted.   Neuro: alert, no focal deficits noted.    Skin: Warm, mild redness along mid LE on left, no red streaking, warm to touch       Assessment & Plan:    Pulmonary hypertension We will arrange for you to get your BiPAP maintenance through [facility]. We will have them check your humidifier.  Restart fluticasone nasal spray. Use 2 sprays each evening a few hours before bedtime.  Continue your BiPAP every night Follow with Dr Lamonte Sakai in 6 months or sooner if you have any problems

## 2015-04-28 NOTE — Assessment & Plan Note (Signed)
We will arrange for you to get your BiPAP maintenance through [facility]. We will have them check your humidifier.  Restart fluticasone nasal spray. Use 2 sprays each evening a few hours before bedtime.  Continue your BiPAP every night Follow with Dr Lamonte Sakai in 6 months or sooner if you have any problems

## 2015-04-28 NOTE — Patient Instructions (Signed)
We will arrange for you to get your BiPAP maintenance through [facility]. We will have them check your humidifier.  Restart fluticasone nasal spray. Use 2 sprays each evening a few hours before bedtime.  Continue your BiPAP every night Follow with Dr Lamonte Sakai in 6 months or sooner if you have any problems

## 2015-05-02 ENCOUNTER — Telehealth: Payer: Self-pay | Admitting: Emergency Medicine

## 2015-05-02 NOTE — Telephone Encounter (Signed)
Please advise PCC;s thanks 

## 2015-05-02 NOTE — Telephone Encounter (Signed)
Called carol order sent to wrong place [facility] [place]

## 2015-05-03 ENCOUNTER — Encounter: Payer: Self-pay | Admitting: *Deleted

## 2015-05-03 NOTE — Telephone Encounter (Signed)
Can we sign off message? thanks

## 2015-05-11 ENCOUNTER — Ambulatory Visit (HOSPITAL_BASED_OUTPATIENT_CLINIC_OR_DEPARTMENT_OTHER): Payer: Medicare Other

## 2015-05-11 ENCOUNTER — Ambulatory Visit: Payer: Medicare Other | Admitting: Pharmacist

## 2015-05-11 ENCOUNTER — Other Ambulatory Visit (HOSPITAL_BASED_OUTPATIENT_CLINIC_OR_DEPARTMENT_OTHER): Payer: Medicare Other

## 2015-05-11 VITALS — BP 145/48 | HR 60 | Temp 98.3°F

## 2015-05-11 DIAGNOSIS — D469 Myelodysplastic syndrome, unspecified: Secondary | ICD-10-CM

## 2015-05-11 DIAGNOSIS — D631 Anemia in chronic kidney disease: Secondary | ICD-10-CM | POA: Diagnosis not present

## 2015-05-11 DIAGNOSIS — Z5181 Encounter for therapeutic drug level monitoring: Secondary | ICD-10-CM

## 2015-05-11 DIAGNOSIS — I82492 Acute embolism and thrombosis of other specified deep vein of left lower extremity: Secondary | ICD-10-CM

## 2015-05-11 DIAGNOSIS — N189 Chronic kidney disease, unspecified: Secondary | ICD-10-CM | POA: Diagnosis not present

## 2015-05-11 DIAGNOSIS — I82439 Acute embolism and thrombosis of unspecified popliteal vein: Secondary | ICD-10-CM

## 2015-05-11 DIAGNOSIS — D630 Anemia in neoplastic disease: Secondary | ICD-10-CM

## 2015-05-11 LAB — CBC WITH DIFFERENTIAL/PLATELET
BASO%: 1 % (ref 0.0–2.0)
Basophils Absolute: 0.1 10*3/uL (ref 0.0–0.1)
EOS ABS: 0.3 10*3/uL (ref 0.0–0.5)
EOS%: 4.6 % (ref 0.0–7.0)
HEMATOCRIT: 33.4 % — AB (ref 38.4–49.9)
HGB: 10.4 g/dL — ABNORMAL LOW (ref 13.0–17.1)
LYMPH#: 1.1 10*3/uL (ref 0.9–3.3)
LYMPH%: 15.2 % (ref 14.0–49.0)
MCH: 30.8 pg (ref 27.2–33.4)
MCHC: 31.1 g/dL — ABNORMAL LOW (ref 32.0–36.0)
MCV: 98.8 fL — ABNORMAL HIGH (ref 79.3–98.0)
MONO#: 1 10*3/uL — ABNORMAL HIGH (ref 0.1–0.9)
MONO%: 13.6 % (ref 0.0–14.0)
NEUT%: 65.6 % (ref 39.0–75.0)
NEUTROS ABS: 4.6 10*3/uL (ref 1.5–6.5)
PLATELETS: 160 10*3/uL (ref 140–400)
RBC: 3.38 10*6/uL — AB (ref 4.20–5.82)
RDW: 15.5 % — ABNORMAL HIGH (ref 11.0–14.6)
WBC: 7 10*3/uL (ref 4.0–10.3)
nRBC: 0 % (ref 0–0)

## 2015-05-11 LAB — PROTIME-INR
INR: 2.7 (ref 2.00–3.50)
PROTIME: 32.4 s — AB (ref 10.6–13.4)

## 2015-05-11 LAB — POCT INR: INR: 2.7

## 2015-05-11 MED ORDER — DARBEPOETIN ALFA 500 MCG/ML IJ SOSY
500.0000 ug | PREFILLED_SYRINGE | Freq: Once | INTRAMUSCULAR | Status: AC
  Administered 2015-05-11: 500 ug via SUBCUTANEOUS
  Filled 2015-05-11: qty 1

## 2015-05-11 NOTE — Progress Notes (Signed)
INR remains at goal on current coumadin dose.  H/H=10.4/33.4 Plt=160k.  Mr Heidrick received Aranesp today.  No changes in meds.  No bleeding or unusual bruising.  Will continue current coumadin dose of 3.75mg  on Wed and 2.5mg  other days.  Will check PT/INR in 3 weeks with next lab/inj appt.

## 2015-05-25 ENCOUNTER — Ambulatory Visit: Payer: Medicare Other

## 2015-05-25 ENCOUNTER — Telehealth: Payer: Self-pay | Admitting: Emergency Medicine

## 2015-05-25 ENCOUNTER — Other Ambulatory Visit: Payer: Medicare Other

## 2015-05-25 NOTE — Telephone Encounter (Signed)
Spoke with [name], emergency contact, and she states they want to use [facility] instead of [facility].

## 2015-05-26 NOTE — Telephone Encounter (Signed)
accroding to original order this was already faxed to [facility]. Then in another part of the order it states it was sent to [facility] Then it states again it was faxed to [facility].  Does another order need to be placed again for clarification/ thanks

## 2015-05-26 NOTE — Telephone Encounter (Signed)
Referral, Sleep Studies, ov notes and download compliance report faxed to [place] [facility]. Rhonda J Cobb

## 2015-05-26 NOTE — Telephone Encounter (Signed)
PCC's will you please make sure that patient can use [facility] with their insurance; if so please send message to Triage to have order placed for you all. Thanks.

## 2015-05-26 NOTE — Telephone Encounter (Signed)
Called and spoke with Douglas George and she is aware that the order has been re-faxed to [facility]. Nothing else needed at this time. Rhonda J Cobb

## 2015-05-26 NOTE — Telephone Encounter (Signed)
Called and spoke with [place] at [place] and she stated that Douglas George is in network with [employer]. Phone message sent back to triage as per requested. Rhonda J Cobb

## 2015-06-01 ENCOUNTER — Ambulatory Visit (HOSPITAL_BASED_OUTPATIENT_CLINIC_OR_DEPARTMENT_OTHER): Payer: Medicare Other

## 2015-06-01 ENCOUNTER — Other Ambulatory Visit (HOSPITAL_BASED_OUTPATIENT_CLINIC_OR_DEPARTMENT_OTHER): Payer: Medicare Other

## 2015-06-01 ENCOUNTER — Ambulatory Visit (HOSPITAL_BASED_OUTPATIENT_CLINIC_OR_DEPARTMENT_OTHER): Payer: Self-pay | Admitting: Pharmacist

## 2015-06-01 VITALS — BP 134/50 | HR 60 | Temp 98.4°F

## 2015-06-01 DIAGNOSIS — N189 Chronic kidney disease, unspecified: Secondary | ICD-10-CM

## 2015-06-01 DIAGNOSIS — I82492 Acute embolism and thrombosis of other specified deep vein of left lower extremity: Secondary | ICD-10-CM

## 2015-06-01 DIAGNOSIS — D630 Anemia in neoplastic disease: Secondary | ICD-10-CM

## 2015-06-01 DIAGNOSIS — D631 Anemia in chronic kidney disease: Secondary | ICD-10-CM

## 2015-06-01 DIAGNOSIS — D469 Myelodysplastic syndrome, unspecified: Secondary | ICD-10-CM | POA: Diagnosis not present

## 2015-06-01 DIAGNOSIS — I82439 Acute embolism and thrombosis of unspecified popliteal vein: Secondary | ICD-10-CM

## 2015-06-01 LAB — PROTIME-INR
INR: 2.4 (ref 2.00–3.50)
Protime: 28.8 Seconds — ABNORMAL HIGH (ref 10.6–13.4)

## 2015-06-01 LAB — CBC WITH DIFFERENTIAL/PLATELET
BASO%: 0.7 % (ref 0.0–2.0)
BASOS ABS: 0.1 10*3/uL (ref 0.0–0.1)
EOS%: 5.1 % (ref 0.0–7.0)
Eosinophils Absolute: 0.3 10*3/uL (ref 0.0–0.5)
HEMATOCRIT: 34.3 % — AB (ref 38.4–49.9)
HEMOGLOBIN: 10.8 g/dL — AB (ref 13.0–17.1)
LYMPH#: 0.9 10*3/uL (ref 0.9–3.3)
LYMPH%: 13.7 % — AB (ref 14.0–49.0)
MCH: 29.7 pg (ref 27.2–33.4)
MCHC: 31.5 g/dL — AB (ref 32.0–36.0)
MCV: 94.2 fL (ref 79.3–98.0)
MONO#: 0.9 10*3/uL (ref 0.1–0.9)
MONO%: 13.1 % (ref 0.0–14.0)
NEUT%: 67.4 % (ref 39.0–75.0)
NEUTROS ABS: 4.5 10*3/uL (ref 1.5–6.5)
Platelets: 139 10*3/uL — ABNORMAL LOW (ref 140–400)
RBC: 3.64 10*6/uL — AB (ref 4.20–5.82)
RDW: 16.1 % — AB (ref 11.0–14.6)
WBC: 6.7 10*3/uL (ref 4.0–10.3)
nRBC: 0 % (ref 0–0)

## 2015-06-01 LAB — POCT INR: INR: 2.4

## 2015-06-01 MED ORDER — DARBEPOETIN ALFA 500 MCG/ML IJ SOSY
500.0000 ug | PREFILLED_SYRINGE | Freq: Once | INTRAMUSCULAR | Status: AC
  Administered 2015-06-01: 500 ug via SUBCUTANEOUS
  Filled 2015-06-01: qty 1

## 2015-06-01 NOTE — Progress Notes (Signed)
INR continues to be at goal of 2-3.  Douglas George has stopped his magnesium, but no other med changes.  No bleeding or unusual bruising.  Douglas George comes to [facility] every 3 weeks for CBC and aranesp injection.  His INR has been stable on current dose of coumadin 3.75mg  Wed and 2.5mg  other days since he started coming to [facility] coumadin clinic in February 2016.  I will increase INR checks to 6 weeks.  Douglas George know to call sooner if questions or problems related to coumadin.

## 2015-06-17 ENCOUNTER — Other Ambulatory Visit: Payer: Self-pay | Admitting: Endocrinology

## 2015-06-22 ENCOUNTER — Other Ambulatory Visit: Payer: Self-pay | Admitting: Hematology and Oncology

## 2015-06-22 ENCOUNTER — Ambulatory Visit: Payer: Medicare Other

## 2015-06-22 ENCOUNTER — Other Ambulatory Visit (HOSPITAL_BASED_OUTPATIENT_CLINIC_OR_DEPARTMENT_OTHER): Payer: Medicare Other

## 2015-06-22 DIAGNOSIS — D631 Anemia in chronic kidney disease: Secondary | ICD-10-CM

## 2015-06-22 DIAGNOSIS — D630 Anemia in neoplastic disease: Secondary | ICD-10-CM

## 2015-06-22 DIAGNOSIS — N189 Chronic kidney disease, unspecified: Secondary | ICD-10-CM | POA: Diagnosis not present

## 2015-06-22 DIAGNOSIS — D469 Myelodysplastic syndrome, unspecified: Secondary | ICD-10-CM

## 2015-06-22 LAB — CBC & DIFF AND RETIC
BASO%: 0.5 % (ref 0.0–2.0)
Basophils Absolute: 0 10*3/uL (ref 0.0–0.1)
EOS%: 3.2 % (ref 0.0–7.0)
Eosinophils Absolute: 0.2 10*3/uL (ref 0.0–0.5)
HEMATOCRIT: 35.3 % — AB (ref 38.4–49.9)
HEMOGLOBIN: 11 g/dL — AB (ref 13.0–17.1)
IMMATURE RETIC FRACT: 3.6 % (ref 3.00–10.60)
LYMPH#: 0.9 10*3/uL (ref 0.9–3.3)
LYMPH%: 11.5 % — ABNORMAL LOW (ref 14.0–49.0)
MCH: 29.1 pg (ref 27.2–33.4)
MCHC: 31.2 g/dL — ABNORMAL LOW (ref 32.0–36.0)
MCV: 93.4 fL (ref 79.3–98.0)
MONO#: 0.9 10*3/uL (ref 0.1–0.9)
MONO%: 11.9 % (ref 0.0–14.0)
NEUT#: 5.5 10*3/uL (ref 1.5–6.5)
NEUT%: 72.9 % (ref 39.0–75.0)
Platelets: 166 10*3/uL (ref 140–400)
RBC: 3.78 10*6/uL — AB (ref 4.20–5.82)
RDW: 17.2 % — AB (ref 11.0–14.6)
RETIC %: 0.9 % (ref 0.80–1.80)
Retic Ct Abs: 34.02 10*3/uL — ABNORMAL LOW (ref 34.80–93.90)
WBC: 7.5 10*3/uL (ref 4.0–10.3)

## 2015-06-22 MED ORDER — DARBEPOETIN ALFA 500 MCG/ML IJ SOSY: 500.0000 ug | PREFILLED_SYRINGE | Freq: Once | INTRAMUSCULAR | Status: DC

## 2015-06-27 ENCOUNTER — Other Ambulatory Visit: Payer: Self-pay

## 2015-06-30 ENCOUNTER — Ambulatory Visit (INDEPENDENT_AMBULATORY_CARE_PROVIDER_SITE_OTHER): Payer: Medicare Other | Admitting: Internal Medicine

## 2015-06-30 ENCOUNTER — Encounter: Payer: Self-pay | Admitting: Internal Medicine

## 2015-06-30 VITALS — BP 126/70 | HR 60 | Ht 66.0 in | Wt 149.0 lb

## 2015-06-30 DIAGNOSIS — I482 Chronic atrial fibrillation, unspecified: Secondary | ICD-10-CM

## 2015-06-30 DIAGNOSIS — I442 Atrioventricular block, complete: Secondary | ICD-10-CM

## 2015-06-30 DIAGNOSIS — I10 Essential (primary) hypertension: Secondary | ICD-10-CM

## 2015-06-30 DIAGNOSIS — I259 Chronic ischemic heart disease, unspecified: Secondary | ICD-10-CM

## 2015-06-30 DIAGNOSIS — Z950 Presence of cardiac pacemaker: Secondary | ICD-10-CM | POA: Diagnosis not present

## 2015-06-30 LAB — CUP PACEART INCLINIC DEVICE CHECK
Battery Remaining Longevity: 126 mo
Battery Voltage: 3.04 V
Brady Statistic RV Percent Paced: 88 %
Date Time Interrogation Session: 20160630111319
Lead Channel Impedance Value: 462.5 Ohm
Lead Channel Pacing Threshold Amplitude: 0.75 V
Lead Channel Setting Pacing Pulse Width: 0.5 ms
MDC IDC MSMT LEADCHNL RV PACING THRESHOLD PULSEWIDTH: 0.5 ms
MDC IDC MSMT LEADCHNL RV SENSING INTR AMPL: 12 mV
MDC IDC SET LEADCHNL RV PACING AMPLITUDE: 2.5 V
MDC IDC SET LEADCHNL RV SENSING SENSITIVITY: 2 mV
Pulse Gen Model: 1160
Pulse Gen Serial Number: 7505109

## 2015-06-30 NOTE — Patient Instructions (Signed)
Medication Instructions:  Your physician recommends that you continue on your current medications as directed. Please refer to the Current Medication list given to you today.   Labwork: None ordered  Testing/Procedures: None ordered  Follow-Up: Your physician wants you to follow-up in: 12 months with Dr Knox Saliva will receive a reminder letter in the mail two months in advance. If you don't receive a letter, please call our office to schedule the follow-up appointment.  Remote monitoring is used to monitor your Pacemaker or ICD from home. This monitoring reduces the number of office visits required to check your device to one time per year. It allows [place] to keep an eye on the functioning of your device to ensure it is working properly. You are scheduled for a device check from home on 09/28/15. You may send your transmission at any time that day. If you have a wireless device, the transmission will be sent automatically. After your physician reviews your transmission, you will receive a postcard with your next transmission date.    Any Other Special Instructions Will Be Listed Below (If Applicable).

## 2015-06-30 NOTE — Assessment & Plan Note (Signed)
HIs blood pressure is well controlled. No change in meds.

## 2015-06-30 NOTE — Progress Notes (Signed)
HPI [name] returns today for ongoing PPM treatment in the setting of atrial fibrillation with intermittant CHB which was associated with dizziness. The patient is a pleasant 81 yo man with a h/o chronic atrial fibrillation, HTN, and chronic venous insufficiency resulting in peripheral edema, refractory to medical therapy.The patient has never had frank syncope. He does have severe fatigue. He has reduced the dose of his medications and his blood pressure is controlled. His dizzy spells improved significantly after PPM insertion. In the interim he has become more sedentary due to his arthritis and multiple comorbidities. No other symptoms. Allergies  Allergen Reactions  . Keflex [Cephalexin] Other (See Comments)    Paranoid ..  . Codeine Nausea Only  . Ramipril Cough  . Vancomycin Nausea Only     Current Outpatient Prescriptions  Medication Sig Dispense Refill  . ACCU-CHEK AVIVA PLUS test strip USE ONCE DAILY AS DIRECTED 50 each 5  . acetaminophen (TYLENOL) 500 MG tablet Take 1,000 mg by mouth every 8 (eight) hours as needed for moderate pain or headache.     . Calcium Carbonate-Vit D-Min (CALCIUM 1200 PO) Take 1,200 mg by mouth daily.     . Cholecalciferol (VITAMIN D) 1000 UNITS capsule Take 1,000 Units by mouth daily.     . clotrimazole-betamethasone (LOTRISONE) cream Apply 1 application topically daily.    Marland Kitchen docusate sodium (COLACE) 100 MG capsule Take 100 mg by mouth 2 (two) times daily.    Marland Kitchen doxazosin (CARDURA) 1 MG tablet Take 1 mg by mouth daily.    . Flaxseed, Linseed, (FLAX SEED OIL PO) Take by mouth.    . fluorouracil (EFUDEX) 5 % cream   2  . furosemide (LASIX) 80 MG tablet TAKE 1 TABLET BY MOUTH DAILY 30 tablet 5  . levothyroxine (SYNTHROID, LEVOTHROID) 150 MCG tablet TAKE 1 TABLET BY MOUTH EVERY MORNING ON AN EMPTY STOMACH 30 tablet 5  . losartan (COZAAR) 50 MG tablet Take 25 mg by mouth daily.    . Magnesium 300 MG CAPS Take 300 mg by mouth.    . Misc Natural  Products (OSTEO BI-FLEX JOINT SHIELD PO) Take 1 tablet by mouth 2 (two) times daily.     Marland Kitchen oxybutynin (DITROPAN) 5 MG tablet Take 5 mg by mouth every 6 (six) hours as needed for bladder spasms.     . polyethylene glycol (MIRALAX / GLYCOLAX) packet Take 17 g by mouth daily.    . Pyridoxine HCl (VITAMIN B-6) 250 MG tablet Take 250 mg by mouth daily.    Marland Kitchen tobramycin (TOBREX) 0.3 % ophthalmic solution Place 1 drop into both eyes 2 (two) times daily.    . vitamin B-12 (CYANOCOBALAMIN) 1000 MCG tablet Take 1,000 mcg by mouth daily.    . vitamin C (ASCORBIC ACID) 500 MG tablet Take 500 mg by mouth daily.    Marland Kitchen warfarin (COUMADIN) 2.5 MG tablet Take as directed by anticoagulation clinic 35 tablet 3   No current facility-administered medications for this visit.     Past Medical History  Diagnosis Date  . Hearing loss   . Skin change   . Chronic atrial fibrillation   . Diabetes mellitus type II, controlled   . Hyperlipidemia   . Depression   . Ischemic heart disease     remote stenting of the RCA in 1998 with residual LAD disease of 60 to 70% with negative nuclear in 2011  . Pulmonary hypertension     per echo in 2011  .  Diverticulitis   . H/O blood clots 1990's    in L leg (related to being in a cast for surgery)  . Thrombocytopenia   . Anal fissure   . Neck mass 10/19/2013  . MDS (myelodysplastic syndrome) 12/11/2013  . CHF (congestive heart failure)   . Myocardial infarction 1999  . DVT (deep venous thrombosis) ~ 2009    "LLE"  . Pneumonia ~ 11/2013    "tx'd; not really sure if he had it or not"  . OSA (obstructive sleep apnea)     BiPAP  . Hypothyroidism   . Anemia in chronic kidney disease(285.21) 11/13/2013  . GERD (gastroesophageal reflux disease)   . Arthritis     "joints" (04/21/2014)  . Spinal stenosis, lumbar   . Gout     "little bit in the left foot"  . Complete heart block     s/p single chamber PPM implantation April 2015 ([place])    ROS:   All systems  reviewed and negative except as noted in the HPI.   Past Surgical History  Procedure Laterality Date  . Thyroidectomy  1960  . Wrist surgery Left ~ 2003    "cut the radial artery"  . Achilles tendon repair Left ~ 2002  . Shrapnel Left     "left shoulder; left hip" [place]   . Inguinal hernia repair Right 1980  . Cataract extraction w/ intraocular lens  implant, bilateral Bilateral ~ 2007  . Vasectomy    . Colonoscopy  2009    polyps in the past  . Insert / replace / remove pacemaker  04/21/2014    single chamber PPM ([place])  . Coronary angioplasty with stent placement  1999  . Tonsillectomy and adenoidectomy  1930's  . Pocket evacuation  05-11-2014    pocket hematoma evacuation by Dr Lovena Le  . Permanent pacemaker insertion N/A 04/21/2014    Procedure: PERMANENT PACEMAKER INSERTION;  Surgeon: Evans Lance, MD;  Location: [facility] C[facility] LAB;  Service: Cardiovascular;  Laterality: N/A;  . Pocket revision N/A 05/11/2014    Procedure: POCKET REVISION/HEMATOMA REMOVAL;  Surgeon: Evans Lance, MD;  Location: [facility] CATH LAB;  Service: Cardiovascular;  Laterality: N/A;     Family History  Problem Relation Age of Onset  . Heart failure Mother 35  . Diabetes Mother 84  . Heart attack Father 49  . Stroke Father 32  . Aortic aneurysm Father 46    ABDOMINAL  . Cancer Son     ?  Marland Kitchen Hepatitis Son   . Diabetes Brother      History   Social History  . Marital Status: Married    Spouse Name: N/A  . Number of Children: 6  . Years of Education: 16+   Occupational History  . Retired     Pharmacist, hospital   Social History Main Topics  . Smoking status: Former Smoker -- 2.00 packs/day for 1 years    Types: Cigarettes    Quit date: 01/01/1952  . Smokeless tobacco: Never Used  . Alcohol Use: 4.2 oz/week    7 Glasses of wine per week  . Drug Use: No  . Sexual Activity: Yes   Other Topics Concern  . Not on file   Social History Narrative   No caffeine use   Regular exercise-no      BP 126/70 mmHg  Pulse 60  Ht 5\' 6"  (1.676 m)  Wt 149 lb (67.586 kg)  BMI 24.06 kg/m2  Physical Exam:  stable  appearing frail, elderly man, NAD HEENT: Unremarkable Neck:  6 cm JVD, no thyromegally Back:  No CVA tenderness Lungs:  Clear with no wheezes HEART:  Regular  Rhythm  2/6 systolic murmur c/w MR, no rubs, no clicks Abd:  soft, positive bowel sounds, no organomegally, no rebound, no guarding Ext:  2 plus pulses, no edema, no cyanosis, no clubbing Skin:  No rashes no nodules Neuro:  CN II through XII intact, motor grossly intact  PPM - normal device function.   Assess/Plan:

## 2015-06-30 NOTE — Assessment & Plan Note (Signed)
He has had no anginal symptoms. He will continue his current meds.

## 2015-06-30 NOTE — Assessment & Plan Note (Signed)
His ventricular rate is well controlled. No change in medications.

## 2015-06-30 NOTE — Assessment & Plan Note (Signed)
His St. Jude single chamber is working normally. Will recheck in several months.

## 2015-07-13 ENCOUNTER — Other Ambulatory Visit (HOSPITAL_BASED_OUTPATIENT_CLINIC_OR_DEPARTMENT_OTHER): Payer: Medicare Other

## 2015-07-13 ENCOUNTER — Ambulatory Visit: Payer: Medicare Other | Admitting: Pharmacist

## 2015-07-13 ENCOUNTER — Ambulatory Visit (HOSPITAL_BASED_OUTPATIENT_CLINIC_OR_DEPARTMENT_OTHER): Payer: Medicare Other

## 2015-07-13 VITALS — BP 140/35 | HR 60 | Temp 97.7°F

## 2015-07-13 DIAGNOSIS — D631 Anemia in chronic kidney disease: Secondary | ICD-10-CM | POA: Diagnosis not present

## 2015-07-13 DIAGNOSIS — Z5181 Encounter for therapeutic drug level monitoring: Secondary | ICD-10-CM

## 2015-07-13 DIAGNOSIS — N189 Chronic kidney disease, unspecified: Secondary | ICD-10-CM

## 2015-07-13 DIAGNOSIS — I82492 Acute embolism and thrombosis of other specified deep vein of left lower extremity: Secondary | ICD-10-CM

## 2015-07-13 DIAGNOSIS — D630 Anemia in neoplastic disease: Secondary | ICD-10-CM

## 2015-07-13 DIAGNOSIS — I82439 Acute embolism and thrombosis of unspecified popliteal vein: Secondary | ICD-10-CM

## 2015-07-13 DIAGNOSIS — D469 Myelodysplastic syndrome, unspecified: Secondary | ICD-10-CM

## 2015-07-13 LAB — POCT INR: INR: 2.2

## 2015-07-13 LAB — CBC & DIFF AND RETIC
BASO%: 0.8 % (ref 0.0–2.0)
Basophils Absolute: 0.1 10*3/uL (ref 0.0–0.1)
EOS%: 5.4 % (ref 0.0–7.0)
Eosinophils Absolute: 0.4 10*3/uL (ref 0.0–0.5)
HEMATOCRIT: 32 % — AB (ref 38.4–49.9)
HGB: 10.2 g/dL — ABNORMAL LOW (ref 13.0–17.1)
IMMATURE RETIC FRACT: 1.5 % — AB (ref 3.00–10.60)
LYMPH%: 12.7 % — AB (ref 14.0–49.0)
MCH: 29.2 pg (ref 27.2–33.4)
MCHC: 31.9 g/dL — ABNORMAL LOW (ref 32.0–36.0)
MCV: 91.7 fL (ref 79.3–98.0)
MONO#: 0.9 10*3/uL (ref 0.1–0.9)
MONO%: 10.8 % (ref 0.0–14.0)
NEUT%: 70.3 % (ref 39.0–75.0)
NEUTROS ABS: 5.6 10*3/uL (ref 1.5–6.5)
PLATELETS: 188 10*3/uL (ref 140–400)
RBC: 3.49 10*6/uL — ABNORMAL LOW (ref 4.20–5.82)
RDW: 19.4 % — ABNORMAL HIGH (ref 11.0–14.6)
Retic %: 0.44 % — ABNORMAL LOW (ref 0.80–1.80)
Retic Ct Abs: 15.36 10*3/uL — ABNORMAL LOW (ref 34.80–93.90)
WBC: 7.9 10*3/uL (ref 4.0–10.3)
lymph#: 1 10*3/uL (ref 0.9–3.3)
nRBC: 0 % (ref 0–0)

## 2015-07-13 LAB — PROTIME-INR
INR: 2.2 (ref 2.00–3.50)
Protime: 26.4 Seconds — ABNORMAL HIGH (ref 10.6–13.4)

## 2015-07-13 MED ORDER — DARBEPOETIN ALFA 500 MCG/ML IJ SOSY
500.0000 ug | PREFILLED_SYRINGE | Freq: Once | INTRAMUSCULAR | Status: AC
  Administered 2015-07-13: 500 ug via SUBCUTANEOUS
  Filled 2015-07-13: qty 1

## 2015-07-13 NOTE — Progress Notes (Signed)
INR within goal today. Hg/Hct: 10.2/32, Pltc = 188 No problems or concerns regarding anticoagulation to report. No unusual bruising. No bleeding noted. No s/s of clotting. No changes in diet or medications. No missed or extra coumadin doses. Continue Coumadin 2.5mg  (1 tablet) daily, except 3.75 mg (1&1/2 tablets) on Wednesdays.  Recheck INR on 08/24/15; lab at 1:00 pm, injection at 1:30pm, then Coumadin clinic at 2pm. No charge encounter.

## 2015-07-13 NOTE — Patient Instructions (Signed)
Continue Coumadin 2.5mg  (1 tablet) daily, except 3.75 mg (1&1/2 tablets) on Wednesdays.  Recheck INR on 08/24/15; lab at 1:00 pm, injection at 1:30pm, then Coumadin clinic at 2pm.

## 2015-07-14 ENCOUNTER — Encounter: Payer: Self-pay | Admitting: Internal Medicine

## 2015-07-20 ENCOUNTER — Other Ambulatory Visit: Payer: Medicare Other

## 2015-07-20 ENCOUNTER — Ambulatory Visit: Payer: Medicare Other

## 2015-08-02 ENCOUNTER — Telehealth: Payer: Self-pay | Admitting: Pharmacist

## 2015-08-02 ENCOUNTER — Emergency Department (HOSPITAL_COMMUNITY): Payer: Medicare Other

## 2015-08-02 ENCOUNTER — Telehealth: Payer: Self-pay | Admitting: *Deleted

## 2015-08-02 ENCOUNTER — Emergency Department (HOSPITAL_COMMUNITY)
Admission: EM | Admit: 2015-08-02 | Discharge: 2015-08-02 | Disposition: A | Discharge status: Home / Self Care | Payer: Medicare Other | Attending: Emergency Medicine | Admitting: Emergency Medicine

## 2015-08-02 ENCOUNTER — Encounter (HOSPITAL_COMMUNITY): Payer: Self-pay

## 2015-08-02 ENCOUNTER — Telehealth: Payer: Self-pay | Admitting: Internal Medicine

## 2015-08-02 DIAGNOSIS — Z79899 Other long term (current) drug therapy: Secondary | ICD-10-CM | POA: Insufficient documentation

## 2015-08-02 DIAGNOSIS — R791 Abnormal coagulation profile: Secondary | ICD-10-CM | POA: Insufficient documentation

## 2015-08-02 DIAGNOSIS — M1990 Unspecified osteoarthritis, unspecified site: Secondary | ICD-10-CM | POA: Diagnosis not present

## 2015-08-02 DIAGNOSIS — N189 Chronic kidney disease, unspecified: Secondary | ICD-10-CM | POA: Insufficient documentation

## 2015-08-02 DIAGNOSIS — G4733 Obstructive sleep apnea (adult) (pediatric): Secondary | ICD-10-CM | POA: Diagnosis not present

## 2015-08-02 DIAGNOSIS — H9222 Otorrhagia, left ear: Secondary | ICD-10-CM | POA: Diagnosis not present

## 2015-08-02 DIAGNOSIS — I252 Old myocardial infarction: Secondary | ICD-10-CM | POA: Insufficient documentation

## 2015-08-02 DIAGNOSIS — E039 Hypothyroidism, unspecified: Secondary | ICD-10-CM | POA: Insufficient documentation

## 2015-08-02 DIAGNOSIS — I509 Heart failure, unspecified: Secondary | ICD-10-CM | POA: Diagnosis not present

## 2015-08-02 DIAGNOSIS — Z87891 Personal history of nicotine dependence: Secondary | ICD-10-CM | POA: Diagnosis not present

## 2015-08-02 DIAGNOSIS — Z8701 Personal history of pneumonia (recurrent): Secondary | ICD-10-CM | POA: Insufficient documentation

## 2015-08-02 DIAGNOSIS — H9190 Unspecified hearing loss, unspecified ear: Secondary | ICD-10-CM | POA: Diagnosis not present

## 2015-08-02 DIAGNOSIS — N390 Urinary tract infection, site not specified: Secondary | ICD-10-CM | POA: Diagnosis not present

## 2015-08-02 DIAGNOSIS — M109 Gout, unspecified: Secondary | ICD-10-CM | POA: Insufficient documentation

## 2015-08-02 DIAGNOSIS — Z8659 Personal history of other mental and behavioral disorders: Secondary | ICD-10-CM | POA: Insufficient documentation

## 2015-08-02 DIAGNOSIS — I482 Chronic atrial fibrillation: Secondary | ICD-10-CM | POA: Diagnosis not present

## 2015-08-02 DIAGNOSIS — Z9981 Dependence on supplemental oxygen: Secondary | ICD-10-CM | POA: Diagnosis not present

## 2015-08-02 DIAGNOSIS — Z86718 Personal history of other venous thrombosis and embolism: Secondary | ICD-10-CM | POA: Insufficient documentation

## 2015-08-02 DIAGNOSIS — K219 Gastro-esophageal reflux disease without esophagitis: Secondary | ICD-10-CM | POA: Insufficient documentation

## 2015-08-02 DIAGNOSIS — R4182 Altered mental status, unspecified: Secondary | ICD-10-CM | POA: Diagnosis not present

## 2015-08-02 DIAGNOSIS — Z7901 Long term (current) use of anticoagulants: Secondary | ICD-10-CM | POA: Insufficient documentation

## 2015-08-02 DIAGNOSIS — E119 Type 2 diabetes mellitus without complications: Secondary | ICD-10-CM | POA: Insufficient documentation

## 2015-08-02 LAB — URINE MICROSCOPIC-ADD ON

## 2015-08-02 LAB — BASIC METABOLIC PANEL
ANION GAP: 8 (ref 5–15)
BUN: 58 mg/dL — ABNORMAL HIGH (ref 6–20)
CHLORIDE: 105 mmol/L (ref 101–111)
CO2: 23 mmol/L (ref 22–32)
CREATININE: 1.76 mg/dL — AB (ref 0.61–1.24)
Calcium: 8.9 mg/dL (ref 8.9–10.3)
GFR, EST AFRICAN AMERICAN: 39 mL/min — AB (ref >60)
GFR, EST NON AFRICAN AMERICAN: 33 mL/min — AB (ref >60)
Glucose, Bld: 151 mg/dL — ABNORMAL HIGH (ref 65–99)
Potassium: 5.2 mmol/L — ABNORMAL HIGH (ref 3.5–5.1)
SODIUM: 136 mmol/L (ref 135–145)

## 2015-08-02 LAB — CBC WITH DIFFERENTIAL/PLATELET
Basophils Absolute: 0 10*3/uL (ref 0.0–0.1)
Basophils Relative: 1 % (ref 0–1)
Eosinophils Absolute: 0.3 10*3/uL (ref 0.0–0.7)
Eosinophils Relative: 6 % — ABNORMAL HIGH (ref 0–5)
HCT: 33.5 % — ABNORMAL LOW (ref 39.0–52.0)
HEMOGLOBIN: 10.3 g/dL — AB (ref 13.0–17.0)
Lymphocytes Relative: 13 % (ref 12–46)
Lymphs Abs: 0.7 10*3/uL (ref 0.7–4.0)
MCH: 28.6 pg (ref 26.0–34.0)
MCHC: 30.7 g/dL (ref 30.0–36.0)
MCV: 93.1 fL (ref 78.0–100.0)
MONOS PCT: 12 % (ref 3–12)
Monocytes Absolute: 0.7 10*3/uL (ref 0.1–1.0)
Neutro Abs: 3.9 10*3/uL (ref 1.7–7.7)
Neutrophils Relative %: 68 % (ref 43–77)
PLATELETS: 170 10*3/uL (ref 150–400)
RBC: 3.6 MIL/uL — AB (ref 4.22–5.81)
RDW: 20.5 % — ABNORMAL HIGH (ref 11.5–15.5)
WBC: 5.7 10*3/uL (ref 4.0–10.5)

## 2015-08-02 LAB — TYPE AND SCREEN
ABO/RH(D): O POS
Antibody Screen: NEGATIVE

## 2015-08-02 LAB — ABO/RH: ABO/RH(D): O POS

## 2015-08-02 LAB — URINALYSIS, ROUTINE W REFLEX MICROSCOPIC
Bilirubin Urine: NEGATIVE
Glucose, UA: NEGATIVE mg/dL
Hgb urine dipstick: NEGATIVE
Ketones, ur: NEGATIVE mg/dL
Nitrite: NEGATIVE
Protein, ur: NEGATIVE mg/dL
Specific Gravity, Urine: 1.011 (ref 1.005–1.030)
Urobilinogen, UA: 0.2 mg/dL (ref 0.0–1.0)
pH: 5.5 (ref 5.0–8.0)

## 2015-08-02 LAB — PROTIME-INR
INR: 7.03 — AB (ref 0.00–1.49)
Prothrombin Time: 58 seconds — ABNORMAL HIGH (ref 11.6–15.2)

## 2015-08-02 MED ORDER — VITAMIN K1 10 MG/ML IJ SOLN
5.0000 mg | Freq: Once | INTRAMUSCULAR | Status: AC
  Administered 2015-08-02: 5 mg via INTRAVENOUS
  Filled 2015-08-02: qty 0.5

## 2015-08-02 MED ORDER — SODIUM CHLORIDE 0.9 % IV BOLUS (SEPSIS)
500.0000 mL | Freq: Once | INTRAVENOUS | Status: AC
  Administered 2015-08-02: 500 mL via INTRAVENOUS

## 2015-08-02 MED ORDER — CEPHALEXIN 500 MG PO CAPS: 500.0000 mg | ORAL_CAPSULE | Freq: Two times a day (BID) | ORAL | Status: DC

## 2015-08-02 MED ORDER — FUROSEMIDE 10 MG/ML IJ SOLN
40.0000 mg | INTRAMUSCULAR | Status: AC
  Administered 2015-08-02: 40 mg via INTRAVENOUS
  Filled 2015-08-02: qty 4

## 2015-08-02 NOTE — ED Notes (Signed)
Pt ambulated with walker per his norm.

## 2015-08-02 NOTE — Telephone Encounter (Signed)
Patient Name: IAAN OREGEL  DOB: [birth year]    Initial Comment Caller states her husband has bleeding coming from his left ear   Nurse Assessment  Nurse: Julien Girt, RN, Almyra Free Date/Time Eilene Ghazi Time): 08/02/2015 8:44:40 AM  Confirm and document reason for call. If symptomatic, describe symptoms. ---Caller states her husband has bleeding coming from his left ear. States it started on Sunday and has been constant. States he has leukemia and takes blood thinners. Denies pain, fever or known injury.  Has the patient traveled out of the country within the last 30 days? ---Not Applicable  Does the patient require triage? ---Yes  Related visit to physician within the last 2 weeks? ---No  Does the PT have any chronic conditions? (i.e. diabetes, asthma, etc.) ---Yes  List chronic conditions. ---Hx DVT- (warfarin) Contained leukemia, diabetes, htn     Guidelines    Guideline Title Affirmed Question Affirmed Notes  Ear - Discharge [1] Unexplained bleeding AND [2] lasts > 10 minutes or large amount (Exception: if a few drops of blood, continue with triage)    Final Disposition User   Go to ED Now Julien Girt, RN, Almyra Free    Referrals  [facility] - ED   Disagree/Comply: Comply

## 2015-08-02 NOTE — ED Notes (Signed)
Per pt, woke up Sunday with pillow saturated with blood.  Pt is on coumadin for a-fib. Family thought to be minor injury and cleaned ear.  On Monday, ear was packed with cotton and bleeding went minimal.  Today, woke up again with bleeding on pillow. Pt does not recall scratching ear.  No pain/discomfort to ear prior to event.  Next coumadin check was to be tomorrow at cancer center.  Pt does wear cpap machine at night but has worn it for a couple of years.

## 2015-08-02 NOTE — Telephone Encounter (Signed)
Returned patient's daughter's call from [facility] ED.   Patient is currently in the ED with bleeding from his ear and INR = 7. He has been given Vitamin K in the ED and the ED physician has requested that we check his INR in the Coumadin clinic tomorrow. He has been on chronic Coumadin for over 30 years and had been stable and within the therapeutic range recently. His daughter states that he was started on "something for year" (fluconazole) and the ED physician told her this caused the rise in INR. Per the patient's medication history, fluconazole 100 mg daily was started on 7/21. The ED physician would like [place] to check Douglas George's INR tomorrow when he is here for an injection appt. We will see him at 1:45pm.

## 2015-08-02 NOTE — Discharge Instructions (Signed)
Elevated INR-hold your warfarin dose for today and tomorrow and follow-up with oncology, suspect he will need to hold her urine for 2-3 days UTI-please follow up with [facility]. I have called and discussed our urinalysis findings with them, and they will be following up on the culture results with by mouth. ENT referral was given follow-up with him at your convenience  Urinary Tract Infection A urinary tract infection (UTI) can occur any place along the urinary tract. The tract includes the kidneys, ureters, bladder, and urethra. A type of germ called bacteria often causes a UTI. UTIs are often helped with antibiotic medicine.  HOME CARE   If given, take antibiotics as told by your doctor. Finish them even if you start to feel better.  Drink enough fluids to keep your pee (urine) clear or pale yellow.  Avoid tea, drinks with caffeine, and bubbly (carbonated) drinks.  Pee often. Avoid holding your pee in for a long time.  Pee before and after having sex (intercourse).  Wipe from front to back after you poop (bowel movement) if you are a woman. Use each tissue only once. GET HELP RIGHT AWAY IF:   You have back pain.  You have lower belly (abdominal) pain.  You have chills.  You feel sick to your stomach (nauseous).  You throw up (vomit).  Your burning or discomfort with peeing does not go away.  You have a fever.  Your symptoms are not better in 3 days. MAKE SURE YOU:   Understand these instructions.  Will watch your condition.  Will get help right away if you are not doing well or get worse. Document Released: 06/04/2008 Document Revised: 09/10/2012 Document Reviewed: 07/17/2012 [facility] Patient Information 2015 [place], [place]. This information is not intended to replace advice given to you by your health care provider. Make sure you discuss any questions you have with your health care provider.  Warfarin Coagulopathy Warfarin (Coumadin) coagulopathy refers to  bleeding that may occur as a complication of the medicine warfarin. Warfarin is an oral blood thinner (anticoagulant). Warfarin is used for medical conditions where thinning of the blood is needed to prevent blood clots.  CAUSES Bleeding is the most common and most serious complication of warfarin. The amount of bleeding is related to the warfarin dose and length of treatment. In addition, bleeding complications can also occur due to:  Intentional or accidental warfarin overdose.  Underlying medical conditions.  Dietary changes.  Medicine, herbal, supplement, or alcohol interactions. SYMPTOMS Severe bleeding while on warfarin may occur from any tissue or organ. Symptoms of the blood being too thin may include:  Bleeding from the nose or gums.  Blood in bowel movements which may appear as bright red, dark, or black tarry stools.  Blood in the urine which may appear as pink, red, or brown urine.  Unusual bruising or bruising easily.  A cut that does not stop bleeding within 10 minutes.  Vomiting blood or continuous nausea for more than 1 day.  Coughing up blood.  Broken blood vessels in your eye (subconjunctival hemorrhage).  Abdominal or back pain with or without flank bruising.  Sudden, severe headache.  Sudden weakness or numbness of the face, arm, or leg, especially on one side of the body.  Sudden confusion.  Trouble speaking (aphasia) or understanding.  Sudden trouble seeing in one or both eyes.  Sudden trouble walking.  Dizziness.  Loss of balance or coordination.  Vaginal bleeding.  Swelling or pain at an injection site.  Superficial  fat tissue death (necrosis) which may cause skin scarring. This is more common in women and may first present as pain in the waist, thighs, or buttocks.  Fever. HOME CARE INSTRUCTIONS  Always contact your health care provider of any concerns or signs of possible warfarin coagulopathy as soon as possible.  Take warfarin  exactly as directed by your health care provider. It is recommended that you take your warfarin dose at the same time of the day. If you have been told to stop taking warfarin, do not resume taking warfarin until directed to do so by your health care provider. Follow your health care provider's instructions if you accidentally take an extra dose or miss a dose of warfarin. It is very important to take warfarin as directed since bleeding or blood clots could result in chronic or permanent injury, pain, or disability.  Keep all follow-up appointments with your health care provider as directed. It is very important to keep your appointments. Not keeping appointments could result in a chronic or permanent injury, pain, or disability because warfarin is a medicine that requires close monitoring.  While taking warfarin, you will need to have regular blood tests to measure your blood clotting time. These blood tests usually include both the prothrombin time (PT) and International Normalized Ratio (INR) tests. The PT and INR results allow your health care provider to adjust your dose of warfarin. The dose can change for many reasons. It is critically important that you have your PT and INR levels drawn exactly as directed. Your warfarin dose may stay the same or change depending on what the PT and INR results are. Be sure to follow up with your health care provider regarding your PT and INR test results and what your warfarin dosage should be.  Many medicines can interfere with warfarin and affect the PT and INR results. You must tell your health care provider about any and all medicines you take. This includes all vitamins and supplements. Ask your health care provider before taking these. Prescription and over-the-counter medicine consistency is critical to warfarin management. It is important that potential interactions are checked before you start a new medicine. Be especially cautious with aspirin and  anti-inflammatory medicines. Ask your health care provider before taking these. Medicines such as antibiotics and acid-reducing medicine can interact with warfarin and can cause an increased warfarin effect. Warfarin can also interfere with the effectiveness of medicines you are taking. Do not take or discontinue any prescribed or over-the-counter medicine except on the advice of your health care provider or pharmacist.  Some vitamins, supplements, and herbal products interfere with the effectiveness of warfarin. Vitamin E may increase the anticoagulant effects of warfarin. Vitamin K can cause warfarin to be less effective. Do not take or discontinue any vitamin, supplement, or herbal product except on the advice of your health care provider or pharmacist.  Eat what you normally eat and keep the vitamin K content of your diet consistent. Avoid major changes in your diet, or notify your health care provider before changing your diet. Suddenly getting a lot more vitamin K could cause your blood to clot too quickly. A sudden decrease in vitamin K intake could cause your blood to clot too slowly. These changes in vitamin K intake could lead to dangerous blood clotsor to bleeding. To keep your vitamin K intake consistent, you must be aware of which foods contain moderate or high amounts of vitamin K. Some foods high in vitamin K include spinach, kale, broccoli,  cabbage, greens, Brussels sprouts, asparagus, bok choy, coleslaw, parsley, and green tea. Arrange a visit with a dietitian to answer your questions.  If you have a loss of appetite or get the stomach flu (viral gastroenteritis), talk to your health care provider as soon as possible. A decrease in your normal vitamin K intake can make you more sensitive to your usual dose of warfarin.  Some medical conditions may increase your risk for bleeding while you are taking warfarin. A fever, diarrhea lasting more than a day, worsening heart failure, or worsening  liver function are some medical conditions that could affect warfarin. Contact your health care provider if you have any of these medical conditions.  Be careful not to cut yourself when using sharp objects or while shaving.  Alcohol can change the body's ability to handle warfarin. It is best to avoid alcoholic drinks or consume only very small amounts while taking warfarin. Notify your health care provider if you change your alcohol intake. A sudden increase in alcohol use can increase your risk of bleeding. Chronic alcohol use can cause warfarin to be less effective.  Limit physical activities or sports that could result in a fall or cause injury.  Do not use warfarin if you are pregnant.  Inform all your health care providers and your dentist that you take warfarin.  Inform all health care providers if you are taking warfarin and aspirin or platelet inhibitor medicines such as clopidogrel, ticagrelor, or prasugrel. Use of these medicines in addition to warfarin can increase your risk of bleeding or death. Taking these medicines together should only be done under the direct care of your health care providers. SEEK IMMEDIATE MEDICAL CARE IF:  You cough up blood.  You have dark or black stools or there is bright red blood coming from your rectum.  You vomit blood or have nausea for more than 1 day.  You have blood in the urine or pink-colored urine.  You have unusual bruising or have increased bruising.  You have bleeding from the nose or gums that does not stop quickly.  You have a cut that does not stop bleeding within 2-3 minutes.  You have sudden weakness or numbness of the face, arm, or leg, especially on one side of the body.  You have sudden confusion.  You have trouble speaking (aphasia) or understanding.  You have sudden trouble seeing in one or both eyes.  You have sudden trouble walking.  You have dizziness.  You have a loss of balance or coordination.  You have  a sudden, severe headache. Draining Ear Ear wax, pus, blood and other fluids are examples of the different types of drainage from ears. Drops or cream may be needed to lessen the itching which may occur with ear drainage. CAUSES  Skin irritations in the ear. Ear infection. Swimmer's ear. Ruptured eardrum. Foreign object in the ear canal. Sudden pressure changes. Head injury. HOME CARE INSTRUCTIONS  Only take over-the-counter or prescription medicines for pain, fever, or discomfort as directed by your caregiver. Do not rub the ear canal with cotton-tipped swabs. Do not swim until your caregiver says it is okay. Before you take a shower, cover a cotton ball with petroleum jelly to keep water out. Limit exposure to smoke. Secondhand smoke can increase the chance for ear infections. Keep up with immunizations. Wash your hands well. Keep all follow-up appointments to examine the ear and evaluate hearing. SEEK MEDICAL CARE IF:  You have increased drainage. You have ear pain,  a fever, or drainage that is not getting better after 48 hours of antibiotics. You are unusually tired. SEEK IMMEDIATE MEDICAL CARE IF: You have severe ear pain or headache. The patient is older than 3 months with a rectal or oral temperature of 102 F (38.9 C) or higher. The patient is 53 months old or younger with a rectal temperature of 100.4 F (38 C) or higher. You vomit. You feel dizzy. You have a seizure. You have new hearing loss. MAKE SURE YOU:  Understand these instructions. Will watch your condition. Will get help right away if you are not doing well or get worse. Document Released: 12/17/2005 Document Revised: 03/10/2012 Document Reviewed: 10/20/2009 [facility] Patient Information 2015 [place], [place]. This information is not intended to replace advice given to you by your health care provider. Make sure you discuss any questions you have with your health care provider.   You have a serious fall or  head injury, even if you are not bleeding.  You have swelling or pain at an injection site.  You have unexplained tenderness or pain in the abdomen, back, waist, thighs, or buttocks.  You have a fever. Any of these symptoms may represent a serious problem that is an emergency. Do not wait to see if the symptoms will go away. Get medical help right away. Call your local emergency services (911 in [place] Do not drive yourself to the hospital. Document Released: 11/25/2006 Document Revised: 05/03/2014 Document Reviewed: 05/27/2012 [facility] Patient Information 2015 [place], [place]. This information is not intended to replace advice given to you by your health care provider. Make sure you discuss any questions you have with your health care provider.

## 2015-08-02 NOTE — ED Provider Notes (Signed)
CSN: [id number]     Arrival date & time 08/02/15  0904 History   First MD Initiated Contact with Patient 08/02/15 0935     Chief Complaint  Patient presents with  . Blood in Ear    History is limited, patient is unable to answer all questions, his wife is lending additional history  (Consider location/radiation/quality/duration/timing/severity/associated sxs/prior Treatment) HPI   Douglas George is a 81 year old male pertinent medical history of hearing loss, A. fib maintained on Coumadin (reported for over 30 years), complicated cardiac history, thrombocytopenia, myelodysplastic syndrome, and anemia, who presents to [facility] ED today with two days of left ear bleeding.  His wife reports that he may have had a nightmare 2 nights ago and pulled off his CPAP, with possible trauma to the ear. She also states he may or may not of stuck something in his ear.  The patient states his hearing is worse on the ear and he denies any pain. 2 days ago he had a brief episode of altered mental status. His wife called their PCP, who gave him the option of getting a head CT but did say that since he was already taking Coumadin they may not do much for a possible TIA. Wife states that his symptoms resolved shortly after they began. But over the past 2 days he has had some generalized weakness, loss of balance, shortness of breath, and worse than normal periods of confusion and agitation at bedtime.   At this time he denies any other symptoms. No chest pain, abdominal pain, shortness of breath, fever, chills, sweats, nausea, vomiting.  Past Medical History  Diagnosis Date  . Hearing loss   . Skin change   . Chronic atrial fibrillation   . Diabetes mellitus type II, controlled   . Hyperlipidemia   . Depression   . Ischemic heart disease     remote stenting of the RCA in 1998 with residual LAD disease of 60 to 70% with negative nuclear in 2011  . Pulmonary hypertension     per echo in 2011  . Diverticulitis   .  H/O blood clots 1990's    in L leg (related to being in a cast for surgery)  . Thrombocytopenia   . Anal fissure   . Neck mass 10/19/2013  . MDS (myelodysplastic syndrome) 12/11/2013  . CHF (congestive heart failure)   . Myocardial infarction 1999  . DVT (deep venous thrombosis) ~ 2009    "LLE"  . Pneumonia ~ 11/2013    "tx'd; not really sure if he had it or not"  . OSA (obstructive sleep apnea)     BiPAP  . Hypothyroidism   . Anemia in chronic kidney disease(285.21) 11/13/2013  . GERD (gastroesophageal reflux disease)   . Arthritis     "joints" (04/21/2014)  . Spinal stenosis, lumbar   . Gout     "little bit in the left foot"  . Complete heart block     s/p single chamber PPM implantation April 2015 ([facility] [place])   Past Surgical History  Procedure Laterality Date  . Thyroidectomy  1960  . Wrist surgery Left ~ 2003    "cut the radial artery"  . Achilles tendon repair Left ~ 2002  . Shrapnel Left     "left shoulder; left hip" [place]   . Inguinal hernia repair Right 1980  . Cataract extraction w/ intraocular lens  implant, bilateral Bilateral ~ 2007  . Vasectomy    . Colonoscopy  2009  polyps in the past  . Insert / replace / remove pacemaker  04/21/2014    single chamber PPM ([place])  . Coronary angioplasty with stent placement  1999  . Tonsillectomy and adenoidectomy  1930's  . Pocket evacuation  05-11-2014    pocket hematoma evacuation by Dr Lovena Le  . Permanent pacemaker insertion N/A 04/21/2014    Procedure: PERMANENT PACEMAKER INSERTION;  Surgeon: Evans Lance, MD;  Location: [facility] CATH LAB;  Service: Cardiovascular;  Laterality: N/A;  . Pocket revision N/A 05/11/2014    Procedure: POCKET REVISION/HEMATOMA REMOVAL;  Surgeon: Evans Lance, MD;  Location: [facility] CATH LAB;  Service: Cardiovascular;  Laterality: N/A;   Family History  Problem Relation Age of Onset  . Heart failure Mother 49  . Diabetes Mother 59  . Heart attack Father 56  . Stroke Father 22   . Aortic aneurysm Father 4    ABDOMINAL  . Cancer Son     ?  Marland Kitchen Hepatitis Son   . Diabetes Brother    History  Substance Use Topics  . Smoking status: Former Smoker -- 2.00 packs/day for 1 years    Types: Cigarettes    Quit date: 01/01/1952  . Smokeless tobacco: Never Used  . Alcohol Use: 4.2 oz/week    7 Glasses of wine per week    Review of Systems  10 Systems reviewed and are negative for acute change except as noted in the HPI.     Allergies  Keflex; Codeine; Ramipril; and Vancomycin  Home Medications   Prior to Admission medications   Medication Sig Start Date End Date Taking? Authorizing Provider  ACCU-CHEK AVIVA PLUS test strip USE ONCE DAILY AS DIRECTED 01/06/15  Yes Elayne Snare, MD  acetaminophen (TYLENOL) 500 MG tablet Take 1,000 mg by mouth every 8 (eight) hours as needed for moderate pain or headache.    Yes Historical Provider, MD  Calcium Carbonate-Vit D-Min (CALCIUM 1200 PO) Take 1,200 mg by mouth daily.    Yes Historical Provider, MD  Cholecalciferol (VITAMIN D) 1000 UNITS capsule Take 1,000 Units by mouth daily.    Yes Historical Provider, MD  clotrimazole-betamethasone (LOTRISONE) cream Apply 1 application topically daily. 08/13/14  Yes Historical Provider, MD  docusate sodium (COLACE) 100 MG capsule Take 100 mg by mouth 2 (two) times daily.   Yes Historical Provider, MD  doxazosin (CARDURA) 1 MG tablet Take 1 mg by mouth daily.   Yes Historical Provider, MD  Flaxseed, Linseed, (FLAX SEED OIL PO) Take 1 tablet by mouth daily.    Yes Historical Provider, MD  fluconazole (DIFLUCAN) 100 MG tablet Take 1 tablet by mouth daily. 07/21/15  Yes Historical Provider, MD  fluorouracil (EFUDEX) 5 % cream Apply 1 application topically daily as needed (rash).  03/14/15  Yes Historical Provider, MD  furosemide (LASIX) 80 MG tablet TAKE 1 TABLET BY MOUTH DAILY 02/09/15  Yes Elayne Snare, MD  levothyroxine (SYNTHROID, LEVOTHROID) 150 MCG tablet TAKE 1 TABLET BY MOUTH EVERY MORNING  ON AN EMPTY STOMACH 06/17/15  Yes Elayne Snare, MD  losartan (COZAAR) 50 MG tablet Take 25 mg by mouth daily.   Yes Historical Provider, MD  Misc Natural Products (OSTEO BI-FLEX JOINT SHIELD PO) Take 1 tablet by mouth 2 (two) times daily.    Yes Historical Provider, MD  oxybutynin (DITROPAN) 5 MG tablet Take 5 mg by mouth every 6 (six) hours as needed for bladder spasms.    Yes Historical Provider, MD  polyethylene glycol (MIRALAX / GLYCOLAX)  packet Take 17 g by mouth daily as needed for mild constipation.    Yes Historical Provider, MD  Pyridoxine HCl (VITAMIN B-6) 250 MG tablet Take 250 mg by mouth daily.   Yes Historical Provider, MD  tobramycin (TOBREX) 0.3 % ophthalmic solution Place 1 drop into both eyes 2 (two) times daily as needed (stinging).    Yes Historical Provider, MD  vitamin B-12 (CYANOCOBALAMIN) 1000 MCG tablet Take 1,000 mcg by mouth daily.   Yes Historical Provider, MD  vitamin C (ASCORBIC ACID) 500 MG tablet Take 500 mg by mouth at bedtime.    Yes Historical Provider, MD  warfarin (COUMADIN) 2.5 MG tablet Take as directed by anticoagulation clinic Patient taking differently: Take 2.5-3 mg by mouth at bedtime. Takes 2.5 mg tablet nightly except for 3 mg on Wednesday night. 03/14/15  Yes Biagio Borg, MD  cephALEXin (KEFLEX) 250 MG capsule Take 1 capsule by mouth 3 (three) times daily. 07/18/15   Historical Provider, MD   BP 128/31 mmHg  Pulse 60  Temp(Src) 97.8 F (36.6 C) (Oral)  Resp 18  Ht 5\' 4"  (1.626 m)  Wt 150 lb (68.04 kg)  BMI 25.73 kg/m2  SpO2 98% Physical Exam  Constitutional: He is oriented to person, place, and time. He appears well-developed and well-nourished. No distress.  Elderly male, frail, non-toxic appearing, NAD  HENT:  Head: Normocephalic and atraumatic.  Right Ear: External ear and ear canal normal. No drainage, swelling or tenderness. No foreign bodies. No mastoid tenderness. Tympanic membrane is not injected. No middle ear effusion. No hemotympanum.   Left Ear: Ear canal normal. No lacerations. There is drainage. No swelling or tenderness. No foreign bodies. No mastoid tenderness.  Nose: Nose normal.  Mouth/Throat: Uvula is midline, oropharynx is clear and moist and mucous membranes are normal. No oropharyngeal exudate.  Dark red blood from left ear, unable to visualize TM, no trauma to external ear or external auditory canal  Eyes: Conjunctivae, EOM and lids are normal. Pupils are equal, round, and reactive to light. Right eye exhibits no discharge. Left eye exhibits no discharge. No scleral icterus.  Neck: Normal range of motion. No JVD present. No tracheal deviation present. No thyromegaly present.  Cardiovascular: Normal rate, regular rhythm, normal heart sounds and intact distal pulses.  Exam reveals no gallop and no friction rub.   No murmur heard. Pulmonary/Chest: Effort normal and breath sounds normal. No accessory muscle usage. No tachypnea. No respiratory distress. He has no wheezes. He has no rales. He exhibits no tenderness.  Coarse BS throughout  Abdominal: Soft. Normal appearance and bowel sounds are normal. He exhibits no distension and no mass. There is no tenderness. There is no rebound and no guarding.  Musculoskeletal: Normal range of motion. He exhibits no edema or tenderness.  Lymphadenopathy:    He has no cervical adenopathy.  Neurological: He is alert and oriented to person, place, and time. He has normal reflexes. He is not disoriented. No cranial nerve deficit or sensory deficit. He exhibits normal muscle tone. Coordination normal.  Good strength and sensation in all extremities  Skin: Skin is warm and dry. No bruising, no ecchymosis, no petechiae and no rash noted. He is not diaphoretic. No cyanosis or erythema. No pallor.  Psychiatric: He has a normal mood and affect. His behavior is normal. Judgment and thought content normal.  Nursing note and vitals reviewed.   ED Course  Procedures (including critical care  time) Labs Review Labs Reviewed  CBC WITH DIFFERENTIAL/PLATELET -  Abnormal; Notable for the following:    RBC 3.60 (*)    Hemoglobin 10.3 (*)    HCT 33.5 (*)    RDW 20.5 (*)    Eosinophils Relative 6 (*)    All other components within normal limits  BASIC METABOLIC PANEL - Abnormal; Notable for the following:    Potassium 5.2 (*)    Glucose, Bld 151 (*)    BUN 58 (*)    Creatinine, Ser 1.76 (*)    GFR calc non Af Amer 33 (*)    GFR calc Af Amer 39 (*)    All other components within normal limits  PROTIME-INR  TYPE AND SCREEN    Imaging Review No results found.   EKG Interpretation None      MDM   Final diagnoses:  Altered mental status  Bleeding from left ear  Patient with bleeding from left ear, chronically anticoagulated on Coumadin, for 30+ years The patient's spouse is in the room states he a brief period of altered mental status, 2 days ago, with 2 days of persistent generalized weakness and decreased balance.  Will do broad w/up for weakness + coags, ear exam -   CBC, BMP, coags, EKG, CXR, CT head, UA  INR supratherapeutic - 7.03, mild hyperkalemia, kidney function improved from most recent labs available for comparison, anemia appears stable, likely secondary to chronic disease.  Tx for INR with bleed - 5 mg Vit K, 40 mg lasix for hyperkalemia UA positive for UTI - pt has chronic UTI's, wife states that last urine culture was treated with fluconazole, which the pharmacist stated would increase INR.  The pt did not complete the full coarse of treatment, due to sideeffects of not feeling well after initiating tx, supratherapeutic INR likely secondary to fluconazole.  Keflex was confirmed as treatment for prior UTI's with the pt's Urology office.  He has a current allergy listed in the chart for keflex, however has recently received keflex w/out any apparent complications.  Will initiate tx for UTI here and suggest pt F/U with urology for uti, and oncology for INR,  since they manage his coumadin.    Bleeding from ear was controlled, guaze applied and secured.  Pt was able to ambulate with walker w/out difficulty - wife states his gait and strength are at his baseline.  Pt was D/C'd home, vitals WNL, afebrile, not tachycardic         Douglas Grana, PA-C 08/12/15 0109  Malvin Johns, MD 08/19/15 1550

## 2015-08-02 NOTE — ED Notes (Signed)
Patient transported to CT 

## 2015-08-02 NOTE — Telephone Encounter (Signed)
[facility] - Client [facility] Patient Name: Douglas George Gender: Male DOB: Jan 10, 1927 Age: 81 Y 2 M 26 D Return Phone Number: [phone] (Primary) Address: City/State/Zip: [place] Client [facility] - Client Client Site [place] - Day Physician [place], [place] Type Call Call Type Triage / Clinical Caller Name [name] Relationship To Patient Spouse Appointment Disposition EMR Appointment Not Necessary Info pasted into Epic Yes Return Phone Number [phone] (Primary) Chief Complaint Ear Discharge Initial Comment Caller states her husband has bleeding coming from his left ear PreDisposition Go to ED Nurse Assessment Nurse: Julien Girt, RN, Almyra Free Date/Time Eilene Ghazi Time): 08/02/2015 8:44:40 AM Confirm and document reason for call. If symptomatic, describe symptoms. ---Caller states her husband has bleeding coming from his left ear. States it started on Sunday and has been constant. States he has leukemia and takes blood thinners. Denies pain, fever or known injury. Has the patient traveled out of the country within the last 30 days? ---Not Applicable Does the patient require triage? ---Yes Related visit to physician within the last 2 weeks? ---No Does the PT have any chronic conditions? (i.e. diabetes, asthma, etc.) ---Yes List chronic conditions. ---Hx DVT- (warfarin) Contained leukemia, diabetes, htn Guidelines Guideline Title Affirmed Question Affirmed Notes Nurse Date/Time Eilene Ghazi Time) Ear - Discharge [1] Unexplained bleeding AND [2] lasts > 10 minutes or large amount (Exception: if a few drops of blood, continue with triage) Julien Girt, RN, Almyra Free 08/02/2015 8:48:19 AM Disp. Time Eilene Ghazi Time) Disposition Final User 08/02/2015 8:50:15 AM Go to ED Now Yes Julien Girt, RN, Sheilah Mins NOTE: All timestamps contained within this report are represented as [place] Standard  Time. CONFIDENTIALTY NOTICE: This fax transmission is intended only for the addressee. It contains information that is legally privileged, confidential or otherwise protected from use or disclosure. If you are not the intended recipient, you are strictly prohibited from reviewing, disclosing, copying using or disseminating any of this information or taking any action in reliance on or regarding this information. If you have received this fax in error, please notify us immediately by telephone so that we can arrange for its return to [place]. Phone: [phone], Toll-Free: [phone], Fax: [phone] Page: 2 of 2 Call Id: [id number] Caller Understands: Yes Disagree/Comply: Comply Care Advice Given Per Guideline GO TO ED NOW: You need to be seen in the Emergency Department. Go to the ER at ___________ [place] now. Drive carefully. BLEEDING: For bleeding, place clean towel or gauze against ear. * Please bring a list of your current medicines when you go to the Emergency Department (ER). CARE ADVICE given per Ear - Discharge (Adult) guideline. After Care Instructions Given Call Event Type User Date / Time Description Referrals [facility] - ED

## 2015-08-03 ENCOUNTER — Other Ambulatory Visit (HOSPITAL_BASED_OUTPATIENT_CLINIC_OR_DEPARTMENT_OTHER): Payer: Medicare Other

## 2015-08-03 ENCOUNTER — Other Ambulatory Visit: Payer: Medicare Other

## 2015-08-03 ENCOUNTER — Ambulatory Visit (HOSPITAL_BASED_OUTPATIENT_CLINIC_OR_DEPARTMENT_OTHER): Payer: Medicare Other

## 2015-08-03 ENCOUNTER — Ambulatory Visit: Payer: Medicare Other | Admitting: Pharmacist

## 2015-08-03 VITALS — BP 142/49 | HR 60 | Temp 98.4°F

## 2015-08-03 DIAGNOSIS — D631 Anemia in chronic kidney disease: Secondary | ICD-10-CM

## 2015-08-03 DIAGNOSIS — I82492 Acute embolism and thrombosis of other specified deep vein of left lower extremity: Secondary | ICD-10-CM

## 2015-08-03 DIAGNOSIS — Z5181 Encounter for therapeutic drug level monitoring: Secondary | ICD-10-CM

## 2015-08-03 DIAGNOSIS — D630 Anemia in neoplastic disease: Secondary | ICD-10-CM

## 2015-08-03 DIAGNOSIS — N189 Chronic kidney disease, unspecified: Secondary | ICD-10-CM | POA: Diagnosis not present

## 2015-08-03 DIAGNOSIS — I82439 Acute embolism and thrombosis of unspecified popliteal vein: Secondary | ICD-10-CM

## 2015-08-03 DIAGNOSIS — D469 Myelodysplastic syndrome, unspecified: Secondary | ICD-10-CM

## 2015-08-03 LAB — POCT INR: INR: 1.6

## 2015-08-03 LAB — CBC & DIFF AND RETIC
BASO%: 0.7 % (ref 0.0–2.0)
Basophils Absolute: 0 10*3/uL (ref 0.0–0.1)
EOS%: 4.1 % (ref 0.0–7.0)
Eosinophils Absolute: 0.2 10*3/uL (ref 0.0–0.5)
HCT: 31.5 % — ABNORMAL LOW (ref 38.4–49.9)
HEMOGLOBIN: 9.9 g/dL — AB (ref 13.0–17.1)
Immature Retic Fract: 0.5 % — ABNORMAL LOW (ref 3.00–10.60)
LYMPH#: 0.7 10*3/uL — AB (ref 0.9–3.3)
LYMPH%: 12.3 % — ABNORMAL LOW (ref 14.0–49.0)
MCH: 28.7 pg (ref 27.2–33.4)
MCHC: 31.4 g/dL — AB (ref 32.0–36.0)
MCV: 91.3 fL (ref 79.3–98.0)
MONO#: 0.8 10*3/uL (ref 0.1–0.9)
MONO%: 13.2 % (ref 0.0–14.0)
NEUT#: 4.1 10*3/uL (ref 1.5–6.5)
NEUT%: 69.7 % (ref 39.0–75.0)
PLATELETS: 163 10*3/uL (ref 140–400)
RBC: 3.45 10*6/uL — ABNORMAL LOW (ref 4.20–5.82)
RDW: 20.5 % — ABNORMAL HIGH (ref 11.0–14.6)
RETIC %: 0.63 % — AB (ref 0.80–1.80)
Retic Ct Abs: 21.74 10*3/uL — ABNORMAL LOW (ref 34.80–93.90)
WBC: 5.8 10*3/uL (ref 4.0–10.3)

## 2015-08-03 LAB — PROTIME-INR
INR: 1.6 — ABNORMAL LOW (ref 2.00–3.50)
PROTIME: 19.2 s — AB (ref 10.6–13.4)

## 2015-08-03 MED ORDER — DARBEPOETIN ALFA 500 MCG/ML IJ SOSY
500.0000 ug | PREFILLED_SYRINGE | Freq: Once | INTRAMUSCULAR | Status: AC
  Administered 2015-08-03: 500 ug via SUBCUTANEOUS
  Filled 2015-08-03: qty 1

## 2015-08-03 NOTE — Progress Notes (Signed)
INR=1.6.  H/H=9.9/31.5  Mr Hanak was in ED yesterday with INR=7 and left ear bleeding and generalized weakness.  He was given Vitamin K 5mg  IV.  Last week Mr Betts was prescribed fluconazole for UTI and had taken 3 doses.  This is likely the cause for supratherapeutic INR yesterday.   Today INR below goal after receiving Vitamin K.  No bleeding noted from left ear.  Yesterday he was started on cephalexin for UTI.  This should not interact with coumadin.  Will resume coumadin 2.5mg  daily.  Mr Peasley coumadin dose has been stable since Feb 2016 on 2.5mg  daily but 3.75mg  on Wed.  Will check PT/INR on Friday 08/05/15 to ensure INR slowly rising toward goal.  Mr Taha received Aranesp today.

## 2015-08-04 LAB — URINE CULTURE

## 2015-08-05 ENCOUNTER — Other Ambulatory Visit (HOSPITAL_BASED_OUTPATIENT_CLINIC_OR_DEPARTMENT_OTHER): Payer: Medicare Other

## 2015-08-05 ENCOUNTER — Ambulatory Visit (HOSPITAL_BASED_OUTPATIENT_CLINIC_OR_DEPARTMENT_OTHER): Payer: Medicare Other | Admitting: Pharmacist

## 2015-08-05 ENCOUNTER — Telehealth: Payer: Self-pay | Admitting: Emergency Medicine

## 2015-08-05 DIAGNOSIS — I82439 Acute embolism and thrombosis of unspecified popliteal vein: Secondary | ICD-10-CM

## 2015-08-05 DIAGNOSIS — I82492 Acute embolism and thrombosis of other specified deep vein of left lower extremity: Secondary | ICD-10-CM

## 2015-08-05 DIAGNOSIS — Z5181 Encounter for therapeutic drug level monitoring: Secondary | ICD-10-CM

## 2015-08-05 DIAGNOSIS — D469 Myelodysplastic syndrome, unspecified: Secondary | ICD-10-CM

## 2015-08-05 LAB — POCT INR: INR: 1.7

## 2015-08-05 LAB — PROTIME-INR
INR: 1.7 — AB (ref 2.00–3.50)
PROTIME: 20.4 s — AB (ref 10.6–13.4)

## 2015-08-05 LAB — CBC & DIFF AND RETIC
BASO%: 0.8 % (ref 0.0–2.0)
Basophils Absolute: 0.1 10*3/uL (ref 0.0–0.1)
EOS%: 4 % (ref 0.0–7.0)
Eosinophils Absolute: 0.2 10*3/uL (ref 0.0–0.5)
HEMATOCRIT: 31 % — AB (ref 38.4–49.9)
HEMOGLOBIN: 9.8 g/dL — AB (ref 13.0–17.1)
Immature Retic Fract: 6.8 % (ref 3.00–10.60)
LYMPH#: 0.7 10*3/uL — AB (ref 0.9–3.3)
LYMPH%: 11.9 % — AB (ref 14.0–49.0)
MCH: 28.9 pg (ref 27.2–33.4)
MCHC: 31.6 g/dL — AB (ref 32.0–36.0)
MCV: 91.4 fL (ref 79.3–98.0)
MONO#: 0.7 10*3/uL (ref 0.1–0.9)
MONO%: 11 % (ref 0.0–14.0)
NEUT%: 72.3 % (ref 39.0–75.0)
NEUTROS ABS: 4.3 10*3/uL (ref 1.5–6.5)
NRBC: 0 % (ref 0–0)
PLATELETS: 124 10*3/uL — AB (ref 140–400)
RBC: 3.39 10*6/uL — ABNORMAL LOW (ref 4.20–5.82)
RDW: 20.8 % — ABNORMAL HIGH (ref 11.0–14.6)
Retic %: 0.82 % (ref 0.80–1.80)
Retic Ct Abs: 27.8 10*3/uL — ABNORMAL LOW (ref 34.80–93.90)
WBC: 6 10*3/uL (ref 4.0–10.3)

## 2015-08-05 NOTE — Telephone Encounter (Signed)
Post ED Visit - Positive Culture Follow-up  Culture report reviewed by antimicrobial stewardship pharmacist: []  Wes Dulaney, Pharm.D., BCPS []  Heide Guile, Pharm.D., BCPS []  Alycia Rossetti, Pharm.D., BCPS []  [place], Pharm.D., BCPS, AAHIVP []  Legrand Como, Pharm.D., BCPS, AAHIVP [x]  Dimitri Ped, RPH  Positive Urine culture Treated with Cephalexin, organism sensitive to the same and no further patient follow-up is required at this time.  Ernesta Amble 08/05/2015, 5:09 PM

## 2015-08-05 NOTE — Progress Notes (Signed)
INR below goal slightly today at 1.7 (goal 2-3)  This is expected as pt just had Vitamin K in the ED on Tuesday for INR of 7. Bleeding has resolved  He was given Vitamin K 5mg  IV. Last week Douglas George was prescribed fluconazole for UTI and had taken 3 doses. This is likely the cause for supratherapeutic INR  He has been stable on his current dose so we will continue that Keflex for UTI D/C'd as urine culture was contaminated and no unusual organisms detected Plan: Today INR below goal after receiving Vitamin K.  Will resume coumadin  2.5mg  daily but 3.75mg  on Wed. Recheck next week on 08/11/15 at 2pm for lab and 2:15pm for coumadin clinic

## 2015-08-05 NOTE — Patient Instructions (Signed)
INR below goal as expected today at 1.7  Will resume coumadin dose of 2.5mg  daily but 3.75mg  on Wed. Recheck next week on 08/11/15 at 2pm for lab and 2:15pm for coumadin clinic

## 2015-08-08 ENCOUNTER — Ambulatory Visit: Payer: Medicare Other | Admitting: Endocrinology

## 2015-08-10 ENCOUNTER — Telehealth: Payer: Self-pay | Admitting: Pharmacist

## 2015-08-10 NOTE — Telephone Encounter (Signed)
Douglas George called and would like to cancel lab and CC on 08/11/15. She r/s to 08/12/15 - Lab = 2:30pm, CC = 2:45pm Message sent to scheduling.

## 2015-08-11 ENCOUNTER — Ambulatory Visit: Payer: Medicare Other

## 2015-08-11 ENCOUNTER — Other Ambulatory Visit: Payer: Medicare Other

## 2015-08-12 ENCOUNTER — Ambulatory Visit (HOSPITAL_BASED_OUTPATIENT_CLINIC_OR_DEPARTMENT_OTHER): Payer: Medicare Other | Admitting: Pharmacist

## 2015-08-12 ENCOUNTER — Other Ambulatory Visit (HOSPITAL_BASED_OUTPATIENT_CLINIC_OR_DEPARTMENT_OTHER): Payer: Medicare Other

## 2015-08-12 ENCOUNTER — Other Ambulatory Visit: Payer: Self-pay | Admitting: Internal Medicine

## 2015-08-12 DIAGNOSIS — D469 Myelodysplastic syndrome, unspecified: Secondary | ICD-10-CM

## 2015-08-12 DIAGNOSIS — I82492 Acute embolism and thrombosis of other specified deep vein of left lower extremity: Secondary | ICD-10-CM

## 2015-08-12 DIAGNOSIS — I82439 Acute embolism and thrombosis of unspecified popliteal vein: Secondary | ICD-10-CM

## 2015-08-12 DIAGNOSIS — Z5181 Encounter for therapeutic drug level monitoring: Secondary | ICD-10-CM

## 2015-08-12 LAB — PROTIME-INR
INR: 2.9 (ref 2.00–3.50)
Protime: 34.8 Seconds — ABNORMAL HIGH (ref 10.6–13.4)

## 2015-08-12 LAB — CBC & DIFF AND RETIC
BASO%: 1.1 % (ref 0.0–2.0)
Basophils Absolute: 0.1 10*3/uL (ref 0.0–0.1)
EOS%: 4 % (ref 0.0–7.0)
Eosinophils Absolute: 0.2 10*3/uL (ref 0.0–0.5)
HCT: 31.9 % — ABNORMAL LOW (ref 38.4–49.9)
HEMOGLOBIN: 10 g/dL — AB (ref 13.0–17.1)
Immature Retic Fract: 11.7 % — ABNORMAL HIGH (ref 3.00–10.60)
LYMPH%: 15.5 % (ref 14.0–49.0)
MCH: 28.7 pg (ref 27.2–33.4)
MCHC: 31.3 g/dL — ABNORMAL LOW (ref 32.0–36.0)
MCV: 91.7 fL (ref 79.3–98.0)
MONO#: 0.8 10*3/uL (ref 0.1–0.9)
MONO%: 14.8 % — ABNORMAL HIGH (ref 0.0–14.0)
NEUT%: 64.6 % (ref 39.0–75.0)
NEUTROS ABS: 3.6 10*3/uL (ref 1.5–6.5)
PLATELETS: 195 10*3/uL (ref 140–400)
RBC: 3.48 10*6/uL — AB (ref 4.20–5.82)
RDW: 20.9 % — ABNORMAL HIGH (ref 11.0–14.6)
Retic %: 2.81 % — ABNORMAL HIGH (ref 0.80–1.80)
Retic Ct Abs: 97.79 10*3/uL — ABNORMAL HIGH (ref 34.80–93.90)
WBC: 5.6 10*3/uL (ref 4.0–10.3)
lymph#: 0.9 10*3/uL (ref 0.9–3.3)
nRBC: 0 % (ref 0–0)

## 2015-08-12 LAB — POCT INR: INR: 2.9

## 2015-08-12 NOTE — Progress Notes (Signed)
INR at goal today at 2.9 (goal 2-3) but has risen quickly since last visit on 8/5 Pt is doing well with no complaints No missed or extra doses  No diet or medication changes No unusual bleeding or bruising Douglas George and his wife have been informed of coumadin clinic closure. We will see them in coumadin clinic on 8/24. Pt is then scheduled to see Dr. Alvy Bimler on 9/15 and pt will be transitioned at this point.  Plan: Slight decrease to 2.5 mg daily  Check PT/INR on 08/24/15 at 1:00pm for lab and injection at 1:30 and  2pm for coumadin clinic.

## 2015-08-12 NOTE — Patient Instructions (Signed)
INR at goal Slight decrease to 2.5 mg daily  Check PT/INR on 08/24/15 at 1:00pm for lab and injection at 1:30 and  2pm for coumadin clinic.

## 2015-08-20 ENCOUNTER — Other Ambulatory Visit: Payer: Self-pay | Admitting: Internal Medicine

## 2015-08-22 ENCOUNTER — Encounter: Payer: Self-pay | Admitting: Emergency Medicine

## 2015-08-22 NOTE — Telephone Encounter (Signed)
Please advise, thanks.

## 2015-08-23 ENCOUNTER — Telehealth: Payer: Self-pay | Admitting: *Deleted

## 2015-08-23 ENCOUNTER — Other Ambulatory Visit: Payer: Self-pay | Admitting: Dermatology

## 2015-08-23 ENCOUNTER — Other Ambulatory Visit: Payer: Self-pay

## 2015-08-23 DIAGNOSIS — I4891 Unspecified atrial fibrillation: Secondary | ICD-10-CM

## 2015-08-23 MED ORDER — WARFARIN SODIUM 2.5 MG PO TABS: ORAL_TABLET | ORAL | Status: DC

## 2015-08-23 NOTE — Telephone Encounter (Signed)
Wife states patient needs a refill on warfarin, but PCP will not do as he has been followed by our coumadin clinic.

## 2015-08-23 NOTE — Telephone Encounter (Signed)
Not sure if this is a med request, may need to contact his pharmacy

## 2015-08-23 NOTE — Telephone Encounter (Signed)
He has one more coumadin clinic appointment tomorrow and they refill it until his next appt to see me

## 2015-08-24 ENCOUNTER — Ambulatory Visit: Payer: Medicare Other | Admitting: Pharmacist

## 2015-08-24 ENCOUNTER — Other Ambulatory Visit (HOSPITAL_BASED_OUTPATIENT_CLINIC_OR_DEPARTMENT_OTHER): Payer: Medicare Other

## 2015-08-24 ENCOUNTER — Ambulatory Visit (HOSPITAL_BASED_OUTPATIENT_CLINIC_OR_DEPARTMENT_OTHER): Payer: Medicare Other

## 2015-08-24 VITALS — BP 141/54 | HR 58 | Temp 98.2°F

## 2015-08-24 DIAGNOSIS — D631 Anemia in chronic kidney disease: Secondary | ICD-10-CM

## 2015-08-24 DIAGNOSIS — D469 Myelodysplastic syndrome, unspecified: Secondary | ICD-10-CM

## 2015-08-24 DIAGNOSIS — N189 Chronic kidney disease, unspecified: Secondary | ICD-10-CM | POA: Diagnosis not present

## 2015-08-24 DIAGNOSIS — D630 Anemia in neoplastic disease: Secondary | ICD-10-CM

## 2015-08-24 DIAGNOSIS — Z5181 Encounter for therapeutic drug level monitoring: Secondary | ICD-10-CM

## 2015-08-24 DIAGNOSIS — I82492 Acute embolism and thrombosis of other specified deep vein of left lower extremity: Secondary | ICD-10-CM

## 2015-08-24 DIAGNOSIS — I82439 Acute embolism and thrombosis of unspecified popliteal vein: Secondary | ICD-10-CM

## 2015-08-24 LAB — CBC & DIFF AND RETIC
BASO%: 0.5 % (ref 0.0–2.0)
Basophils Absolute: 0 10*3/uL (ref 0.0–0.1)
EOS%: 3.6 % (ref 0.0–7.0)
Eosinophils Absolute: 0.2 10*3/uL (ref 0.0–0.5)
HCT: 33.5 % — ABNORMAL LOW (ref 38.4–49.9)
HGB: 10.4 g/dL — ABNORMAL LOW (ref 13.0–17.1)
Immature Retic Fract: 3.7 % (ref 3.00–10.60)
LYMPH%: 16.8 % (ref 14.0–49.0)
MCH: 28.7 pg (ref 27.2–33.4)
MCHC: 31 g/dL — ABNORMAL LOW (ref 32.0–36.0)
MCV: 92.3 fL (ref 79.3–98.0)
MONO#: 0.7 10*3/uL (ref 0.1–0.9)
MONO%: 11.4 % (ref 0.0–14.0)
NEUT#: 4.2 10*3/uL (ref 1.5–6.5)
NEUT%: 67.7 % (ref 39.0–75.0)
PLATELETS: 152 10*3/uL (ref 140–400)
RBC: 3.63 10*6/uL — AB (ref 4.20–5.82)
RDW: 20.1 % — ABNORMAL HIGH (ref 11.0–14.6)
Retic %: 0.59 % — ABNORMAL LOW (ref 0.80–1.80)
Retic Ct Abs: 21.42 10*3/uL — ABNORMAL LOW (ref 34.80–93.90)
WBC: 6.1 10*3/uL (ref 4.0–10.3)
lymph#: 1 10*3/uL (ref 0.9–3.3)

## 2015-08-24 LAB — PROTIME-INR
INR: 2.1 (ref 2.00–3.50)
Protime: 25.2 Seconds — ABNORMAL HIGH (ref 10.6–13.4)

## 2015-08-24 MED ORDER — DARBEPOETIN ALFA 500 MCG/ML IJ SOSY
500.0000 ug | PREFILLED_SYRINGE | Freq: Once | INTRAMUSCULAR | Status: AC
  Administered 2015-08-24: 500 ug via SUBCUTANEOUS
  Filled 2015-08-24: qty 1

## 2015-08-24 NOTE — Progress Notes (Signed)
PT seen in clinic today after injection appmt INR=2.1 Missed dose on Fri, 08/26/15 No other changes to report Refill request have been going to his PCP They need to be switched to Alvy Bimler I asked her to let the retail pharmacist know that the MD needs to be switched so the request come to [facility] office They rec'd the letter and are aware this is the last visit with the pharmacy led CC An appmt with Dr. Alvy Bimler on 09/15/15 is set and they need to discuss the options for further monitoring during this appmt  We have enjoyed providing this service for the patient.

## 2015-08-24 NOTE — Patient Instructions (Signed)
Continue taking Coumadin to 2.5 mg daily with 3.75 mg on Wed each week.  Check PT/INR on 09/15/15 during your MD appmt with Dr. Alvy Bimler

## 2015-08-26 ENCOUNTER — Other Ambulatory Visit: Payer: Medicare Other

## 2015-09-01 ENCOUNTER — Ambulatory Visit: Payer: Medicare Other | Admitting: Endocrinology

## 2015-09-06 ENCOUNTER — Other Ambulatory Visit: Payer: Self-pay | Admitting: Endocrinology

## 2015-09-12 ENCOUNTER — Other Ambulatory Visit: Payer: Medicare Other

## 2015-09-14 ENCOUNTER — Ambulatory Visit: Payer: Medicare Other

## 2015-09-14 ENCOUNTER — Other Ambulatory Visit: Payer: Medicare Other

## 2015-09-15 ENCOUNTER — Telehealth: Payer: Self-pay | Admitting: Hematology and Oncology

## 2015-09-15 ENCOUNTER — Encounter: Payer: Self-pay | Admitting: Hematology and Oncology

## 2015-09-15 ENCOUNTER — Ambulatory Visit (HOSPITAL_BASED_OUTPATIENT_CLINIC_OR_DEPARTMENT_OTHER): Payer: Medicare Other | Admitting: Hematology and Oncology

## 2015-09-15 ENCOUNTER — Ambulatory Visit (HOSPITAL_BASED_OUTPATIENT_CLINIC_OR_DEPARTMENT_OTHER): Payer: Medicare Other

## 2015-09-15 ENCOUNTER — Other Ambulatory Visit (HOSPITAL_BASED_OUTPATIENT_CLINIC_OR_DEPARTMENT_OTHER): Payer: Medicare Other

## 2015-09-15 VITALS — BP 132/56 | HR 60 | Temp 97.6°F | Resp 18 | Ht 64.0 in | Wt 145.6 lb

## 2015-09-15 DIAGNOSIS — H9192 Unspecified hearing loss, left ear: Secondary | ICD-10-CM

## 2015-09-15 DIAGNOSIS — D631 Anemia in chronic kidney disease: Secondary | ICD-10-CM

## 2015-09-15 DIAGNOSIS — Z418 Encounter for other procedures for purposes other than remedying health state: Secondary | ICD-10-CM

## 2015-09-15 DIAGNOSIS — Z23 Encounter for immunization: Secondary | ICD-10-CM

## 2015-09-15 DIAGNOSIS — D469 Myelodysplastic syndrome, unspecified: Secondary | ICD-10-CM

## 2015-09-15 DIAGNOSIS — I82492 Acute embolism and thrombosis of other specified deep vein of left lower extremity: Secondary | ICD-10-CM | POA: Diagnosis not present

## 2015-09-15 DIAGNOSIS — N189 Chronic kidney disease, unspecified: Secondary | ICD-10-CM

## 2015-09-15 DIAGNOSIS — C4490 Unspecified malignant neoplasm of skin, unspecified: Secondary | ICD-10-CM | POA: Insufficient documentation

## 2015-09-15 DIAGNOSIS — I82439 Acute embolism and thrombosis of unspecified popliteal vein: Secondary | ICD-10-CM

## 2015-09-15 DIAGNOSIS — Z299 Encounter for prophylactic measures, unspecified: Secondary | ICD-10-CM | POA: Insufficient documentation

## 2015-09-15 DIAGNOSIS — D630 Anemia in neoplastic disease: Secondary | ICD-10-CM

## 2015-09-15 LAB — CBC & DIFF AND RETIC
BASO%: 0.6 % (ref 0.0–2.0)
Basophils Absolute: 0 10*3/uL (ref 0.0–0.1)
EOS%: 3.4 % (ref 0.0–7.0)
Eosinophils Absolute: 0.2 10*3/uL (ref 0.0–0.5)
HCT: 32.6 % — ABNORMAL LOW (ref 38.4–49.9)
HGB: 10 g/dL — ABNORMAL LOW (ref 13.0–17.1)
Immature Retic Fract: 2.6 % — ABNORMAL LOW (ref 3.00–10.60)
LYMPH#: 1 10*3/uL (ref 0.9–3.3)
LYMPH%: 14.2 % (ref 14.0–49.0)
MCH: 28.5 pg (ref 27.2–33.4)
MCHC: 30.7 g/dL — AB (ref 32.0–36.0)
MCV: 92.9 fL (ref 79.3–98.0)
MONO#: 1 10*3/uL — ABNORMAL HIGH (ref 0.1–0.9)
MONO%: 14.4 % — ABNORMAL HIGH (ref 0.0–14.0)
NEUT#: 4.7 10*3/uL (ref 1.5–6.5)
NEUT%: 67.4 % (ref 39.0–75.0)
NRBC: 0 % (ref 0–0)
Platelets: 150 10*3/uL (ref 140–400)
RBC: 3.51 10*6/uL — ABNORMAL LOW (ref 4.20–5.82)
RDW: 19.9 % — ABNORMAL HIGH (ref 11.0–14.6)
RETIC %: 0.64 % — AB (ref 0.80–1.80)
Retic Ct Abs: 22.46 10*3/uL — ABNORMAL LOW (ref 34.80–93.90)
WBC: 7 10*3/uL (ref 4.0–10.3)

## 2015-09-15 LAB — PROTIME-INR
INR: 2.6 (ref 2.00–3.50)
PROTIME: 31.2 s — AB (ref 10.6–13.4)

## 2015-09-15 MED ORDER — DARBEPOETIN ALFA 500 MCG/ML IJ SOSY
500.0000 ug | PREFILLED_SYRINGE | Freq: Once | INTRAMUSCULAR | Status: AC
  Administered 2015-09-15: 500 ug via SUBCUTANEOUS
  Filled 2015-09-15: qty 1

## 2015-09-15 MED ORDER — INFLUENZA VAC SPLIT QUAD 0.5 ML IM SUSY
0.5000 mL | PREFILLED_SYRINGE | Freq: Once | INTRAMUSCULAR | Status: AC
  Administered 2015-09-15: 0.5 mL via INTRAMUSCULAR
  Filled 2015-09-15: qty 0.5

## 2015-09-15 NOTE — Assessment & Plan Note (Signed)
We discussed the importance of preventive care and reviewed the vaccination programs. He does not have any prior allergic reactions to influenza vaccination. He agrees to proceed with influenza vaccination today and we will administer it today at the clinic.  

## 2015-09-15 NOTE — Telephone Encounter (Signed)
per pof to sch pt appt-gave pt copy of avs °

## 2015-09-15 NOTE — Assessment & Plan Note (Signed)
He had recent skin cancer removed and is scheduled for Mohs procedure on 09/19/2015. The location would be on the right ear. It is okay for him to continue anticoagulation therapy for this procedure

## 2015-09-15 NOTE — Assessment & Plan Note (Signed)
He has severe hearing loss after recent bleeding from the left ear related to supratherapeutic INR. Examination of the left ear revealed a clot in the auditory canal. He has ENT appointment pending. Recommend he keeps that appointment.

## 2015-09-15 NOTE — Progress Notes (Signed)
[facility] OFFICE PROGRESS NOTE  Patient Care Team: Biagio Borg, MD as PCP - General (Internal Medicine) Heath Lark, MD as PCP - Hematology/Oncology (Hematology and Oncology) Collene Gobble, MD as Referring Physician (Pulmonary Disease) Minus Breeding, MD as Attending Physician (Cardiology) Elayne Snare, MD as Attending Physician (Endocrinology) Evans Lance, MD as Consulting Physician (Cardiology)  SUMMARY OF ONCOLOGIC HISTORY:  This is a patient who has been followed here for many years. He had history of chronic anemia for some time and had a bone marrow aspirate and biopsy done in 2007 that was unremarkable. Due to chronic anemia and chronic kidney disease he was referred to the [facility] to receive erythropoietin stimulating agents. Due to iron deficiency anemia, he had received one dose of intravenous iron infusion in January 2014 and darbepoetin injection since June of 2014. On 10/30/2013, he had bone marrow aspirate and biopsy showed dyspoietic changes suggested for early myelodysplastic syndrome. We resume erythropoietin stimulating agents every other week to keep hemoglobin above 10 g. the frequency of erythropoietin stimulating agents is subsequently changed to every 4 weeks. In April 2015, the patient had permanent pacemaker implantation for irregular heartbeat and congestive heart failure. He placed this on anticoagulation therapy with warfarin to prevent stroke On 02/01/2015, he was found to have acute DVT on the left lower extremity, in the setting of cellulitis and trauma with subtherapeutic INR On 03/30/2015, Aranesp injection dose is increased to 500 g every 3 weeks In August 2016, the patient was admitted to the hospital due to supratherapeutic INR due to medication interaction and have slight bleeding complication with perforated ear drum and left sided hearing loss  INTERVAL HISTORY: Please see below for problem oriented charting. He feels well apart  from persistent left-sided hearing loss. He had multiple skin cancers removed and is scheduled for Mohs procedure next week. The patient denies any recent signs or symptoms of bleeding such as spontaneous epistaxis, hematuria or hematochezia. Denies recent infection. He has chronic bilateral lower extremity edema. REVIEW OF SYSTEMS:   Constitutional: Denies fevers, chills or abnormal weight loss Eyes: Denies blurriness of vision Ears, nose, mouth, throat, and face: Denies mucositis or sore throat Respiratory: Denies cough, dyspnea or wheezes Cardiovascular: Denies palpitation, chest discomfort  Gastrointestinal:  Denies nausea, heartburn or change in bowel habits Lymphatics: Denies new lymphadenopathy  Neurological:Denies numbness, tingling or new weaknesses Behavioral/Psych: Mood is stable, no new changes  All other systems were reviewed with the patient and are negative.  I have reviewed the past medical history, past surgical history, social history and family history with the patient and they are unchanged from previous note.  ALLERGIES:  is allergic to keflex; codeine; ramipril; and vancomycin.  MEDICATIONS:  Current Outpatient Prescriptions  Medication Sig Dispense Refill  . ACCU-CHEK AVIVA PLUS test strip USE ONCE DAILY AS DIRECTED 50 each 5  . acetaminophen (TYLENOL) 500 MG tablet Take 1,000 mg by mouth every 8 (eight) hours as needed for moderate pain or headache.     . Calcium Carbonate-Vit D-Min (CALCIUM 1200 PO) Take 1,200 mg by mouth daily.     . Cholecalciferol (VITAMIN D) 1000 UNITS capsule Take 1,000 Units by mouth daily.     . clotrimazole-betamethasone (LOTRISONE) cream Apply 1 application topically daily.    Marland Kitchen docusate sodium (COLACE) 100 MG capsule Take 100 mg by mouth 2 (two) times daily.    Marland Kitchen doxazosin (CARDURA) 1 MG tablet Take 1 mg by mouth daily.    Marland Kitchen  Flaxseed, Linseed, (FLAX SEED OIL PO) Take 1 tablet by mouth daily.     . fluorouracil (EFUDEX) 5 % cream  Apply 1 application topically daily as needed (rash).   2  . furosemide (LASIX) 80 MG tablet TAKE 1 TABLET BY MOUTH EVERY DAY 30 tablet 3  . levothyroxine (SYNTHROID, LEVOTHROID) 150 MCG tablet TAKE 1 TABLET BY MOUTH EVERY MORNING ON AN EMPTY STOMACH 30 tablet 5  . losartan (COZAAR) 50 MG tablet Take 25 mg by mouth daily.    . Misc Natural Products (OSTEO BI-FLEX JOINT SHIELD PO) Take 1 tablet by mouth 2 (two) times daily.     Marland Kitchen oxybutynin (DITROPAN) 5 MG tablet Take 5 mg by mouth every 6 (six) hours as needed for bladder spasms.     . polyethylene glycol (MIRALAX / GLYCOLAX) packet Take 17 g by mouth daily as needed for mild constipation.     . Pyridoxine HCl (VITAMIN B-6) 250 MG tablet Take 250 mg by mouth daily.    Marland Kitchen tobramycin (TOBREX) 0.3 % ophthalmic solution Place 1 drop into both eyes 2 (two) times daily as needed (stinging).     . vitamin B-12 (CYANOCOBALAMIN) 1000 MCG tablet Take 1,000 mcg by mouth daily.    . vitamin C (ASCORBIC ACID) 500 MG tablet Take 500 mg by mouth at bedtime.     Marland Kitchen warfarin (COUMADIN) 2.5 MG tablet Take as directed by anticoagulation clinic 35 tablet 3   No current facility-administered medications for this visit.    PHYSICAL EXAMINATION: ECOG PERFORMANCE STATUS: 2 - Symptomatic, <50% confined to bed  Filed Vitals:   09/15/15 1334  BP: 132/56  Pulse: 60  Temp: 97.6 F (36.4 C)  Resp: 18   Filed Weights   09/15/15 1334  Weight: 145 lb 9.6 oz (66.044 kg)    GENERAL:alert, no distress and comfortable SKIN: Multiple skin lesions and bruises are noted.  EYES: normal, Conjunctiva are pink and non-injected, sclera clear OROPHARYNX:no exudate, no erythema and lips, buccal mucosa, and tongue normal . The left ear is examined. Normal external auditory canal. There is a large blood clot at the end of the auditory canal. HEART: Bilateral lower extremity edema ANEURO: alert & oriented x 3 with fluent speech, no focal motor/sensory deficits  LABORATORY DATA:   I have reviewed the data as listed    Component Value Date/Time   NA 136 08/02/2015 0952   NA 139 11/17/2014 1427   K 5.2* 08/02/2015 0952   K 4.5 11/17/2014 1427   CL 105 08/02/2015 0952   CL 104 06/19/2013 1517   CO2 23 08/02/2015 0952   CO2 23 11/17/2014 1427   GLUCOSE 151* 08/02/2015 0952   GLUCOSE 141* 11/17/2014 1427   GLUCOSE 90 06/19/2013 1517   BUN 58* 08/02/2015 0952   BUN 87.1* 11/17/2014 1427   CREATININE 1.76* 08/02/2015 0952   CREATININE 2.0* 11/17/2014 1427   CALCIUM 8.9 08/02/2015 0952   CALCIUM 9.0 11/17/2014 1427   PROT 7.2 04/04/2015 1515   PROT 7.2 11/17/2014 1427   ALBUMIN 3.8 04/04/2015 1515   ALBUMIN 3.6 11/17/2014 1427   AST 18 04/04/2015 1515   AST 17 11/17/2014 1427   ALT 15 04/04/2015 1515   ALT 14 11/17/2014 1427   ALKPHOS 109 04/04/2015 1515   ALKPHOS 126 11/17/2014 1427   BILITOT 0.5 04/04/2015 1515   BILITOT 0.80 11/17/2014 1427   GFRNONAA 33* 08/02/2015 0952   GFRAA 39* 08/02/2015 0952    No results found for: SPEP,  UPEP  Lab Results  Component Value Date   WBC 7.0 09/15/2015   NEUTROABS 4.7 09/15/2015   HGB 10.0* 09/15/2015   HCT 32.6* 09/15/2015   MCV 92.9 09/15/2015   PLT 150 09/15/2015      Chemistry      Component Value Date/Time   NA 136 08/02/2015 0952   NA 139 11/17/2014 1427   K 5.2* 08/02/2015 0952   K 4.5 11/17/2014 1427   CL 105 08/02/2015 0952   CL 104 06/19/2013 1517   CO2 23 08/02/2015 0952   CO2 23 11/17/2014 1427   BUN 58* 08/02/2015 0952   BUN 87.1* 11/17/2014 1427   CREATININE 1.76* 08/02/2015 0952   CREATININE 2.0* 11/17/2014 1427      Component Value Date/Time   CALCIUM 8.9 08/02/2015 0952   CALCIUM 9.0 11/17/2014 1427   ALKPHOS 109 04/04/2015 1515   ALKPHOS 126 11/17/2014 1427   AST 18 04/04/2015 1515   AST 17 11/17/2014 1427   ALT 15 04/04/2015 1515   ALT 14 11/17/2014 1427   BILITOT 0.5 04/04/2015 1515   BILITOT 0.80 11/17/2014 1427      ASSESSMENT & PLAN:  MDS (myelodysplastic  syndrome) The patient is doing very well on darbepoetin injection. He will continue Aranesp injection to keep hemoglobin above 11 g.    Acute deep vein thrombosis (DVT) of other specified vein of left lower extremity I felt that this acute event is precipitated by recent cellulitis and trauma to the leg. Goal therapy would be minimum 3 months for DVT but lifelong for chronic atrial fibrillation. Goal INR would be 2-3. He is doing well, INR therapeutic without bleeding complications except for recent hospitalization.    Left ear hearing loss He has severe hearing loss after recent bleeding from the left ear related to supratherapeutic INR. Examination of the left ear revealed a clot in the auditory canal. He has ENT appointment pending. Recommend he keeps that appointment.  Skin cancer He had recent skin cancer removed and is scheduled for Mohs procedure on 09/19/2015. The location would be on the right ear. It is okay for him to continue anticoagulation therapy for this procedure  Preventive measure We discussed the importance of preventive care and reviewed the vaccination programs. He does not have any prior allergic reactions to influenza vaccination. He agrees to proceed with influenza vaccination today and we will administer it today at the clinic.    No orders of the defined types were placed in this encounter.   All questions were answered. The patient knows to call the clinic with any problems, questions or concerns. No barriers to learning was detected. I spent 20 minutes counseling the patient face to face. The total time spent in the appointment was 25 minutes and more than 50% was on counseling and review of test results     [facility], [place], MD 09/15/2015 2:11 PM

## 2015-09-15 NOTE — Assessment & Plan Note (Signed)
The patient is doing very well on darbepoetin injection. He will continue Aranesp injection to keep hemoglobin above 11 g.

## 2015-09-15 NOTE — Assessment & Plan Note (Signed)
I felt that this acute event is precipitated by recent cellulitis and trauma to the leg. Goal therapy would be minimum 3 months for DVT but lifelong for chronic atrial fibrillation. Goal INR would be 2-3. He is doing well, INR therapeutic without bleeding complications except for recent hospitalization.

## 2015-09-20 ENCOUNTER — Telehealth: Payer: Self-pay | Admitting: *Deleted

## 2015-09-20 NOTE — Telephone Encounter (Signed)
Wife left message stating she feels like she needs Hospice again- respite care. She is not able to talk honestly to Dr Alvy Bimler in front of patient as he denies having any issues. Pt is frequently incotinent in bathroom, getting worse. Wants to have [facility] this time. For some reason, last time they had [facility]  Had Mohs surgery on skin cancer yesterday- says doctor saw lots of "aggressive" cancer cells. Possibly more surgery needed.

## 2015-09-21 ENCOUNTER — Telehealth: Payer: Self-pay | Admitting: *Deleted

## 2015-09-21 NOTE — Telephone Encounter (Signed)
Please contact [facility] refer Would they allow him to continue Aranesp inj?

## 2015-09-21 NOTE — Telephone Encounter (Signed)
Wife called back and I informed her of referral made to [facility].  Informed her unsure if they will cover Aranesp or not or if Medicare will continue to cover if pt on Hospice.   Hospice can let them know this before he is admitted.  Wife verbalized understanding.   She reports Skin Surgeon said there were  "a lot of leukemic cells"  In the skin cancer and that it is "aggresive."   Suggested wife have them send Pathology report to Dr. Alvy Bimler.  She says she has requested they send report to Dr. Alvy Bimler.

## 2015-09-21 NOTE — Telephone Encounter (Signed)
Wife called and requested Hospice Referral.  Ok per Dr. Alvy Bimler although she is not sure if they will cover pt's Aranesp injections he gets for MDS.   Dr. Alvy Bimler says pt has several diagnosis that together qualify him for Hospice.  She feels his most debilitating diagnosis at this time is the COPD.    LVM for wife informing her will will make Hospice referral please call us back if any questions. Called Referral to [facility].  They will contact pt's wife for assessment appointment.   Hospice Medical Director can make decision about whether or not Aranesp will be covered by Hospice or can it still be covered by pt's regular Medicare.   Asked Tye Maryland to please let family and Dr. Alvy Bimler know if aranesp will not be covered.  We plan to continue it at this time unless notified otherwise.

## 2015-09-22 ENCOUNTER — Ambulatory Visit: Payer: Medicare Other | Admitting: Endocrinology

## 2015-09-23 ENCOUNTER — Telehealth: Payer: Self-pay | Admitting: *Deleted

## 2015-09-23 NOTE — Telephone Encounter (Signed)
Call from Dr. Tomasa Hosteller, Medical Director w/ Hospice.  He has reviewed pt's chart and states although pt is likely eligible for Hospice services, they would not be able to admit him if he continues Aranesp injections.  The Aranesp would not be covered.  It is not considered Palliative treatment.    Informed Dr. Alvy Bimler of above and she states the decision should be up to pt and his wife.  S/w wife and she says she thinks the Aranesp is still benefiting pt and giving him some energy.  She wants to continue Aranesp at this time for pt and will not pursue Hospice yet.  She is interested in finding out if there are any other avenues for getting some Respite care possibly through Nursing Facilities.   I informed her I will ask our SW Polo Riley to call her w/ any information or resources for respite care outside of Hospice.  She agreed.

## 2015-09-28 ENCOUNTER — Encounter: Payer: Self-pay | Admitting: *Deleted

## 2015-09-28 NOTE — Progress Notes (Signed)
[facility] Work  Clinical Social Work was referred by medical oncology RN for assessment of psychosocial needs.  Clinical Social Worker contacted patient's spouse to offer support and assess for needs.  Patient's spouse requested possible resources for respite care.  She explained the patient is currently not eligible for Hospice because he will be continuing on Aranesp shots.  CSW and patient's spouse discussed possible resources including [facility] respite care and home care which are both out of pocket.  Patient's spouse indicated self pay options are not viable for their family at this time.      Clinical Social Work interventions:  Douglas George, MSW, LCSW, OSW-C Clinical Social Worker [facility] [phone]

## 2015-09-29 ENCOUNTER — Ambulatory Visit (INDEPENDENT_AMBULATORY_CARE_PROVIDER_SITE_OTHER): Payer: Medicare Other | Admitting: *Deleted

## 2015-09-29 DIAGNOSIS — I442 Atrioventricular block, complete: Secondary | ICD-10-CM

## 2015-09-29 NOTE — Progress Notes (Signed)
Remote pacemaker transmission.   

## 2015-09-30 ENCOUNTER — Telehealth: Payer: Self-pay | Admitting: Student

## 2015-09-30 NOTE — Telephone Encounter (Signed)
Entered in Error. Please look at previous Telephone Encounter from 09/30/2015 for correct information.

## 2015-09-30 NOTE — Telephone Encounter (Signed)
Mrs. Plush called due to her husband falling backwards and hitting his head against the wall this evening and wondering if he should go to the Emergency Department for evaluation.  Mr. Onorato is on Coumadin for chronic atrial fibrillation with his last INR being checked on 09/15/2015 and therapeutic at that time (2.60).   She reports he has developed a "large knot" on the back of his head. Denies any loss of consciousness, confusion, AMS, lethargy, nausea, or vomiting. She is also concerned about a "swollen vein" on his left arm.  Instructed her we do recommended he come to the ED for evaluation and make sure he does not have a head bleed due to his recent fall and being on Coumadin.  She is planning to take him to [facility] this evening.  Signed, Erma Heritage, PA-C 09/30/2015, 6:32 PM Pager: [phone]

## 2015-10-03 ENCOUNTER — Other Ambulatory Visit: Payer: Medicare Other

## 2015-10-04 ENCOUNTER — Other Ambulatory Visit: Payer: Self-pay | Admitting: Dermatology

## 2015-10-05 ENCOUNTER — Telehealth: Payer: Self-pay | Admitting: *Deleted

## 2015-10-05 ENCOUNTER — Ambulatory Visit (HOSPITAL_BASED_OUTPATIENT_CLINIC_OR_DEPARTMENT_OTHER): Payer: Medicare Other

## 2015-10-05 ENCOUNTER — Ambulatory Visit: Payer: Medicare Other | Admitting: *Deleted

## 2015-10-05 ENCOUNTER — Other Ambulatory Visit (HOSPITAL_BASED_OUTPATIENT_CLINIC_OR_DEPARTMENT_OTHER): Payer: Medicare Other

## 2015-10-05 VITALS — BP 135/33 | HR 60 | Temp 97.8°F | Resp 16

## 2015-10-05 DIAGNOSIS — D469 Myelodysplastic syndrome, unspecified: Secondary | ICD-10-CM

## 2015-10-05 DIAGNOSIS — Z86718 Personal history of other venous thrombosis and embolism: Secondary | ICD-10-CM | POA: Diagnosis not present

## 2015-10-05 DIAGNOSIS — D630 Anemia in neoplastic disease: Secondary | ICD-10-CM

## 2015-10-05 DIAGNOSIS — I82439 Acute embolism and thrombosis of unspecified popliteal vein: Secondary | ICD-10-CM

## 2015-10-05 DIAGNOSIS — I82492 Acute embolism and thrombosis of other specified deep vein of left lower extremity: Secondary | ICD-10-CM

## 2015-10-05 DIAGNOSIS — Z5181 Encounter for therapeutic drug level monitoring: Secondary | ICD-10-CM

## 2015-10-05 LAB — CBC & DIFF AND RETIC
BASO%: 0.7 % (ref 0.0–2.0)
BASOS ABS: 0.1 10*3/uL (ref 0.0–0.1)
EOS ABS: 0.2 10*3/uL (ref 0.0–0.5)
EOS%: 3 % (ref 0.0–7.0)
HEMATOCRIT: 31.6 % — AB (ref 38.4–49.9)
HEMOGLOBIN: 9.8 g/dL — AB (ref 13.0–17.1)
Immature Retic Fract: 3.3 % (ref 3.00–10.60)
LYMPH#: 1 10*3/uL (ref 0.9–3.3)
LYMPH%: 12.2 % — ABNORMAL LOW (ref 14.0–49.0)
MCH: 29.2 pg (ref 27.2–33.4)
MCHC: 31 g/dL — AB (ref 32.0–36.0)
MCV: 94 fL (ref 79.3–98.0)
MONO#: 1 10*3/uL — AB (ref 0.1–0.9)
MONO%: 11.7 % (ref 0.0–14.0)
NEUT#: 5.9 10*3/uL (ref 1.5–6.5)
NEUT%: 72.4 % (ref 39.0–75.0)
NRBC: 0 % (ref 0–0)
Platelets: 204 10*3/uL (ref 140–400)
RBC: 3.36 10*6/uL — ABNORMAL LOW (ref 4.20–5.82)
RDW: 19.2 % — AB (ref 11.0–14.6)
RETIC %: 0.91 % (ref 0.80–1.80)
RETIC CT ABS: 30.58 10*3/uL — AB (ref 34.80–93.90)
WBC: 8.1 10*3/uL (ref 4.0–10.3)

## 2015-10-05 LAB — PROTIME-INR
INR: 1.6 — ABNORMAL LOW (ref 2.00–3.50)
Protime: 19.2 Seconds — ABNORMAL HIGH (ref 10.6–13.4)

## 2015-10-05 LAB — POCT INR: INR: 1.6

## 2015-10-05 MED ORDER — DARBEPOETIN ALFA 500 MCG/ML IJ SOSY
500.0000 ug | PREFILLED_SYRINGE | Freq: Once | INTRAMUSCULAR | Status: AC
  Administered 2015-10-05: 500 ug via SUBCUTANEOUS
  Filled 2015-10-05: qty 1

## 2015-10-05 NOTE — Progress Notes (Signed)
Cameo will call family with instructions for coumadin later today.

## 2015-10-05 NOTE — Telephone Encounter (Signed)
Duplicate note

## 2015-10-05 NOTE — Progress Notes (Signed)
Wife verbalized understanding of coumadin dose change.  Denies need for any refills at this time.

## 2015-10-10 ENCOUNTER — Ambulatory Visit: Payer: Medicare Other | Admitting: Endocrinology

## 2015-10-12 ENCOUNTER — Ambulatory Visit: Payer: Medicare Other | Admitting: Hematology and Oncology

## 2015-10-13 ENCOUNTER — Other Ambulatory Visit: Payer: Self-pay

## 2015-10-13 DIAGNOSIS — I10 Essential (primary) hypertension: Secondary | ICD-10-CM

## 2015-10-13 NOTE — Telephone Encounter (Signed)
Send in 50 mg please.

## 2015-10-14 LAB — CUP PACEART REMOTE DEVICE CHECK
Brady Statistic RV Percent Paced: 81 %
Lead Channel Setting Pacing Amplitude: 2.5 V
Lead Channel Setting Pacing Pulse Width: 0.5 ms
Lead Channel Setting Sensing Sensitivity: 2 mV
MDC IDC LEAD IMPLANT DT: 20150422
MDC IDC LEAD LOCATION: 753860
MDC IDC PG MODEL: 1160
MDC IDC SESS DTM: 20161014092833
Pulse Gen Serial Number: 7505109

## 2015-10-14 MED ORDER — LOSARTAN POTASSIUM 50 MG PO TABS: 50.0000 mg | ORAL_TABLET | Freq: Every day | ORAL | Status: DC

## 2015-10-24 ENCOUNTER — Other Ambulatory Visit: Payer: Medicare Other

## 2015-10-26 ENCOUNTER — Ambulatory Visit (HOSPITAL_BASED_OUTPATIENT_CLINIC_OR_DEPARTMENT_OTHER): Payer: Medicare Other

## 2015-10-26 ENCOUNTER — Other Ambulatory Visit (HOSPITAL_COMMUNITY)
Admission: RE | Admit: 2015-10-26 | Discharge: 2015-10-26 | Disposition: A | Discharge status: Home / Self Care | Payer: Medicare Other | Source: Other Acute Inpatient Hospital | Attending: Hematology and Oncology | Admitting: Hematology and Oncology

## 2015-10-26 ENCOUNTER — Other Ambulatory Visit (HOSPITAL_COMMUNITY)
Admission: RE | Admit: 2015-10-26 | Discharge: 2015-10-26 | Disposition: A | Discharge status: Home / Self Care | Payer: Medicare Other | Source: Ambulatory Visit | Attending: Hematology and Oncology | Admitting: Hematology and Oncology

## 2015-10-26 ENCOUNTER — Other Ambulatory Visit (HOSPITAL_BASED_OUTPATIENT_CLINIC_OR_DEPARTMENT_OTHER): Payer: Medicare Other

## 2015-10-26 ENCOUNTER — Telehealth: Payer: Self-pay | Admitting: *Deleted

## 2015-10-26 ENCOUNTER — Other Ambulatory Visit: Payer: Self-pay | Admitting: *Deleted

## 2015-10-26 VITALS — BP 148/57 | HR 59 | Temp 98.2°F

## 2015-10-26 DIAGNOSIS — I82429 Acute embolism and thrombosis of unspecified iliac vein: Secondary | ICD-10-CM | POA: Insufficient documentation

## 2015-10-26 DIAGNOSIS — D469 Myelodysplastic syndrome, unspecified: Secondary | ICD-10-CM | POA: Diagnosis not present

## 2015-10-26 DIAGNOSIS — D630 Anemia in neoplastic disease: Secondary | ICD-10-CM

## 2015-10-26 DIAGNOSIS — I82439 Acute embolism and thrombosis of unspecified popliteal vein: Secondary | ICD-10-CM

## 2015-10-26 LAB — CBC & DIFF AND RETIC
BASO%: 0.6 % (ref 0.0–2.0)
Basophils Absolute: 0 10*3/uL (ref 0.0–0.1)
EOS%: 4.2 % (ref 0.0–7.0)
Eosinophils Absolute: 0.3 10*3/uL (ref 0.0–0.5)
HCT: 32.5 % — ABNORMAL LOW (ref 38.4–49.9)
HEMOGLOBIN: 10 g/dL — AB (ref 13.0–17.1)
Immature Retic Fract: 4 % (ref 3.00–10.60)
LYMPH%: 15.3 % (ref 14.0–49.0)
MCH: 29.2 pg (ref 27.2–33.4)
MCHC: 30.8 g/dL — ABNORMAL LOW (ref 32.0–36.0)
MCV: 95 fL (ref 79.3–98.0)
MONO#: 1 10*3/uL — AB (ref 0.1–0.9)
MONO%: 15.7 % — AB (ref 0.0–14.0)
NEUT%: 64.2 % (ref 39.0–75.0)
NEUTROS ABS: 4.1 10*3/uL (ref 1.5–6.5)
PLATELETS: 176 10*3/uL (ref 140–400)
RBC: 3.42 10*6/uL — AB (ref 4.20–5.82)
RDW: 18.3 % — ABNORMAL HIGH (ref 11.0–14.6)
Retic %: 0.58 % — ABNORMAL LOW (ref 0.80–1.80)
Retic Ct Abs: 19.84 10*3/uL — ABNORMAL LOW (ref 34.80–93.90)
WBC: 6.5 10*3/uL (ref 4.0–10.3)
lymph#: 1 10*3/uL (ref 0.9–3.3)

## 2015-10-26 LAB — PROTIME-INR
INR: 4.81 — ABNORMAL HIGH (ref 0.00–1.49)
Prothrombin Time: 43.6 seconds — ABNORMAL HIGH (ref 11.6–15.2)

## 2015-10-26 MED ORDER — DARBEPOETIN ALFA 500 MCG/ML IJ SOSY
500.0000 ug | PREFILLED_SYRINGE | Freq: Once | INTRAMUSCULAR | Status: AC
  Administered 2015-10-26: 500 ug via SUBCUTANEOUS
  Filled 2015-10-26: qty 1

## 2015-10-26 NOTE — Telephone Encounter (Signed)
For Friday, if  1) INR is >3, continue to hold warfarin and recheck Monday 2) INR is <3, resume warfarin but modify to 2.5 mg daily and recheck in 1 week

## 2015-10-26 NOTE — Telephone Encounter (Signed)
Informed wife of INR 4.8.  Wife states no bleeding today,  Pt did have some bleeding in his gums a few days ago.  Instructed pt to hold Coumadin and return on Friday for lab per Dr. Alvy Bimler. Do not take any coumadin until next lab and wait for further instructions.   If pt has any bleeding that doesn't stop take him to ED.  Wife verbalized understanding and lab scheduled for Friday at 2:30 pm..

## 2015-10-27 ENCOUNTER — Ambulatory Visit: Payer: Medicare Other | Admitting: Endocrinology

## 2015-10-28 ENCOUNTER — Ambulatory Visit: Payer: Self-pay | Admitting: *Deleted

## 2015-10-28 ENCOUNTER — Encounter: Payer: Self-pay | Admitting: Cardiology

## 2015-10-28 ENCOUNTER — Other Ambulatory Visit: Payer: Self-pay | Admitting: *Deleted

## 2015-10-28 ENCOUNTER — Other Ambulatory Visit (HOSPITAL_BASED_OUTPATIENT_CLINIC_OR_DEPARTMENT_OTHER): Payer: Medicare Other

## 2015-10-28 ENCOUNTER — Telehealth: Payer: Self-pay | Admitting: Hematology and Oncology

## 2015-10-28 DIAGNOSIS — D469 Myelodysplastic syndrome, unspecified: Secondary | ICD-10-CM

## 2015-10-28 DIAGNOSIS — I82439 Acute embolism and thrombosis of unspecified popliteal vein: Secondary | ICD-10-CM

## 2015-10-28 DIAGNOSIS — Z5181 Encounter for therapeutic drug level monitoring: Secondary | ICD-10-CM

## 2015-10-28 DIAGNOSIS — I82492 Acute embolism and thrombosis of other specified deep vein of left lower extremity: Secondary | ICD-10-CM

## 2015-10-28 DIAGNOSIS — Z86718 Personal history of other venous thrombosis and embolism: Secondary | ICD-10-CM | POA: Diagnosis not present

## 2015-10-28 LAB — CBC & DIFF AND RETIC
BASO%: 0.5 % (ref 0.0–2.0)
Basophils Absolute: 0 10*3/uL (ref 0.0–0.1)
EOS%: 3.5 % (ref 0.0–7.0)
Eosinophils Absolute: 0.2 10*3/uL (ref 0.0–0.5)
HCT: 32.9 % — ABNORMAL LOW (ref 38.4–49.9)
HGB: 10.2 g/dL — ABNORMAL LOW (ref 13.0–17.1)
Immature Retic Fract: 5 % (ref 3.00–10.60)
LYMPH#: 1 10*3/uL (ref 0.9–3.3)
LYMPH%: 14.4 % (ref 14.0–49.0)
MCH: 29.7 pg (ref 27.2–33.4)
MCHC: 31 g/dL — AB (ref 32.0–36.0)
MCV: 95.9 fL (ref 79.3–98.0)
MONO#: 0.8 10*3/uL (ref 0.1–0.9)
MONO%: 11.3 % (ref 0.0–14.0)
NEUT%: 70.3 % (ref 39.0–75.0)
NEUTROS ABS: 4.7 10*3/uL (ref 1.5–6.5)
PLATELETS: 170 10*3/uL (ref 140–400)
RBC: 3.43 10*6/uL — AB (ref 4.20–5.82)
RDW: 18.2 % — ABNORMAL HIGH (ref 11.0–14.6)
RETIC CT ABS: 27.1 10*3/uL — AB (ref 34.80–93.90)
Retic %: 0.79 % — ABNORMAL LOW (ref 0.80–1.80)
WBC: 6.6 10*3/uL (ref 4.0–10.3)

## 2015-10-28 LAB — PROTIME-INR
INR: 2.9 (ref 2.00–3.50)
Protime: 34.8 Seconds — ABNORMAL HIGH (ref 10.6–13.4)

## 2015-10-28 NOTE — Telephone Encounter (Signed)
Lab for 11/4 per 10/28 pof already on schedule. Gave pt/dtr avs report and appointments.

## 2015-10-28 NOTE — Progress Notes (Signed)
Instructed wife/pt to resume Coumadin 2.5 mg daily and make appt for lab in one week on 11/4.  Written instructions given and sent to scheduler to make the lab appt.

## 2015-11-02 ENCOUNTER — Encounter: Payer: Self-pay | Admitting: Internal Medicine

## 2015-11-04 ENCOUNTER — Telehealth: Payer: Self-pay | Admitting: *Deleted

## 2015-11-04 ENCOUNTER — Other Ambulatory Visit (HOSPITAL_BASED_OUTPATIENT_CLINIC_OR_DEPARTMENT_OTHER): Payer: Medicare Other

## 2015-11-04 ENCOUNTER — Ambulatory Visit: Payer: Medicare Other | Admitting: *Deleted

## 2015-11-04 DIAGNOSIS — D631 Anemia in chronic kidney disease: Secondary | ICD-10-CM | POA: Diagnosis not present

## 2015-11-04 DIAGNOSIS — N189 Chronic kidney disease, unspecified: Secondary | ICD-10-CM | POA: Diagnosis not present

## 2015-11-04 DIAGNOSIS — I82439 Acute embolism and thrombosis of unspecified popliteal vein: Secondary | ICD-10-CM

## 2015-11-04 DIAGNOSIS — I82492 Acute embolism and thrombosis of other specified deep vein of left lower extremity: Secondary | ICD-10-CM | POA: Diagnosis not present

## 2015-11-04 DIAGNOSIS — D469 Myelodysplastic syndrome, unspecified: Secondary | ICD-10-CM

## 2015-11-04 LAB — CBC & DIFF AND RETIC
BASO%: 0.6 % (ref 0.0–2.0)
BASOS ABS: 0 10*3/uL (ref 0.0–0.1)
EOS%: 3.3 % (ref 0.0–7.0)
Eosinophils Absolute: 0.2 10*3/uL (ref 0.0–0.5)
HCT: 32.3 % — ABNORMAL LOW (ref 38.4–49.9)
HEMOGLOBIN: 9.9 g/dL — AB (ref 13.0–17.1)
IMMATURE RETIC FRACT: 15.7 % — AB (ref 3.00–10.60)
LYMPH%: 14.1 % (ref 14.0–49.0)
MCH: 29.3 pg (ref 27.2–33.4)
MCHC: 30.7 g/dL — ABNORMAL LOW (ref 32.0–36.0)
MCV: 95.6 fL (ref 79.3–98.0)
MONO#: 0.8 10*3/uL (ref 0.1–0.9)
MONO%: 11.5 % (ref 0.0–14.0)
NEUT#: 4.8 10*3/uL (ref 1.5–6.5)
NEUT%: 70.5 % (ref 39.0–75.0)
NRBC: 0 % (ref 0–0)
PLATELETS: 168 10*3/uL (ref 140–400)
RBC: 3.38 10*6/uL — ABNORMAL LOW (ref 4.20–5.82)
RDW: 19 % — AB (ref 11.0–14.6)
RETIC CT ABS: 82.47 10*3/uL (ref 34.80–93.90)
Retic %: 2.44 % — ABNORMAL HIGH (ref 0.80–1.80)
WBC: 6.8 10*3/uL (ref 4.0–10.3)
lymph#: 1 10*3/uL (ref 0.9–3.3)

## 2015-11-04 LAB — PROTIME-INR
INR: 2.5 (ref 2.00–3.50)
Protime: 30 Seconds — ABNORMAL HIGH (ref 10.6–13.4)

## 2015-11-04 LAB — POCT INR: INR: 2.5

## 2015-11-04 NOTE — Patient Instructions (Signed)
Continue same dose of warfarin

## 2015-11-04 NOTE — Telephone Encounter (Signed)
Left message for wife-pt to continue same dose of warfarin. Call if any questions

## 2015-11-11 ENCOUNTER — Encounter: Payer: Self-pay | Admitting: Cardiology

## 2015-11-16 ENCOUNTER — Other Ambulatory Visit (HOSPITAL_BASED_OUTPATIENT_CLINIC_OR_DEPARTMENT_OTHER): Payer: Medicare Other

## 2015-11-16 ENCOUNTER — Ambulatory Visit: Payer: Self-pay | Admitting: *Deleted

## 2015-11-16 ENCOUNTER — Ambulatory Visit (HOSPITAL_BASED_OUTPATIENT_CLINIC_OR_DEPARTMENT_OTHER): Payer: Medicare Other

## 2015-11-16 VITALS — BP 128/39 | HR 59 | Temp 98.0°F

## 2015-11-16 DIAGNOSIS — D469 Myelodysplastic syndrome, unspecified: Secondary | ICD-10-CM

## 2015-11-16 DIAGNOSIS — I82492 Acute embolism and thrombosis of other specified deep vein of left lower extremity: Secondary | ICD-10-CM | POA: Diagnosis not present

## 2015-11-16 DIAGNOSIS — D630 Anemia in neoplastic disease: Secondary | ICD-10-CM

## 2015-11-16 DIAGNOSIS — I82439 Acute embolism and thrombosis of unspecified popliteal vein: Secondary | ICD-10-CM

## 2015-11-16 LAB — CBC & DIFF AND RETIC
BASO%: 0.7 % (ref 0.0–2.0)
BASOS ABS: 0 10*3/uL (ref 0.0–0.1)
EOS%: 3.8 % (ref 0.0–7.0)
Eosinophils Absolute: 0.3 10*3/uL (ref 0.0–0.5)
HCT: 32.4 % — ABNORMAL LOW (ref 38.4–49.9)
HEMOGLOBIN: 10.1 g/dL — AB (ref 13.0–17.1)
Immature Retic Fract: 2.3 % — ABNORMAL LOW (ref 3.00–10.60)
LYMPH%: 15.2 % (ref 14.0–49.0)
MCH: 28.9 pg (ref 27.2–33.4)
MCHC: 31.3 g/dL — ABNORMAL LOW (ref 32.0–36.0)
MCV: 92.3 fL (ref 79.3–98.0)
MONO#: 1 10*3/uL — ABNORMAL HIGH (ref 0.1–0.9)
MONO%: 13.6 % (ref 0.0–14.0)
NEUT#: 4.8 10*3/uL (ref 1.5–6.5)
NEUT%: 66.7 % (ref 39.0–75.0)
NRBC: 0 % (ref 0–0)
Platelets: 135 10*3/uL — ABNORMAL LOW (ref 140–400)
RBC: 3.51 10*6/uL — ABNORMAL LOW (ref 4.20–5.82)
RDW: 19.7 % — AB (ref 11.0–14.6)
Retic %: 0.57 % — ABNORMAL LOW (ref 0.80–1.80)
Retic Ct Abs: 20.01 10*3/uL — ABNORMAL LOW (ref 34.80–93.90)
WBC: 7.1 10*3/uL (ref 4.0–10.3)
lymph#: 1.1 10*3/uL (ref 0.9–3.3)

## 2015-11-16 LAB — PROTIME-INR
INR: 2.2 (ref 2.00–3.50)
PROTIME: 26.4 s — AB (ref 10.6–13.4)

## 2015-11-16 MED ORDER — DARBEPOETIN ALFA 500 MCG/ML IJ SOSY
500.0000 ug | PREFILLED_SYRINGE | Freq: Once | INTRAMUSCULAR | Status: AC
  Administered 2015-11-16: 500 ug via SUBCUTANEOUS
  Filled 2015-11-16: qty 1

## 2015-11-16 NOTE — Progress Notes (Signed)
Continue same dose of coumadin

## 2015-11-28 ENCOUNTER — Encounter: Payer: Self-pay | Admitting: Emergency Medicine

## 2015-11-28 ENCOUNTER — Other Ambulatory Visit: Payer: Medicare Other

## 2015-11-28 ENCOUNTER — Ambulatory Visit (INDEPENDENT_AMBULATORY_CARE_PROVIDER_SITE_OTHER): Payer: Medicare Other | Admitting: Emergency Medicine

## 2015-11-28 VITALS — BP 116/70 | HR 60 | Wt 145.6 lb

## 2015-11-28 DIAGNOSIS — G4733 Obstructive sleep apnea (adult) (pediatric): Secondary | ICD-10-CM

## 2015-11-28 NOTE — Progress Notes (Signed)
  Subjective:    Patient ID: Douglas George, male    DOB: 04-04-1927, 81 y.o.   MRN: [id number]  HPI 82 yo former low exposure smoker (2 pk-yrs), hx DM, OSA (dx 15 yrs ago, not on CPAP),  LE DVT (his wife says these were while he was therapeutic on coumadin), HTN with restrictive diastolic dysfxn, CAD (RCA stent '98, LAD dz), moderate AI and MR, RV dilation and PAH by TTE in 2011 (estimated PASP 91mmHg) and then confirmed on TTE January 2/14 (estimated PASP 86 mmHg). Followed by Dr Percival Spanish for these issues and A fib on coumadin.  His most recent TTE was performed to evaluate LE edema, has improved since lasix was increased 1/16. He denies any dyspnea, although activity is limited by his joint disease.    ROV 04/28/15 -- follow up visit for OSA/OHS, BiPAP use for secondary PAH and chronic resp failure. He is having trouble with dry mouth and throat on BiPAP. 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   ROV 11/28/15 -- follow-up visit for secondary pulmonary hypertension and chronic respiratory failure in the setting of OSA/OHS. He has been using nocturnal BiPAP. He sees Dr Alvy Bimler for myelodysplastic syndrome. He is compliant with his BiPAP, wears it for about 5 hours a night. He rarely misses it. It does dry his nose and mouth - uses lip balm to moisturize.  He is able to be up and around, is limited some by joint pain and balance.      Objective:   Physical Exam There were no vitals filed for this visit.  Gen: Pleasant, well-nourished, in no distress,  normal affect  ENT: No lesions,  mouth clear,  oropharynx clear, no postnasal drip  Neck: No JVD, no TMG, no carotid bruits  Lungs: No use of accessory muscles, no dullness to percussion, clear without rales or rhonchi  Cardiovascular: RRR, heart sounds normal, no murmur or gallops, no peripheral edema  Musculoskeletal: No deformities, no cyanosis or clubbing  Neuro: alert, non focal  Skin: Warm, no  lesions or rashes      Assessment & Plan:    Obstructive sleep apnea Appears to be adequately controlled on his current BiPAP settings. I will not repeat an echocardiogram at this time. We will continue his same nocturnal support.

## 2015-11-28 NOTE — Assessment & Plan Note (Signed)
Appears to be adequately controlled on his current BiPAP settings. I will not repeat an echocardiogram at this time. We will continue his same nocturnal support.

## 2015-11-28 NOTE — Patient Instructions (Addendum)
Please continue to wear your BiPAP mask every night.  Stay as active as possible.   Call our office if you need any orders to get parts for your BiPAp mask / system.  Follow with Dr Lamonte Sakai in 6 months or sooner if you have any problems

## 2015-12-07 ENCOUNTER — Other Ambulatory Visit (HOSPITAL_BASED_OUTPATIENT_CLINIC_OR_DEPARTMENT_OTHER): Payer: Medicare Other

## 2015-12-07 ENCOUNTER — Ambulatory Visit: Payer: Self-pay | Admitting: *Deleted

## 2015-12-07 ENCOUNTER — Ambulatory Visit (HOSPITAL_BASED_OUTPATIENT_CLINIC_OR_DEPARTMENT_OTHER): Payer: Medicare Other

## 2015-12-07 VITALS — BP 153/49 | HR 60 | Temp 97.9°F

## 2015-12-07 DIAGNOSIS — N189 Chronic kidney disease, unspecified: Secondary | ICD-10-CM | POA: Diagnosis not present

## 2015-12-07 DIAGNOSIS — D631 Anemia in chronic kidney disease: Secondary | ICD-10-CM

## 2015-12-07 DIAGNOSIS — D630 Anemia in neoplastic disease: Secondary | ICD-10-CM

## 2015-12-07 DIAGNOSIS — I82439 Acute embolism and thrombosis of unspecified popliteal vein: Secondary | ICD-10-CM

## 2015-12-07 DIAGNOSIS — D469 Myelodysplastic syndrome, unspecified: Secondary | ICD-10-CM

## 2015-12-07 DIAGNOSIS — I82492 Acute embolism and thrombosis of other specified deep vein of left lower extremity: Secondary | ICD-10-CM

## 2015-12-07 DIAGNOSIS — Z5181 Encounter for therapeutic drug level monitoring: Secondary | ICD-10-CM

## 2015-12-07 LAB — CBC & DIFF AND RETIC
BASO%: 0.9 % (ref 0.0–2.0)
BASOS ABS: 0.1 10*3/uL (ref 0.0–0.1)
EOS ABS: 0.2 10*3/uL (ref 0.0–0.5)
EOS%: 3.6 % (ref 0.0–7.0)
HEMATOCRIT: 32.8 % — AB (ref 38.4–49.9)
HEMOGLOBIN: 10 g/dL — AB (ref 13.0–17.1)
IMMATURE RETIC FRACT: 3.2 % (ref 3.00–10.60)
LYMPH#: 1 10*3/uL (ref 0.9–3.3)
LYMPH%: 16.4 % (ref 14.0–49.0)
MCH: 28.7 pg (ref 27.2–33.4)
MCHC: 30.5 g/dL — ABNORMAL LOW (ref 32.0–36.0)
MCV: 94.3 fL (ref 79.3–98.0)
MONO#: 0.7 10*3/uL (ref 0.1–0.9)
MONO%: 12.6 % (ref 0.0–14.0)
NEUT#: 3.9 10*3/uL (ref 1.5–6.5)
NEUT%: 66.5 % (ref 39.0–75.0)
PLATELETS: 159 10*3/uL (ref 140–400)
RBC: 3.48 10*6/uL — ABNORMAL LOW (ref 4.20–5.82)
RDW: 18.7 % — ABNORMAL HIGH (ref 11.0–14.6)
RETIC CT ABS: 20.88 10*3/uL — AB (ref 34.80–93.90)
Retic %: 0.6 % — ABNORMAL LOW (ref 0.80–1.80)
WBC: 5.8 10*3/uL (ref 4.0–10.3)

## 2015-12-07 LAB — PROTIME-INR
INR: 2.9 (ref 2.00–3.50)
Protime: 34.8 Seconds — ABNORMAL HIGH (ref 10.6–13.4)

## 2015-12-07 MED ORDER — DARBEPOETIN ALFA 500 MCG/ML IJ SOSY
500.0000 ug | PREFILLED_SYRINGE | Freq: Once | INTRAMUSCULAR | Status: AC
Start: 2015-12-07 — End: 2015-12-07
  Administered 2015-12-07: 500 ug via SUBCUTANEOUS
  Filled 2015-12-07: qty 1

## 2015-12-18 ENCOUNTER — Other Ambulatory Visit: Payer: Self-pay | Admitting: Endocrinology

## 2015-12-18 ENCOUNTER — Other Ambulatory Visit: Payer: Self-pay | Admitting: Internal Medicine

## 2015-12-19 ENCOUNTER — Other Ambulatory Visit: Payer: Self-pay | Admitting: Cardiology

## 2015-12-20 NOTE — Telephone Encounter (Signed)
Medication Detail      Disp Refills Start End     losartan (COZAAR) 50 MG tablet 30 tablet 1 10/14/2015     Sig - Route: Take 1 tablet (50 mg total) by mouth daily. - Oral    Notes to Pharmacy: Call and schedule follow up office visit to receive further refills. [phone]. Thank you.    E-Prescribing Status: Receipt confirmed by pharmacy (10/14/2015 8:58 AM EDT)     Associated Diagnoses    Essential hypertension, benign - Primary       Pharmacy    [id number] - [place], [place] - [address] AT [address]

## 2015-12-22 ENCOUNTER — Telehealth: Payer: Self-pay | Admitting: Endocrinology

## 2015-12-22 ENCOUNTER — Other Ambulatory Visit: Payer: Self-pay | Admitting: *Deleted

## 2015-12-22 DIAGNOSIS — I10 Essential (primary) hypertension: Secondary | ICD-10-CM

## 2015-12-22 MED ORDER — LEVOTHYROXINE SODIUM 150 MCG PO TABS: ORAL_TABLET | ORAL | Status: DC

## 2015-12-22 MED ORDER — LOSARTAN POTASSIUM 50 MG PO TABS: 50.0000 mg | ORAL_TABLET | Freq: Every day | ORAL | Status: DC

## 2015-12-22 NOTE — Telephone Encounter (Signed)
called for losartan refill, gave a 30/0 and informed pt to make appt as they are due for a jan OV w/Hochrein, agreeable to plan

## 2015-12-22 NOTE — Telephone Encounter (Signed)
rx sent for a 30 day supply, patient has an appointment on 01/05/16, no refills given.

## 2015-12-22 NOTE — Telephone Encounter (Signed)
Patient need refill of Levothyroxine send to  [facility] [id number] - [place], [place] - [address] AT [address] [phone] (Phone) [phone] (Fax)

## 2015-12-28 ENCOUNTER — Ambulatory Visit (HOSPITAL_BASED_OUTPATIENT_CLINIC_OR_DEPARTMENT_OTHER): Payer: Medicare Other

## 2015-12-28 ENCOUNTER — Other Ambulatory Visit (HOSPITAL_BASED_OUTPATIENT_CLINIC_OR_DEPARTMENT_OTHER): Payer: Medicare Other

## 2015-12-28 ENCOUNTER — Telehealth: Payer: Self-pay | Admitting: *Deleted

## 2015-12-28 VITALS — BP 145/48 | HR 60 | Temp 97.6°F

## 2015-12-28 DIAGNOSIS — I82439 Acute embolism and thrombosis of unspecified popliteal vein: Secondary | ICD-10-CM | POA: Diagnosis not present

## 2015-12-28 DIAGNOSIS — D469 Myelodysplastic syndrome, unspecified: Secondary | ICD-10-CM

## 2015-12-28 DIAGNOSIS — D630 Anemia in neoplastic disease: Secondary | ICD-10-CM

## 2015-12-28 LAB — CBC & DIFF AND RETIC
BASO%: 0.4 % (ref 0.0–2.0)
Basophils Absolute: 0 10*3/uL (ref 0.0–0.1)
EOS ABS: 1 10*3/uL — AB (ref 0.0–0.5)
EOS%: 9.7 % — ABNORMAL HIGH (ref 0.0–7.0)
HEMATOCRIT: 31.3 % — AB (ref 38.4–49.9)
HGB: 9.4 g/dL — ABNORMAL LOW (ref 13.0–17.1)
IMMATURE RETIC FRACT: 7.3 % (ref 3.00–10.60)
LYMPH%: 9.1 % — AB (ref 14.0–49.0)
MCH: 28.3 pg (ref 27.2–33.4)
MCHC: 30 g/dL — AB (ref 32.0–36.0)
MCV: 94.3 fL (ref 79.3–98.0)
MONO#: 1.2 10*3/uL — AB (ref 0.1–0.9)
MONO%: 11.6 % (ref 0.0–14.0)
NEUT#: 7.2 10*3/uL — ABNORMAL HIGH (ref 1.5–6.5)
NEUT%: 69.2 % (ref 39.0–75.0)
PLATELETS: 126 10*3/uL — AB (ref 140–400)
RBC: 3.32 10*6/uL — AB (ref 4.20–5.82)
RDW: 19 % — AB (ref 11.0–14.6)
RETIC %: 0.8 % (ref 0.80–1.80)
RETIC CT ABS: 26.56 10*3/uL — AB (ref 34.80–93.90)
WBC: 10.4 10*3/uL — AB (ref 4.0–10.3)
lymph#: 1 10*3/uL (ref 0.9–3.3)
nRBC: 0 % (ref 0–0)

## 2015-12-28 LAB — PROTIME-INR
INR: 3.5 (ref 2.00–3.50)
Protime: 42 Seconds — ABNORMAL HIGH (ref 10.6–13.4)

## 2015-12-28 LAB — TECHNOLOGIST REVIEW

## 2015-12-28 MED ORDER — DARBEPOETIN ALFA 500 MCG/ML IJ SOSY
500.0000 ug | PREFILLED_SYRINGE | Freq: Once | INTRAMUSCULAR | Status: AC
  Administered 2015-12-28: 500 ug via SUBCUTANEOUS
  Filled 2015-12-28: qty 1

## 2015-12-28 NOTE — Telephone Encounter (Signed)
Wife notified of message below. Verbalized understanding 

## 2015-12-28 NOTE — Telephone Encounter (Signed)
-----   Message from Heath Lark, MD sent at 12/28/2015  1:55 PM EST ----- Regarding: INR Hold warfarin for 2 days then resume 2.5 mg daily, recheck next visit ----- Message -----    From: [facility] Interface    Sent: 12/28/2015   1:53 PM      To: Heath Lark, MD

## 2015-12-29 ENCOUNTER — Telehealth: Payer: Self-pay | Admitting: Cardiology

## 2015-12-29 ENCOUNTER — Encounter: Payer: Medicare Other | Admitting: *Deleted

## 2015-12-29 NOTE — Telephone Encounter (Signed)
Confirmed remote transmission w/ pt wife.   

## 2015-12-30 ENCOUNTER — Encounter: Payer: Self-pay | Admitting: Cardiology

## 2016-01-03 ENCOUNTER — Other Ambulatory Visit (INDEPENDENT_AMBULATORY_CARE_PROVIDER_SITE_OTHER): Payer: Medicare Other

## 2016-01-03 DIAGNOSIS — E119 Type 2 diabetes mellitus without complications: Secondary | ICD-10-CM

## 2016-01-03 DIAGNOSIS — E038 Other specified hypothyroidism: Secondary | ICD-10-CM | POA: Diagnosis not present

## 2016-01-03 LAB — BASIC METABOLIC PANEL
BUN: 77 mg/dL — ABNORMAL HIGH (ref 6–23)
CO2: 24 mEq/L (ref 19–32)
Calcium: 8.9 mg/dL (ref 8.4–10.5)
Chloride: 105 mEq/L (ref 96–112)
Creatinine, Ser: 2.01 mg/dL — ABNORMAL HIGH (ref 0.40–1.50)
GFR: 33.59 mL/min — AB (ref >60.00)
Glucose, Bld: 143 mg/dL — ABNORMAL HIGH (ref 70–99)
POTASSIUM: 4.3 meq/L (ref 3.5–5.1)
SODIUM: 138 meq/L (ref 135–145)

## 2016-01-03 LAB — HEMOGLOBIN A1C: HEMOGLOBIN A1C: 6 % (ref 4.6–6.5)

## 2016-01-03 LAB — TSH: TSH: 1.53 u[IU]/mL (ref 0.35–4.50)

## 2016-01-05 ENCOUNTER — Encounter: Payer: Self-pay | Admitting: Endocrinology

## 2016-01-05 ENCOUNTER — Ambulatory Visit (INDEPENDENT_AMBULATORY_CARE_PROVIDER_SITE_OTHER): Payer: Medicare Other | Admitting: Endocrinology

## 2016-01-05 ENCOUNTER — Other Ambulatory Visit: Payer: Self-pay | Admitting: *Deleted

## 2016-01-05 VITALS — BP 102/60 | HR 65 | Temp 98.2°F | Resp 14 | Ht 64.0 in | Wt 147.2 lb

## 2016-01-05 DIAGNOSIS — E119 Type 2 diabetes mellitus without complications: Secondary | ICD-10-CM

## 2016-01-05 DIAGNOSIS — E038 Other specified hypothyroidism: Secondary | ICD-10-CM

## 2016-01-05 MED ORDER — LEVOTHYROXINE SODIUM 150 MCG PO TABS: ORAL_TABLET | ORAL | Status: AC

## 2016-01-05 NOTE — Progress Notes (Signed)
Patient ID: Douglas George, male   DOB: 11-26-1927, 82 y.o.   MRN: [id number]   Reason for Appointment:  follow-up   History of Present Illness   Diagnosis: Type 2 DIABETES MELITUS, date of diagnosis:  2003    Previous history: He has had mild diabetes which is consistently well controlled. Previously had been on Actos which was stopped because of potentially worsening his edema. He has been on low-dose glipizide ER subsequently  Recent history:  Blood sugars have been overall well controlled as judged by his consistently normal A1c ; this is despite stopping glipizide on his last visit  Has not been seen in follow-up for several months now He is checking readings sporadically Occasionally may have high readings if he is eating more carbohydrates or sweets No hypoglycemia reported, he does note the symptoms to recognize low sugars     Oral hypoglycemic drugs:  none  Side effects from medications: ? Edema from Actos       Monitors blood glucose:  rarely   Glucometer: Accucheck          Blood Glucose readings from meter review:  Recent range 116-176 , higher reading   Midday and late evening , average 151   Meals: 3 meals per day.  usually trying to watch fat intake and carbohydrates.  Does drink  Protein shake         Physical activity:  minimal, unable to do much           Complications: are: None    Wt Readings from Last 3 Encounters:  01/05/16 147 lb 3.2 oz (66.769 kg)  11/28/15 145 lb 9.6 oz (66.044 kg)  09/15/15 145 lb 9.6 oz (66.044 kg)   Lab Results  Component Value Date   HGBA1C 6.0 01/03/2016   HGBA1C 6.0 04/04/2015   HGBA1C 6.0 10/20/2014   Lab Results  Component Value Date   LDLCALC 67 10/20/2014   CREATININE 2.01* 01/03/2016         Medication List       This list is accurate as of: 01/05/16  9:24 PM.  Always use your most recent med list.               ACCU-CHEK AVIVA PLUS test strip  Generic drug:  glucose blood  USE ONCE DAILY AS  DIRECTED     acetaminophen 500 MG tablet  Commonly known as:  TYLENOL  Take 1,000 mg by mouth every 8 (eight) hours as needed for moderate pain or headache.     CALCIUM 1200 PO  Take 1,200 mg by mouth daily. Reported on 01/05/2016     clotrimazole-betamethasone cream  Commonly known as:  LOTRISONE  Apply 1 application topically daily.     docusate sodium 100 MG capsule  Commonly known as:  COLACE  Take 100 mg by mouth 2 (two) times daily.     doxazosin 1 MG tablet  Commonly known as:  CARDURA  Take 1 mg by mouth daily.     FLAX SEED OIL PO  Take 1 tablet by mouth daily.     fluorouracil 5 % cream  Commonly known as:  EFUDEX  Apply 1 application topically daily as needed (rash).     furosemide 80 MG tablet  Commonly known as:  LASIX  TAKE 1 TABLET BY MOUTH EVERY DAY     levothyroxine 150 MCG tablet  Commonly known as:  SYNTHROID, LEVOTHROID  TAKE 1 TABLET BY MOUTH EVERY MORNING ON  AN EMPTY STOMACH     losartan 50 MG tablet  Commonly known as:  COZAAR  Take 1 tablet (50 mg total) by mouth daily.     OSTEO BI-FLEX JOINT SHIELD PO  Take 1 tablet by mouth 2 (two) times daily.     oxybutynin 5 MG tablet  Commonly known as:  DITROPAN  Take 5 mg by mouth every 6 (six) hours as needed for bladder spasms.     polyethylene glycol packet  Commonly known as:  MIRALAX / GLYCOLAX  Take 17 g by mouth daily as needed for mild constipation.     tobramycin 0.3 % ophthalmic solution  Commonly known as:  TOBREX  Place 1 drop into both eyes 2 (two) times daily as needed (stinging).     vitamin B-12 1000 MCG tablet  Commonly known as:  CYANOCOBALAMIN  Take 1,000 mcg by mouth daily.     vitamin B-6 250 MG tablet  Take 250 mg by mouth daily.     vitamin C 500 MG tablet  Commonly known as:  ASCORBIC ACID  Take 500 mg by mouth at bedtime.     Vitamin D 1000 units capsule  Take 1,000 Units by mouth daily.     warfarin 2.5 MG tablet  Commonly known as:  COUMADIN  TAKE AS  DIRECTED BY ANTICOAGULATION CLINIC        Allergies:  Allergies  Allergen Reactions  . Keflex [Cephalexin] Other (See Comments)    Paranoid ..  . Codeine Nausea Only  . Ramipril Cough  . Vancomycin Nausea Only    Past Medical History  Diagnosis Date  . Hearing loss   . Skin change   . Chronic atrial fibrillation ([place])   . Diabetes mellitus type II, controlled ([place])   . Hyperlipidemia   . Depression   . Ischemic heart disease     remote stenting of the RCA in 1998 with residual LAD disease of 60 to 70% with negative nuclear in 2011  . Pulmonary hypertension ([place])     per echo in 2011  . Diverticulitis   . H/O blood clots 1990's    in L leg (related to being in a cast for surgery)  . Thrombocytopenia ([place])   . Anal fissure   . Neck mass 10/19/2013  . MDS (myelodysplastic syndrome) ([place]) 12/11/2013  . CHF (congestive heart failure) ([place])   . Myocardial infarction ([place]) 1999  . DVT (deep venous thrombosis) ([place]) ~ 2009    "LLE"  . Pneumonia ~ 11/2013    "tx'd; not really sure if he had it or not"  . OSA (obstructive sleep apnea)     BiPAP  . Hypothyroidism   . Anemia in chronic kidney disease(285.21) 11/13/2013  . GERD (gastroesophageal reflux disease)   . Arthritis     "joints" (04/21/2014)  . Spinal stenosis, lumbar   . Gout     "little bit in the left foot"  . Complete heart block [facility])     s/p single chamber PPM implantation April 2015 ([place])    Past Surgical History  Procedure Laterality Date  . Thyroidectomy  1960  . Wrist surgery Left ~ 2003    "cut the radial artery"  . Achilles tendon repair Left ~ 2002  . Shrapnel Left     "left shoulder; left hip" [place]   . Inguinal hernia repair Right 1980  . Cataract extraction w/ intraocular lens  implant, bilateral Bilateral ~ 2007  . Vasectomy    .  Colonoscopy  2009    polyps in the past  . Insert / replace / remove pacemaker  04/21/2014    single chamber PPM ([place])  . Coronary  angioplasty with stent placement  1999  . Tonsillectomy and adenoidectomy  1930's  . Pocket evacuation  05-11-2014    pocket hematoma evacuation by Dr Lovena Le  . Permanent pacemaker insertion N/A 04/21/2014    Procedure: PERMANENT PACEMAKER INSERTION;  Surgeon: Evans Lance, MD;  Location: [facility] CATH LAB;  Service: Cardiovascular;  Laterality: N/A;  . Pocket revision N/A 05/11/2014    Procedure: POCKET REVISION/HEMATOMA REMOVAL;  Surgeon: Evans Lance, MD;  Location: [facility] CATH LAB;  Service: Cardiovascular;  Laterality: N/A;    Family History  Problem Relation Age of Onset  . Heart failure Mother 62  . Diabetes Mother 10  . Heart attack Father 22  . Stroke Father 24  . Aortic aneurysm Father 31    ABDOMINAL  . Cancer Son     ?  Marland Kitchen Hepatitis Son   . Diabetes Brother     Social History:  reports that he quit smoking about 64 years ago. His smoking use included Cigarettes. He has a 2 pack-year smoking history. He has never used smokeless tobacco. He reports that he drinks about 4.2 oz of alcohol per week. He reports that he does not use illicit drugs.  Review of Systems:  Hypothyroidism:  he has had long-standing hypothyroidism, no complaints of fatigue TSH levels have been very stable with 150 mcg of levothyroxine which he takes in the mornings daily  Lab Results  Component Value Date   FREET4 1.04 04/04/2015   FREET4 1.16 06/18/2014   FREET4 1.15 09/16/2013   TSH 1.53 01/03/2016   TSH 3.38 04/04/2015   TSH 1.74 10/20/2014    Chronic leg edema:  Edema is likely from venous insufficiency primarily.  He is consistent with wearing his elastic stockings Also has been taking chronic diuretics, still taking Lasix 80 mg daily.   WEIGHT loss: This is  Leveled off   HYPERTENSION: He has had long-standing hypertension.  His blood pressure is somewhat difficult to auscultate and may be unequal in the 2 arms.  Currently on low-dose Cozaar from cardiologist, also taking ?  1 mg Cardura  at bedtime   He is not monitoring at home   he has had some persistent cough which is mostly dry  Lipids: These have been controlled with Crestor 5 mg , also takes fish oil  Lab Results  Component Value Date   CHOL 117 10/20/2014   HDL 47.70 10/20/2014   LDLCALC 67 10/20/2014   TRIG 11.0 10/20/2014   CHOLHDL 2 10/20/2014     He has had significant osteoarthritis of his hip limiting his activity    Myelodysplastic syndrome: He has had Aranesp and the dose has been recently increased  Lab Results  Component Value Date   WBC 10.4* 12/28/2015   HGB 9.4* 12/28/2015   HCT 31.3* 12/28/2015   MCV 94.3 12/28/2015   PLT 126* 12/28/2015      Examination:   BP 102/60 mmHg  Pulse 65  Temp(Src) 98.2 F (36.8 C)  Resp 14  Ht 5\' 4"  (1.626 m)  Wt 147 lb 3.2 oz (66.769 kg)  BMI 25.25 kg/m2  SpO2 94%  Body mass index is 25.25 kg/(m^2).      Repeat blood pressure on the right side 100/58   1-2 + ankle edema present  bilaterally; he is  wearing elastic stockings   lungs clear  ASSESSMENT/ PLAN:   1. Diabetes type 2   Blood glucose control appears fairly good with upper normal A1c  likely has improved control because of weight loss last year  Considering his age and multiple medical problems his blood sugars are excellent without any medications   He can reinstate this if blood sugars are consistently over 200  2. Hypothyroidism: Adequately replaced with 150 mcg dose  3. Chronic kidney disease, likely to be from nephrosclerosis.  His creatinine his higher likely from his low normal blood pressure   4. Long-standing venous edema of legs. This is well-controlled and he is also using elastic stockings.   5. Hypertension: His blood pressure is  Lower and he will stop doxazosin  follow-up with cardiologist next month     Jayce Boyko 01/05/2016, 9:24 PM

## 2016-01-05 NOTE — Patient Instructions (Signed)
Stop Doxazosin

## 2016-01-11 ENCOUNTER — Other Ambulatory Visit: Payer: Self-pay | Admitting: Internal Medicine

## 2016-01-11 NOTE — Telephone Encounter (Signed)
Coumadin refills i believe are usually handled by the coumadin clinic  Can this be referred to his coumadin clinic

## 2016-01-17 ENCOUNTER — Other Ambulatory Visit: Payer: Self-pay | Admitting: Endocrinology

## 2016-01-18 ENCOUNTER — Other Ambulatory Visit (HOSPITAL_BASED_OUTPATIENT_CLINIC_OR_DEPARTMENT_OTHER): Payer: Medicare Other

## 2016-01-18 ENCOUNTER — Ambulatory Visit (HOSPITAL_BASED_OUTPATIENT_CLINIC_OR_DEPARTMENT_OTHER): Payer: Medicare Other

## 2016-01-18 VITALS — BP 140/65 | HR 60 | Temp 98.4°F

## 2016-01-18 DIAGNOSIS — I82439 Acute embolism and thrombosis of unspecified popliteal vein: Secondary | ICD-10-CM | POA: Diagnosis not present

## 2016-01-18 DIAGNOSIS — D630 Anemia in neoplastic disease: Secondary | ICD-10-CM

## 2016-01-18 DIAGNOSIS — D469 Myelodysplastic syndrome, unspecified: Secondary | ICD-10-CM

## 2016-01-18 LAB — CBC & DIFF AND RETIC
BASO%: 1.2 % (ref 0.0–2.0)
Basophils Absolute: 0.1 10*3/uL (ref 0.0–0.1)
EOS%: 5.6 % (ref 0.0–7.0)
Eosinophils Absolute: 0.4 10*3/uL (ref 0.0–0.5)
HEMATOCRIT: 32 % — AB (ref 38.4–49.9)
HEMOGLOBIN: 9.7 g/dL — AB (ref 13.0–17.1)
Immature Retic Fract: 3 % (ref 3.00–10.60)
LYMPH#: 0.9 10*3/uL (ref 0.9–3.3)
LYMPH%: 13.7 % — AB (ref 14.0–49.0)
MCH: 28.4 pg (ref 27.2–33.4)
MCHC: 30.3 g/dL — ABNORMAL LOW (ref 32.0–36.0)
MCV: 93.8 fL (ref 79.3–98.0)
MONO#: 0.9 10*3/uL (ref 0.1–0.9)
MONO%: 13.3 % (ref 0.0–14.0)
NEUT#: 4.4 10*3/uL (ref 1.5–6.5)
NEUT%: 66.2 % (ref 39.0–75.0)
Platelets: 166 10*3/uL (ref 140–400)
RBC: 3.41 10*6/uL — ABNORMAL LOW (ref 4.20–5.82)
RDW: 18.8 % — ABNORMAL HIGH (ref 11.0–14.6)
RETIC %: 0.66 % — AB (ref 0.80–1.80)
RETIC CT ABS: 22.51 10*3/uL — AB (ref 34.80–93.90)
WBC: 6.6 10*3/uL (ref 4.0–10.3)
nRBC: 0 % (ref 0–0)

## 2016-01-18 LAB — PROTIME-INR
INR: 2.3 (ref 2.00–3.50)
Protime: 27.6 Seconds — ABNORMAL HIGH (ref 10.6–13.4)

## 2016-01-18 MED ORDER — DARBEPOETIN ALFA 500 MCG/ML IJ SOSY
500.0000 ug | PREFILLED_SYRINGE | Freq: Once | INTRAMUSCULAR | Status: AC
  Administered 2016-01-18: 500 ug via SUBCUTANEOUS
  Filled 2016-01-18: qty 1

## 2016-01-18 NOTE — Progress Notes (Unsigned)
INR level 2.3 today.  Continue same dose of Coumadin as per Dr Alvy Bimler.

## 2016-01-19 ENCOUNTER — Other Ambulatory Visit: Payer: Self-pay | Admitting: Cardiology

## 2016-01-19 ENCOUNTER — Other Ambulatory Visit: Payer: Self-pay | Admitting: Endocrinology

## 2016-01-19 ENCOUNTER — Encounter: Payer: Self-pay | Admitting: Cardiology

## 2016-01-19 NOTE — Telephone Encounter (Signed)
Rx request sent to pharmacy.  

## 2016-01-30 ENCOUNTER — Ambulatory Visit: Payer: Medicare Other | Admitting: Cardiology

## 2016-02-06 ENCOUNTER — Inpatient Hospital Stay (HOSPITAL_COMMUNITY)
Admission: EM | Admit: 2016-02-06 | Discharge: 2016-02-29 | DRG: 871 | Disposition: E | Payer: Medicare Other | Attending: Pulmonary Disease | Admitting: Pulmonary Disease

## 2016-02-06 ENCOUNTER — Inpatient Hospital Stay (HOSPITAL_COMMUNITY): Payer: Medicare Other

## 2016-02-06 ENCOUNTER — Emergency Department (HOSPITAL_COMMUNITY): Payer: Medicare Other

## 2016-02-06 ENCOUNTER — Encounter (HOSPITAL_COMMUNITY): Payer: Self-pay | Admitting: Emergency Medicine

## 2016-02-06 DIAGNOSIS — A419 Sepsis, unspecified organism: Secondary | ICD-10-CM | POA: Diagnosis present

## 2016-02-06 DIAGNOSIS — Z7901 Long term (current) use of anticoagulants: Secondary | ICD-10-CM

## 2016-02-06 DIAGNOSIS — D469 Myelodysplastic syndrome, unspecified: Secondary | ICD-10-CM | POA: Diagnosis present

## 2016-02-06 DIAGNOSIS — I509 Heart failure, unspecified: Secondary | ICD-10-CM | POA: Diagnosis present

## 2016-02-06 DIAGNOSIS — N189 Chronic kidney disease, unspecified: Secondary | ICD-10-CM | POA: Diagnosis present

## 2016-02-06 DIAGNOSIS — I252 Old myocardial infarction: Secondary | ICD-10-CM

## 2016-02-06 DIAGNOSIS — Z87891 Personal history of nicotine dependence: Secondary | ICD-10-CM

## 2016-02-06 DIAGNOSIS — I482 Chronic atrial fibrillation: Secondary | ICD-10-CM | POA: Diagnosis present

## 2016-02-06 DIAGNOSIS — Z66 Do not resuscitate: Secondary | ICD-10-CM | POA: Diagnosis not present

## 2016-02-06 DIAGNOSIS — J189 Pneumonia, unspecified organism: Secondary | ICD-10-CM | POA: Diagnosis not present

## 2016-02-06 DIAGNOSIS — I272 Other secondary pulmonary hypertension: Secondary | ICD-10-CM | POA: Diagnosis present

## 2016-02-06 DIAGNOSIS — J690 Pneumonitis due to inhalation of food and vomit: Secondary | ICD-10-CM | POA: Diagnosis present

## 2016-02-06 DIAGNOSIS — E1122 Type 2 diabetes mellitus with diabetic chronic kidney disease: Secondary | ICD-10-CM | POA: Diagnosis present

## 2016-02-06 DIAGNOSIS — E872 Acidosis: Secondary | ICD-10-CM | POA: Diagnosis present

## 2016-02-06 DIAGNOSIS — Z950 Presence of cardiac pacemaker: Secondary | ICD-10-CM | POA: Diagnosis not present

## 2016-02-06 DIAGNOSIS — Z515 Encounter for palliative care: Secondary | ICD-10-CM | POA: Diagnosis not present

## 2016-02-06 DIAGNOSIS — Z955 Presence of coronary angioplasty implant and graft: Secondary | ICD-10-CM

## 2016-02-06 DIAGNOSIS — J9601 Acute respiratory failure with hypoxia: Secondary | ICD-10-CM | POA: Diagnosis present

## 2016-02-06 DIAGNOSIS — Z79899 Other long term (current) drug therapy: Secondary | ICD-10-CM

## 2016-02-06 DIAGNOSIS — E039 Hypothyroidism, unspecified: Secondary | ICD-10-CM | POA: Diagnosis present

## 2016-02-06 DIAGNOSIS — R6521 Severe sepsis with septic shock: Secondary | ICD-10-CM

## 2016-02-06 DIAGNOSIS — R531 Weakness: Secondary | ICD-10-CM | POA: Diagnosis present

## 2016-02-06 DIAGNOSIS — N390 Urinary tract infection, site not specified: Secondary | ICD-10-CM | POA: Insufficient documentation

## 2016-02-06 DIAGNOSIS — I130 Hypertensive heart and chronic kidney disease with heart failure and stage 1 through stage 4 chronic kidney disease, or unspecified chronic kidney disease: Secondary | ICD-10-CM | POA: Diagnosis present

## 2016-02-06 DIAGNOSIS — G4733 Obstructive sleep apnea (adult) (pediatric): Secondary | ICD-10-CM | POA: Diagnosis present

## 2016-02-06 LAB — I-STAT CG4 LACTIC ACID, ED
Lactic Acid, Venous: 4.91 mmol/L (ref 0.5–2.0)
Lactic Acid, Venous: 3.55 mmol/L (ref 0.5–2.0)

## 2016-02-06 LAB — BASIC METABOLIC PANEL
Anion gap: 15 (ref 5–15)
BUN: 61 mg/dL — AB (ref 6–20)
CALCIUM: 7.9 mg/dL — AB (ref 8.9–10.3)
CO2: 16 mmol/L — ABNORMAL LOW (ref 22–32)
CREATININE: 2.64 mg/dL — AB (ref 0.61–1.24)
Chloride: 98 mmol/L — ABNORMAL LOW (ref 101–111)
GFR calc Af Amer: 24 mL/min — ABNORMAL LOW (ref >60)
GFR, EST NON AFRICAN AMERICAN: 20 mL/min — AB (ref >60)
Glucose, Bld: 121 mg/dL — ABNORMAL HIGH (ref 65–99)
POTASSIUM: 4 mmol/L (ref 3.5–5.1)
SODIUM: 129 mmol/L — AB (ref 135–145)

## 2016-02-06 LAB — GLUCOSE, CAPILLARY
GLUCOSE-CAPILLARY: 90 mg/dL (ref 65–99)
Glucose-Capillary: 98 mg/dL (ref 65–99)
Glucose-Capillary: 119 mg/dL — ABNORMAL HIGH (ref 65–99)
Glucose-Capillary: 103 mg/dL — ABNORMAL HIGH (ref 65–99)

## 2016-02-06 LAB — COMPREHENSIVE METABOLIC PANEL
ALBUMIN: 3.5 g/dL (ref 3.5–5.0)
ALK PHOS: 123 U/L (ref 38–126)
ALT: 18 U/L (ref 17–63)
ANION GAP: 18 — AB (ref 5–15)
AST: 32 U/L (ref 15–41)
BILIRUBIN TOTAL: 1.3 mg/dL — AB (ref 0.3–1.2)
BUN: 57 mg/dL — AB (ref 6–20)
CALCIUM: 9 mg/dL (ref 8.9–10.3)
CO2: 16 mmol/L — ABNORMAL LOW (ref 22–32)
Chloride: 104 mmol/L (ref 101–111)
Creatinine, Ser: 2.05 mg/dL — ABNORMAL HIGH (ref 0.61–1.24)
GFR calc Af Amer: 32 mL/min — ABNORMAL LOW (ref >60)
GFR, EST NON AFRICAN AMERICAN: 28 mL/min — AB (ref >60)
GLUCOSE: 125 mg/dL — AB (ref 65–99)
Potassium: 4.5 mmol/L (ref 3.5–5.1)
Sodium: 138 mmol/L (ref 135–145)
TOTAL PROTEIN: 7.4 g/dL (ref 6.5–8.1)

## 2016-02-06 LAB — URINALYSIS, ROUTINE W REFLEX MICROSCOPIC
BILIRUBIN URINE: NEGATIVE
GLUCOSE, UA: NEGATIVE mg/dL
Ketones, ur: NEGATIVE mg/dL
NITRITE: NEGATIVE
PH: 5.5 (ref 5.0–8.0)
PROTEIN: 30 mg/dL — AB
SPECIFIC GRAVITY, URINE: 1.014 (ref 1.005–1.030)

## 2016-02-06 LAB — CBC WITH DIFFERENTIAL/PLATELET
Basophils Absolute: 0 10*3/uL (ref 0.0–0.1)
Basophils Relative: 0 %
EOS ABS: 0.1 10*3/uL (ref 0.0–0.7)
EOS PCT: 1 %
HCT: 37.1 % — ABNORMAL LOW (ref 39.0–52.0)
Hemoglobin: 11.4 g/dL — ABNORMAL LOW (ref 13.0–17.0)
LYMPHS ABS: 1.3 10*3/uL (ref 0.7–4.0)
Lymphocytes Relative: 6 %
MCH: 28.6 pg (ref 26.0–34.0)
MCHC: 30.7 g/dL (ref 30.0–36.0)
MCV: 93 fL (ref 78.0–100.0)
MONO ABS: 0.8 10*3/uL (ref 0.1–1.0)
MONOS PCT: 4 %
Neutro Abs: 20.5 10*3/uL — ABNORMAL HIGH (ref 1.7–7.7)
Neutrophils Relative %: 89 %
PLATELETS: 225 10*3/uL (ref 150–400)
RBC: 3.99 MIL/uL — ABNORMAL LOW (ref 4.22–5.81)
RDW: 19 % — AB (ref 11.5–15.5)
WBC: 22.8 10*3/uL — AB (ref 4.0–10.5)

## 2016-02-06 LAB — MRSA PCR SCREENING: MRSA BY PCR: NEGATIVE

## 2016-02-06 LAB — CORTISOL: CORTISOL PLASMA: 18.1 ug/dL

## 2016-02-06 LAB — URINE MICROSCOPIC-ADD ON

## 2016-02-06 LAB — PROTIME-INR
INR: 4.65 — AB (ref 0.00–1.49)
PROTHROMBIN TIME: 42.6 s — AB (ref 11.6–15.2)

## 2016-02-06 LAB — MAGNESIUM: Magnesium: 1.6 mg/dL — ABNORMAL LOW (ref 1.7–2.4)

## 2016-02-06 LAB — LACTIC ACID, PLASMA: LACTIC ACID, VENOUS: 3.2 mmol/L — AB (ref 0.5–2.0)

## 2016-02-06 MED ORDER — ACETAMINOPHEN 325 MG PO TABS
650.0000 mg | ORAL_TABLET | Freq: Once | ORAL | Status: AC
  Administered 2016-02-06: 650 mg via ORAL
  Filled 2016-02-06: qty 2

## 2016-02-06 MED ORDER — SODIUM CHLORIDE 0.9 % IV SOLN
INTRAVENOUS | Status: DC
  Administered 2016-02-07: 75 mL/h via INTRAVENOUS

## 2016-02-06 MED ORDER — LEVOTHYROXINE SODIUM 150 MCG PO TABS
150.0000 ug | ORAL_TABLET | Freq: Every day | ORAL | Status: DC
  Filled 2016-02-06 (×2): qty 1

## 2016-02-06 MED ORDER — NOREPINEPHRINE BITARTRATE 1 MG/ML IV SOLN
0.0000 ug/min | INTRAVENOUS | Status: DC
  Filled 2016-02-06: qty 4

## 2016-02-06 MED ORDER — WARFARIN - PHARMACIST DOSING INPATIENT: Freq: Every day | Status: DC

## 2016-02-06 MED ORDER — VITAMIN B-12 1000 MCG PO TABS
1000.0000 ug | ORAL_TABLET | Freq: Every day | ORAL | Status: DC
  Filled 2016-02-06 (×2): qty 1

## 2016-02-06 MED ORDER — VANCOMYCIN HCL IN DEXTROSE 1-5 GM/200ML-% IV SOLN
1000.0000 mg | Freq: Once | INTRAVENOUS | Status: AC
  Administered 2016-02-06: 1000 mg via INTRAVENOUS
  Filled 2016-02-06: qty 200

## 2016-02-06 MED ORDER — HYDROCORTISONE NA SUCCINATE PF 100 MG IJ SOLR: 50.0000 mg | Freq: Four times a day (QID) | INTRAMUSCULAR | Status: DC

## 2016-02-06 MED ORDER — NOREPINEPHRINE BITARTRATE 1 MG/ML IV SOLN
0.0000 ug/min | INTRAVENOUS | Status: DC
  Administered 2016-02-06: 30 ug/min via INTRAVENOUS
  Filled 2016-02-06 (×3): qty 16

## 2016-02-06 MED ORDER — INSULIN ASPART 100 UNIT/ML ~~LOC~~ SOLN: 0.0000 [IU] | SUBCUTANEOUS | Status: DC

## 2016-02-06 MED ORDER — CETYLPYRIDINIUM CHLORIDE 0.05 % MT LIQD
7.0000 mL | Freq: Two times a day (BID) | OROMUCOSAL | Status: DC
  Administered 2016-02-06 (×2): 7 mL via OROMUCOSAL

## 2016-02-06 MED ORDER — DEXTROSE 5 % IV SOLN
2.0000 g | INTRAVENOUS | Status: DC
  Filled 2016-02-06: qty 2

## 2016-02-06 MED ORDER — POLYETHYLENE GLYCOL 3350 17 G PO PACK
17.0000 g | PACK | Freq: Every day | ORAL | Status: DC | PRN
  Filled 2016-02-06: qty 1

## 2016-02-06 MED ORDER — VANCOMYCIN HCL 500 MG IV SOLR: 500.0000 mg | INTRAVENOUS | Status: DC

## 2016-02-06 MED ORDER — NOREPINEPHRINE BITARTRATE 1 MG/ML IV SOLN
0.0000 ug/min | Freq: Once | INTRAVENOUS | Status: AC
  Administered 2016-02-06: 2 ug/min via INTRAVENOUS
  Filled 2016-02-06: qty 4

## 2016-02-06 MED ORDER — WHITE PETROLATUM GEL
Status: AC
  Administered 2016-02-06: 0.2
  Filled 2016-02-06: qty 1

## 2016-02-06 MED ORDER — SODIUM CHLORIDE 0.9 % IV SOLN
Freq: Once | INTRAVENOUS | Status: AC
  Administered 2016-02-06: 08:00:00 via INTRAVENOUS

## 2016-02-06 MED ORDER — SODIUM CHLORIDE 0.9 % IV BOLUS (SEPSIS)
1000.0000 mL | Freq: Once | INTRAVENOUS | Status: AC
  Administered 2016-02-06: 1000 mL via INTRAVENOUS

## 2016-02-06 MED ORDER — SODIUM CHLORIDE 0.9 % IV SOLN
0.0300 [IU]/min | INTRAVENOUS | Status: DC
  Administered 2016-02-06 – 2016-02-07 (×2): 0.03 [IU]/min via INTRAVENOUS
  Filled 2016-02-06 (×2): qty 2

## 2016-02-06 MED ORDER — DEXTROSE 5 % IV SOLN: 1.0000 g | INTRAVENOUS | Status: DC

## 2016-02-06 MED ORDER — LORAZEPAM 2 MG/ML IJ SOLN
1.0000 mg | INTRAMUSCULAR | Status: DC | PRN
  Administered 2016-02-06: 1 mg via INTRAVENOUS
  Filled 2016-02-06: qty 1

## 2016-02-06 MED ORDER — SODIUM CHLORIDE 0.9 % IV SOLN: 250.0000 mL | INTRAVENOUS | Status: DC | PRN

## 2016-02-06 MED ORDER — SODIUM CHLORIDE 0.9% FLUSH: 3.0000 mL | INTRAVENOUS | Status: DC | PRN

## 2016-02-06 MED ORDER — VANCOMYCIN HCL 500 MG IV SOLR
500.0000 mg | INTRAVENOUS | Status: DC
  Administered 2016-02-07: 500 mg via INTRAVENOUS
  Filled 2016-02-06: qty 500

## 2016-02-06 MED ORDER — DOCUSATE SODIUM 100 MG PO CAPS
100.0000 mg | ORAL_CAPSULE | Freq: Two times a day (BID) | ORAL | Status: DC
  Filled 2016-02-06 (×4): qty 1

## 2016-02-06 MED ORDER — LORAZEPAM 1 MG PO TABS: 1.0000 mg | ORAL_TABLET | Freq: Four times a day (QID) | ORAL | Status: DC | PRN

## 2016-02-06 MED ORDER — ONDANSETRON HCL 4 MG/2ML IJ SOLN: 4.0000 mg | Freq: Four times a day (QID) | INTRAMUSCULAR | Status: DC | PRN

## 2016-02-06 MED ORDER — PIPERACILLIN-TAZOBACTAM 3.375 G IVPB 30 MIN
3.3750 g | Freq: Once | INTRAVENOUS | Status: AC
  Administered 2016-02-06: 3.375 g via INTRAVENOUS
  Filled 2016-02-06: qty 50

## 2016-02-06 MED ORDER — OXYBUTYNIN CHLORIDE 5 MG PO TABS
5.0000 mg | ORAL_TABLET | Freq: Two times a day (BID) | ORAL | Status: DC
  Filled 2016-02-06 (×4): qty 1

## 2016-02-06 MED ORDER — TOBRAMYCIN 0.3 % OP SOLN
1.0000 [drp] | Freq: Two times a day (BID) | OPHTHALMIC | Status: DC | PRN
  Administered 2016-02-06: 1 [drp] via OPHTHALMIC
  Filled 2016-02-06 (×2): qty 5

## 2016-02-06 MED ORDER — ACETAMINOPHEN 325 MG PO TABS: 650.0000 mg | ORAL_TABLET | Freq: Four times a day (QID) | ORAL | Status: DC | PRN

## 2016-02-06 MED ORDER — DEXTROSE 5 % IV SOLN
2.0000 g | Freq: Once | INTRAVENOUS | Status: DC
  Filled 2016-02-06: qty 2

## 2016-02-06 MED ORDER — PIPERACILLIN-TAZOBACTAM 3.375 G IVPB
3.3750 g | Freq: Three times a day (TID) | INTRAVENOUS | Status: DC
  Filled 2016-02-06 (×2): qty 50

## 2016-02-06 MED ORDER — SODIUM CHLORIDE 0.9% FLUSH
3.0000 mL | Freq: Two times a day (BID) | INTRAVENOUS | Status: DC
  Administered 2016-02-06 – 2016-02-07 (×3): 3 mL via INTRAVENOUS

## 2016-02-06 MED ORDER — VITAMIN D3 25 MCG (1000 UNIT) PO TABS
1000.0000 [IU] | ORAL_TABLET | Freq: Every day | ORAL | Status: DC
  Filled 2016-02-06: qty 1

## 2016-02-06 MED ORDER — DEXTROSE 5 % IV SOLN
500.0000 mg | INTRAVENOUS | Status: DC
  Administered 2016-02-06 – 2016-02-07 (×2): 500 mg via INTRAVENOUS
  Filled 2016-02-06 (×2): qty 500

## 2016-02-06 NOTE — ED Notes (Signed)
Pt arrives via EMS with c/o sudden onset fever tonight, per family 101.0 F - 1 G tylenol given PTA about 0330. LS clear, urine suspect for UTI.

## 2016-02-06 NOTE — Care Management Note (Signed)
Case Management Note  Patient Details  Name: Douglas George MRN: [id number] Date of Birth: 05-Feb-1927  Subjective/Objective:     Pt admitted with  sepsis possibly secondary to UTI           Action/Plan:  Pt is from home with wife.  Pt's wife stated she has been taking care of pt for approximately 5 years; pt is able to ambulate with assistance from walker short distances at home, pt is not home bound however requires assistance for almost all task.  Pt is not currently receiving HH services.   Pt and wife are planning on pt discharging back home and are interested in 3:1 at discharge in addition to PT recommendations.  CM will continue to monitor for disposition needs.   Expected Discharge Date:                  Expected Discharge Plan:  [place]  In-House Referral:     Discharge planning Services  CM Consult  Post Acute Care Choice:    Choice offered to:     DME Arranged:    DME Agency:     HH Arranged:    [place] Agency:     Status of Service:  In process, will continue to follow  Medicare Important Message Given:    Date Medicare IM Given:    Medicare IM give by:    Date Additional Medicare IM Given:    Additional Medicare Important Message give by:     If discussed at [place] of Stay Meetings, dates discussed:    Additional Comments:  Maryclare Labrador, RN 02/22/2016, 1:29 PM

## 2016-02-06 NOTE — ED Provider Notes (Addendum)
CSN: [id number]     Arrival date & time 02/04/2016  0407 History   First MD Initiated Contact with Patient 02/12/2016 0422     Chief Complaint  Patient presents with  . Fever     (Consider location/radiation/quality/duration/timing/severity/associated sxs/prior Treatment) HPI  This is an 82 yo male with a history of atrial fibrillation, diabetes, hypertension, ischemic heart disease, pulmonary hypertension, CHF who presents with fever. Patient wife at the bedside. States that he woke up and agitated. She did his temperature to be 101 by mouth. She also over the last 2-3 days is noted increased cloudiness to his urine. Over last 24 hours she has also developed a nonproductive cough.  No nausea, vomiting, abdominal pain, diarrhea.  Patient is oriented 2. Per the patient's family, he is full code. Discussed with them that given need for fluid resuscitation, his respiratory status may deteriorate and he may require intubation.   Past Medical History  Diagnosis Date  . Hearing loss   . Skin change   . Chronic atrial fibrillation ([place])   . Diabetes mellitus type II, controlled ([place])   . Hyperlipidemia   . Depression   . Ischemic heart disease     remote stenting of the RCA in 1998 with residual LAD disease of 60 to 70% with negative nuclear in 2011  . Pulmonary hypertension ([place])     per echo in 2011  . Diverticulitis   . H/O blood clots 1990's    in L leg (related to being in a cast for surgery)  . Thrombocytopenia ([place])   . Anal fissure   . Neck mass 10/19/2013  . MDS (myelodysplastic syndrome) ([place]) 12/11/2013  . CHF (congestive heart failure) ([place])   . Myocardial infarction ([place]) 1999  . DVT (deep venous thrombosis) ([place]) ~ 2009    "LLE"  . Pneumonia ~ 11/2013    "tx'd; not really sure if he had it or not"  . OSA (obstructive sleep apnea)     BiPAP  . Hypothyroidism   . Anemia in chronic kidney disease(285.21) 11/13/2013  . GERD (gastroesophageal reflux disease)   . Arthritis      "joints" (04/21/2014)  . Spinal stenosis, lumbar   . Gout     "little bit in the left foot"  . Complete heart block [facility])     s/p single chamber PPM implantation April 2015 ([place])   Past Surgical History  Procedure Laterality Date  . Thyroidectomy  1960  . Wrist surgery Left ~ 2003    "cut the radial artery"  . Achilles tendon repair Left ~ 2002  . Shrapnel Left     "left shoulder; left hip" [place]   . Inguinal hernia repair Right 1980  . Cataract extraction w/ intraocular lens  implant, bilateral Bilateral ~ 2007  . Vasectomy    . Colonoscopy  2009    polyps in the past  . Insert / replace / remove pacemaker  04/21/2014    single chamber PPM ([place])  . Coronary angioplasty with stent placement  1999  . Tonsillectomy and adenoidectomy  1930's  . Pocket evacuation  05-11-2014    pocket hematoma evacuation by Dr Lovena Le  . Permanent pacemaker insertion N/A 04/21/2014    Procedure: PERMANENT PACEMAKER INSERTION;  Surgeon: Evans Lance, MD;  Location: [facility] CATH LAB;  Service: Cardiovascular;  Laterality: N/A;  . Pocket revision N/A 05/11/2014    Procedure: POCKET REVISION/HEMATOMA REMOVAL;  Surgeon: Evans Lance, MD;  Location: [facility]  CATH LAB;  Service: Cardiovascular;  Laterality: N/A;   Family History  Problem Relation Age of Onset  . Heart failure Mother 19  . Diabetes Mother 60  . Heart attack Father 107  . Stroke Father 14  . Aortic aneurysm Father 104    ABDOMINAL  . Cancer Son     ?  Marland Kitchen Hepatitis Son   . Diabetes Brother    Social History  Substance Use Topics  . Smoking status: Former Smoker -- 2.00 packs/day for 1 years    Types: Cigarettes    Quit date: 01/01/1952  . Smokeless tobacco: Never Used  . Alcohol Use: 4.2 oz/week    7 Glasses of wine per week    Review of Systems  Constitutional: Positive for fever.  Respiratory: Positive for cough. Negative for chest tightness and shortness of breath.   Cardiovascular: Negative.  Negative for  chest pain.  Gastrointestinal: Negative.  Negative for nausea, vomiting and abdominal pain.  Genitourinary: Positive for dysuria.  Skin: Negative for rash.  Neurological: Negative for headaches.  Psychiatric/Behavioral: Positive for behavioral problems, confusion and agitation.  All other systems reviewed and are negative.     Allergies  Keflex; Codeine; Ramipril; and Vancomycin  Home Medications   Prior to Admission medications   Medication Sig Start Date End Date Taking? Authorizing Provider  ACCU-CHEK AVIVA PLUS test strip USE ONCE DAILY AS DIRECTED 01/06/15  Yes Elayne Snare, MD  acetaminophen (TYLENOL) 500 MG tablet Take 1,000 mg by mouth every 8 (eight) hours as needed for moderate pain or headache.    Yes Historical Provider, MD  Calcium Carbonate-Vit D-Min (CALCIUM 1200 PO) Take 1,200 mg by mouth daily. Reported on 01/05/2016   Yes Historical Provider, MD  Cholecalciferol (VITAMIN D) 1000 UNITS capsule Take 1,000 Units by mouth daily.    Yes Historical Provider, MD  clotrimazole-betamethasone (LOTRISONE) cream Apply 1 application topically daily. 08/13/14  Yes Historical Provider, MD  docusate sodium (COLACE) 100 MG capsule Take 100 mg by mouth 2 (two) times daily.   Yes Historical Provider, MD  Flaxseed, Linseed, (FLAX SEED OIL PO) Take 1 tablet by mouth daily.    Yes Historical Provider, MD  fluorouracil (EFUDEX) 5 % cream Apply 1 application topically daily as needed (rash).  03/14/15  Yes Historical Provider, MD  furosemide (LASIX) 80 MG tablet TAKE 1 TABLET BY MOUTH EVERY DAY Patient taking differently: TAKE 0.5 TABLET BY MOUTH EVERY DAY 09/06/15  Yes Elayne Snare, MD  levothyroxine (SYNTHROID, LEVOTHROID) 150 MCG tablet TAKE 1 TABLET BY MOUTH EVERY MORNING ON AN EMPTY STOMACH 01/05/16  Yes Elayne Snare, MD  losartan (COZAAR) 50 MG tablet TAKE 1 TABLET(50 MG) BY MOUTH DAILY 01/19/16  Yes Minus Breeding, MD  Misc Natural Products (OSTEO BI-FLEX JOINT SHIELD PO) Take 1 tablet by mouth 2 (two)  times daily.    Yes Historical Provider, MD  oxybutynin (DITROPAN) 5 MG tablet Take 5 mg by mouth 2 (two) times daily.    Yes Historical Provider, MD  polyethylene glycol (MIRALAX / GLYCOLAX) packet Take 17 g by mouth daily as needed for mild constipation.    Yes Historical Provider, MD  Pyridoxine HCl (VITAMIN B-6) 250 MG tablet Take 250 mg by mouth daily.   Yes Historical Provider, MD  tobramycin (TOBREX) 0.3 % ophthalmic solution Place 1 drop into both eyes 2 (two) times daily as needed (stinging).    Yes Historical Provider, MD  vitamin B-12 (CYANOCOBALAMIN) 1000 MCG tablet Take 1,000 mcg by mouth daily.  Yes Historical Provider, MD  vitamin C (ASCORBIC ACID) 500 MG tablet Take 500 mg by mouth at bedtime.    Yes Historical Provider, MD  warfarin (COUMADIN) 2.5 MG tablet TAKE AS DIRECTED BY ANTICOAGULATION CLINIC Patient taking differently: Take one tablet every evening. 12/19/15  Yes Biagio Borg, MD   BP 90/76 mmHg  Pulse 66  Temp(Src) 101.7 F (38.7 C) (Oral)  Resp 21  Ht 5\' 3"  (1.6 m)  Wt 145 lb (65.772 kg)  BMI 25.69 kg/m2  SpO2 98% Physical Exam  Constitutional:  Elderly, frail-appearing  HENT:  Head: Normocephalic and atraumatic.  Mucous membranes dry, dry blood noted about the lips  Eyes: Pupils are equal, round, and reactive to light.  Cardiovascular: Normal rate, regular rhythm and normal heart sounds.   No murmur heard. Pulmonary/Chest: Effort normal. No respiratory distress. He has wheezes.  Expiratory wheezing diffusely, coarse breath sounds bilaterally  Abdominal: Soft. Bowel sounds are normal. There is no tenderness. There is no rebound.  Musculoskeletal: He exhibits edema.  Neurological: He is alert.  Oriented to person and place, not time  Skin: Skin is warm and dry.  Multiple skin lesions noted  Psychiatric: He has a normal mood and affect.  Nursing note and vitals reviewed.   ED Course  Procedures (including critical care time)  CRITICAL  CARE Performed by: Merryl Hacker   Total critical care time: 60 minutes  Critical care time was exclusive of separately billable procedures and treating other patients.  Critical care was necessary to treat or prevent imminent or life-threatening deterioration.  Critical care was time spent personally by me on the following activities: development of treatment plan with patient and/or surrogate as well as nursing, discussions with consultants, evaluation of patient's response to treatment, examination of patient, obtaining history from patient or surrogate, ordering and performing treatments and interventions, ordering and review of laboratory studies, ordering and review of radiographic studies, pulse oximetry and re-evaluation of patient's condition.  Labs Review Labs Reviewed  COMPREHENSIVE METABOLIC PANEL - Abnormal; Notable for the following:    CO2 16 (*)    Glucose, Bld 125 (*)    BUN 57 (*)    Creatinine, Ser 2.05 (*)    Total Bilirubin 1.3 (*)    GFR calc non Af Amer 28 (*)    GFR calc Af Amer 32 (*)    Anion gap 18 (*)    All other components within normal limits  CBC WITH DIFFERENTIAL/PLATELET - Abnormal; Notable for the following:    WBC 22.8 (*)    RBC 3.99 (*)    Hemoglobin 11.4 (*)    HCT 37.1 (*)    RDW 19.0 (*)    Neutro Abs 20.5 (*)    All other components within normal limits  URINALYSIS, ROUTINE W REFLEX MICROSCOPIC (NOT AT [facility]) - Abnormal; Notable for the following:    APPearance CLOUDY (*)    Hgb urine dipstick MODERATE (*)    Protein, ur 30 (*)    Leukocytes, UA LARGE (*)    All other components within normal limits  URINE MICROSCOPIC-ADD ON - Abnormal; Notable for the following:    Squamous Epithelial / LPF 0-5 (*)    Bacteria, UA MANY (*)    All other components within normal limits  I-STAT CG4 LACTIC ACID, ED - Abnormal; Notable for the following:    Lactic Acid, Venous 4.91 (*)    All other components within normal limits  CULTURE, BLOOD  (ROUTINE X 2)  CULTURE, BLOOD (ROUTINE X 2)  URINE CULTURE  PROTIME-INR  I-STAT CG4 LACTIC ACID, ED    Imaging Review Dg Chest 2 View  02/09/2016  CLINICAL DATA:  Acute onset of severe shortness of breath. Sepsis. Initial encounter. EXAM: CHEST  2 VIEW COMPARISON:  Chest radiograph performed 08/02/2015 FINDINGS: The lungs are well-aerated. Vascular congestion is noted. Bibasilar airspace opacities may reflect pulmonary edema or pneumonia. No definite pleural effusion or pneumothorax is seen. The heart is mildly enlarged. A pacemaker is noted overlying the left chest wall, with single lead ending overlying the right ventricle. No acute osseous abnormalities are seen. There is chronic superior subluxation of both humeral heads, with chronic resorption of the distal right clavicle. IMPRESSION: Vascular congestion and mild cardiomegaly. Bibasilar airspace opacities may reflect pulmonary edema or pneumonia. Electronically Signed   By: Garald Balding M.D.   On: 02/21/2016 04:35   I have personally reviewed and evaluated these images and lab results as part of my medical decision-making.   EKG Interpretation   Date/Time:  Monday February 06 2016 05:30:11 EST Ventricular Rate:  78 PR Interval:    QRS Duration: 97 QT Interval:  442 QTC Calculation: 503 R Axis:   108 Text Interpretation:  Atrial fibrillation Right axis deviation Repol abnrm  suggests ischemia, anterolateral Prolonged QT interval T wave inversions  Confirmed by Rhylen Shaheen  MD, Dareen Gutzwiller ([id number]) on 02/27/2016 5:53:51 AM      MDM   Final diagnoses:  Septic shock ([place])  UTI (lower urinary tract infection)   Sepsis w/u initiated.  Fluids and antibiotics ordered.  5:20 AM Lactate elevated at 4.91. Patient bolused 2 L of fluid based on weight of 145. Critical care consulted for lactate >4. Given patient is not currently requiring any pressors or respiratory support, will admit to the hospitalist for stepdown care.  5:30 AM Called by  nursing that patient's blood pressure decreasing. Currently 86/43. Confirmed manually. He is status post 1 L fluids at this point. Second liter is infusing at this time.  6:21 AM Discussed again with critical care given low blood pressures. Dr. Ashok Cordia requesting full 2 liter fluids and reassessment.  Patient currently mentating. Maps 60-62.  7:20 AM After a full 2 L of fluid, patient continues to have systolic blood pressures in the 90s. Critical care to evaluate. He is not on any baseline prednisone and should not need burst dose steroids. Of note, he does have some changes on his EKG with T-wave inversions. He is currently in atrial fibrillation. He is not currently experiencing chest pain. Troponin was added to workup.  Sepsis - Repeat Assessment  Performed at:    6:55 AM  Vitals     Blood pressure 90/76, pulse 66, temperature 101.7 F (38.7 C), temperature source Oral, resp. rate 21, height 5\' 3"  (1.6 m), weight 145 lb (65.772 kg), SpO2 98 %.  Heart:     Irregular rate and rhythm  Lungs:    Rales  Capillary Refill:   > 2 sec  Peripheral Pulse:   Radial pulse, not palpable  Skin:     Pale    Merryl Hacker, MD 02/09/2016 [id number]  Merryl Hacker, MD 02/02/2016 0735  7:51 AM Discussed with Dr. [place] Bells. Patient blood pressures trending downward. Started on 75 mL per hour of normal saline. Course Rales bilaterally. Patient still mentating. Lactate marginally improved. Levophed by peripheral IV. Critical care updated  Merryl Hacker, MD 02/20/2016 [phone]

## 2016-02-06 NOTE — H&P (Addendum)
PULMONARY / CRITICAL CARE MEDICINE   Name: Douglas George MRN: [id number] DOB: [birth year]    ADMISSION DATE:  02/14/2016 CONSULTATION DATE:  02/22/2016  REFERRING MD:  Lia Foyer  CHIEF COMPLAINT:  Feeling weak, likely UTI  HISTORY OF PRESENT ILLNESS:   82 y;/o male with a past medical history of Afib, Dm2 and pulmonary hypertension came to the [facility] ER on 2/6 with dysuria and dyspnea for 24 hours.  His wife is at the bedside and reports that he noticed discolored urine yesterday and seemed more short of breath though he didn't report it.  He had no cough, headache, fever, or chills yesterday.  However his wife noted at one point after a meal that the seemed to be coughing and she wondered if he choked on some food.  Though he didn't cough for the rest of the day, he started coughing this morning when he awoke around 2 AM with dyspnea.  She called 911 because he was so weak she knew she couldn't bring him in.  In the ER he has had fever, dyspnea, cough but denies chest pain.  No recent nausea vomiting or diarrhea.  No rash.  He has received 2 L normal saline, vancomycin and zosyn.    PAST MEDICAL HISTORY :  He  has a past medical history of Hearing loss; Skin change; Chronic atrial fibrillation ([place]); Diabetes mellitus type II, controlled ([place]); Hyperlipidemia; Depression; Ischemic heart disease; Pulmonary hypertension ([place]); Diverticulitis; H/O blood clots (1990's); Thrombocytopenia ([place]); Anal fissure; Neck mass (10/19/2013); MDS (myelodysplastic syndrome) ([place]) (12/11/2013); CHF (congestive heart failure) ([place]); Myocardial infarction ([place]) (1999); DVT (deep venous thrombosis) ([place]) (~ 2009); Pneumonia (~ 11/2013); OSA (obstructive sleep apnea); Hypothyroidism; Anemia in chronic kidney disease(285.21) (11/13/2013); GERD (gastroesophageal reflux disease); Arthritis; Spinal stenosis, lumbar; Gout; and Complete heart block ([place]).  PAST SURGICAL HISTORY: He  has past surgical history that includes  Thyroidectomy (1960); Wrist surgery (Left, ~ 2003); Achilles tendon repair (Left, ~ 2002); shrapnel (Left); Inguinal hernia repair (Right, 1980); Cataract extraction w/ intraocular lens  implant, bilateral (Bilateral, ~ 2007); Vasectomy; Colonoscopy (2009); Insert / replace / remove pacemaker (04/21/2014); Coronary angioplasty with stent (1999); Tonsillectomy and adenoidectomy (1930's); Pocket evacuation (05-11-2014); permanent pacemaker insertion (N/A, 04/21/2014); and pocket revision (N/A, 05/11/2014).  Allergies  Allergen Reactions  . Keflex [Cephalexin] Other (See Comments)    Paranoid ..  . Codeine Nausea Only  . Ramipril Cough  . Vancomycin Nausea Only    No current facility-administered medications on file prior to encounter.   Current Outpatient Prescriptions on File Prior to Encounter  Medication Sig  . ACCU-CHEK AVIVA PLUS test strip USE ONCE DAILY AS DIRECTED  . acetaminophen (TYLENOL) 500 MG tablet Take 1,000 mg by mouth every 8 (eight) hours as needed for moderate pain or headache.   . Calcium Carbonate-Vit D-Min (CALCIUM 1200 PO) Take 1,200 mg by mouth daily. Reported on 01/05/2016  . Cholecalciferol (VITAMIN D) 1000 UNITS capsule Take 1,000 Units by mouth daily.   . clotrimazole-betamethasone (LOTRISONE) cream Apply 1 application topically daily.  Marland Kitchen docusate sodium (COLACE) 100 MG capsule Take 100 mg by mouth 2 (two) times daily.  . Flaxseed, Linseed, (FLAX SEED OIL PO) Take 1 tablet by mouth daily.   . fluorouracil (EFUDEX) 5 % cream Apply 1 application topically daily as needed (rash).   . furosemide (LASIX) 80 MG tablet TAKE 1 TABLET BY MOUTH EVERY DAY (Patient taking differently: TAKE 0.5 TABLET BY MOUTH EVERY DAY)  . levothyroxine (SYNTHROID, LEVOTHROID) 150 MCG  tablet TAKE 1 TABLET BY MOUTH EVERY MORNING ON AN EMPTY STOMACH  . losartan (COZAAR) 50 MG tablet TAKE 1 TABLET(50 MG) BY MOUTH DAILY  . Misc Natural Products (OSTEO BI-FLEX JOINT SHIELD PO) Take 1 tablet by mouth 2  (two) times daily.   Marland Kitchen oxybutynin (DITROPAN) 5 MG tablet Take 5 mg by mouth 2 (two) times daily.   . polyethylene glycol (MIRALAX / GLYCOLAX) packet Take 17 g by mouth daily as needed for mild constipation.   . Pyridoxine HCl (VITAMIN B-6) 250 MG tablet Take 250 mg by mouth daily.  Marland Kitchen tobramycin (TOBREX) 0.3 % ophthalmic solution Place 1 drop into both eyes 2 (two) times daily as needed (stinging).   . vitamin B-12 (CYANOCOBALAMIN) 1000 MCG tablet Take 1,000 mcg by mouth daily.  . vitamin C (ASCORBIC ACID) 500 MG tablet Take 500 mg by mouth at bedtime.   Marland Kitchen warfarin (COUMADIN) 2.5 MG tablet TAKE AS DIRECTED BY ANTICOAGULATION CLINIC (Patient taking differently: Take one tablet every evening.)    FAMILY HISTORY:  His indicated that his mother is deceased. He indicated that his father is deceased. He indicated that his sister is alive. He indicated that his brother is deceased.   SOCIAL HISTORY: He  reports that he quit smoking about 64 years ago. His smoking use included Cigarettes. He has a 2 pack-year smoking history. He has never used smokeless tobacco. He reports that he drinks about 4.2 oz of alcohol per week. He reports that he does not use illicit drugs.  REVIEW OF SYSTEMS:   10 point review of systems negative except as per HPI above  SUBJECTIVE:  As above  VITAL SIGNS: BP 79/43 mmHg  Pulse 80  Temp(Src) 101.3 F (38.5 C) (Oral)  Resp 25  Ht 5\' 3"  (1.6 m)  Wt 65.772 kg (145 lb)  BMI 25.69 kg/m2  SpO2 91%   On 4 L [place] (O2 saturation dropping to 86% frequently)  HEMODYNAMICS:    VENTILATOR SETTINGS:    INTAKE / OUTPUT: I/O last 3 completed shifts: In: 1000 [I.V.:1000] Out: -   PHYSICAL EXAMINATION: General:  Frail elderly male in mild respiratory distress Neuro:  Awake, alert, oriented and answering questions appropriately, moves all four extremities HEENT:  NCAT, OP clear, EOMi, mucus membranes dry Cardiovascular:  Irreg irreg, systolic murmur Lungs:   Surprisingly clear to auscultation, no rales, increased respiratory effort but no accessory muscle use Abdomen:  Soft, nontender, bowel sounds are positive Musculoskeletal:  Diminished bulk as expected for age, normal tone Skin:  Thin skin with multiple bruises on extensor surfaces  LABS:  BMET  Recent Labs Lab 02/14/2016 0445  NA 138  K 4.5  CL 104  CO2 16*  BUN 57*  CREATININE 2.05*  GLUCOSE 125*    Electrolytes  Recent Labs Lab 02/13/2016 0445  CALCIUM 9.0    CBC  Recent Labs Lab 02/20/2016 0445  WBC 22.8*  HGB 11.4*  HCT 37.1*  PLT 225    Coag's  Recent Labs Lab 02/11/2016 0445  INR 4.65*    Sepsis Markers  Recent Labs Lab 02/27/2016 0452 02/18/2016 0733  LATICACIDVEN 4.91* 3.55*    ABG No results for input(s): PHART, PCO2ART, PO2ART in the last 168 hours.  Liver Enzymes  Recent Labs Lab 02/26/2016 0445  AST 32  ALT 18  ALKPHOS 123  BILITOT 1.3*  ALBUMIN 3.5    Cardiac Enzymes No results for input(s): TROPONINI, PROBNP in the last 168 hours.  Glucose No results for input(s):  GLUCAP in the last 168 hours.  Imaging 2/6 CXR images personally reviewed> bibasilar infiltrates R > L mild cardiomegally  STUDIES:    CULTURES: 2/6 blood > 2/6 urine >   ANTIBIOTICS: 2/6 Vanc > 2/6 2/6 Zosyn > 2/6 2/6 Aztreonam > 2/6 2/6 Azithromycin > 2/6  SIGNIFICANT EVENTS:   LINES/TUBES: 2/6 R IJ CVL >   DISCUSSION: 82 y/o male with multiple chronic medical issues came to the ER on 2/6 with septic shock presumably due to a UTI but also with aspiration vs community acquired pneumonia.   ASSESSMENT / PLAN:  PULMONARY A: Acute respiratory failure with hypoxemia > high risk for mechanical ventilation Aspiration vs community acquired pneumonia Consider Acute Pulmonary Edema Obstructive sleep apnea with chronic hypercapnea on BIPAP P:   Monitor in ICU BIPAP qHS O2 as needed to maintain O2 > 90% Limit further IVF  CARDIOVASCULAR A:  Septic  shock > now on levophed, lactic acid improving Afib Chronic CHF Secondary pulmonary hypertension P:  Place CVL now No more IVF Levophed for MAP > 82 Hold home hypertensive meds Repeat lactic acid  RENAL A:   CKD Anion Gap acidosis due to lactic acidosis P:   Monitor BMET and UOP Replace electrolytes as needed Repeat BMET later today  GASTROINTESTINAL A:   No acute issues P:   NPO  HEMATOLOGIC A:   Myelodysplasia P:  Notify Oncology of admission  INFECTIOUS A:   UTI Community acquired vs aspiration pneumonia P:   Change antibiotics to Aztreonam and Azithromycin Monitor cultures  ENDOCRINE A:   DM2 Hypothyroidism P:   Continue synthroid SSI  NEUROLOGIC A:   No acute issues P:   Monitor neurologic status   FAMILY  - Updates: wife updated bedside 2/6 AM.   I had a lengthy conversation with the patient and his wife explaining that he is very very ill and with septic shock he has a very high likelihood of not surviving.  I also explained that should he worsen to the point of needing mechanical ventilatory support the likelihood of recovery would be very low.  They still feel that his code status should be full coded.  I advised that they consider limiting care.  For now, FULL CODE.    I advised the wife to notify family of the severity of his condition.  - Inter-disciplinary family meet or Palliative Care meeting due by:  day 7  My cc time 64 minutes  Sepsis - Repeat Assessment  Performed at:    0810  Vitals     Blood pressure 86/34, pulse 94, temperature 100.8 F (38.2 C), temperature source Oral, resp. rate 24, height 5\' 3"  (1.6 m), weight 65.772 kg (145 lb), SpO2 91 %.  Heart:     Irregular rate and rhythm  Lungs:    CTA, tachypneic, weak cough  Capillary Refill:   <2 sec  Peripheral Pulse:   Radial pulse palpable weak  Skin:     Dry    Roselie Awkward, MD [place] P[facility]M Pager: [phone] Cell: [phone] After 3pm or if no response,  call [phone]   02/14/2016, 8:40 AM

## 2016-02-06 NOTE — Procedures (Signed)
Arterial Catheter Insertion Procedure Note Douglas George [id number] 1927/04/09  Procedure: Insertion of Arterial Catheter  Indications: Blood pressure monitoring  Procedure Details Consent: Risks of procedure as well as the alternatives and risks of each were explained to the (patient/caregiver).  Consent for procedure obtained. Time Out: Verified patient identification, verified procedure, site/side was marked, verified correct patient position, special equipment/implants available, medications/allergies/relevent history reviewed, required imaging and test results available.  Performed  Maximum sterile technique was used including antiseptics, cap, gloves, gown, hand hygiene, mask and sheet. Skin prep: Chlorhexidine; local anesthetic administered 20 gauge catheter was inserted into right radial artery using the Seldinger technique.  Evaluation Blood flow good; BP tracing good. Complications: No apparent complications.   Douglas George 02/12/2016

## 2016-02-06 NOTE — ED Notes (Signed)
Attempted report x1. 

## 2016-02-06 NOTE — Progress Notes (Signed)
UR Completed. Arsalan Brisbin, RN, BSN.  [phone] 

## 2016-02-06 NOTE — Progress Notes (Signed)
Wedding band removed from left hand and given to wife at bedside.

## 2016-02-06 NOTE — ED Notes (Signed)
Critical care at bedside  

## 2016-02-06 NOTE — Progress Notes (Addendum)
ANTICOAGULATION CONSULT NOTE - Initial Consult  Pharmacy Consult for Coumadin Indication: atrial fibrillation and history of DVT (2009)  Allergies  Allergen Reactions  . Keflex [Cephalexin] Other (See Comments)    Paranoid ..  . Codeine Nausea Only  . Ramipril Cough  . Vancomycin Nausea Only    Patient Measurements: Height: 5\' 3"  (160 cm) Weight: 145 lb (65.772 kg) IBW/kg (Calculated) : 56.9  Vital Signs: Temp: 98.3 F (36.8 C) (02/06 1029) Temp Source: Oral (02/06 1029) BP: 86/34 mmHg (02/06 0900) Pulse Rate: 88 (02/06 1015)  Labs:  Recent Labs  02/03/2016 0445  HGB 11.4*  HCT 37.1*  PLT 225  LABPROT 42.6*  INR 4.65*  CREATININE 2.05*    Estimated Creatinine Clearance: 20.8 mL/min (by C-G formula based on Cr of 2.05).   Medical History: Past Medical History  Diagnosis Date  . Hearing loss   . Skin change   . Chronic atrial fibrillation ([place])   . Diabetes mellitus type II, controlled ([place])   . Hyperlipidemia   . Depression   . Ischemic heart disease     remote stenting of the RCA in 1998 with residual LAD disease of 60 to 70% with negative nuclear in 2011  . Pulmonary hypertension ([place])     per echo in 2011  . Diverticulitis   . H/O blood clots 1990's    in L leg (related to being in a cast for surgery)  . Thrombocytopenia ([place])   . Anal fissure   . Neck mass 10/19/2013  . MDS (myelodysplastic syndrome) ([place]) 12/11/2013  . CHF (congestive heart failure) ([place])   . Myocardial infarction ([place]) 1999  . DVT (deep venous thrombosis) ([place]) ~ 2009    "LLE"  . Pneumonia ~ 11/2013    "tx'd; not really sure if he had it or not"  . OSA (obstructive sleep apnea)     BiPAP  . Hypothyroidism   . Anemia in chronic kidney disease(285.21) 11/13/2013  . GERD (gastroesophageal reflux disease)   . Arthritis     "joints" (04/21/2014)  . Spinal stenosis, lumbar   . Gout     "little bit in the left foot"  . Complete heart block [facility])     s/p single chamber PPM  implantation April 2015 [facility])    Medications:  Prescriptions prior to admission  Medication Sig Dispense Refill Last Dose  . ACCU-CHEK AVIVA PLUS test strip USE ONCE DAILY AS DIRECTED 50 each 5 02/05/2016 at Unknown time  . acetaminophen (TYLENOL) 500 MG tablet Take 1,000 mg by mouth every 8 (eight) hours as needed for moderate pain or headache.    02/28/2016 at Unknown time  . Calcium Carbonate-Vit D-Min (CALCIUM 1200 PO) Take 1,200 mg by mouth daily. Reported on 01/05/2016   02/05/2016 at Unknown time  . Cholecalciferol (VITAMIN D) 1000 UNITS capsule Take 1,000 Units by mouth daily.    02/05/2016 at Unknown time  . clotrimazole-betamethasone (LOTRISONE) cream Apply 1 application topically daily.   02/05/2016 at Unknown time  . docusate sodium (COLACE) 100 MG capsule Take 100 mg by mouth 2 (two) times daily.   02/05/2016 at Unknown time  . Flaxseed, Linseed, (FLAX SEED OIL PO) Take 1 tablet by mouth daily.    02/05/2016 at Unknown time  . fluorouracil (EFUDEX) 5 % cream Apply 1 application topically daily as needed (rash).   2 unk  . furosemide (LASIX) 80 MG tablet TAKE 1 TABLET BY MOUTH EVERY DAY (Patient taking differently: TAKE 0.5 TABLET  BY MOUTH EVERY DAY) 30 tablet 3 02/05/2016 at Unknown time  . levothyroxine (SYNTHROID, LEVOTHROID) 150 MCG tablet TAKE 1 TABLET BY MOUTH EVERY MORNING ON AN EMPTY STOMACH 30 tablet 5 02/05/2016 at Unknown time  . losartan (COZAAR) 50 MG tablet TAKE 1 TABLET(50 MG) BY MOUTH DAILY 30 tablet 0 02/05/2016 at Unknown time  . Misc Natural Products (OSTEO BI-FLEX JOINT SHIELD PO) Take 1 tablet by mouth 2 (two) times daily.    02/05/2016 at Unknown time  . oxybutynin (DITROPAN) 5 MG tablet Take 5 mg by mouth 2 (two) times daily.    02/05/2016 at Unknown time  . polyethylene glycol (MIRALAX / GLYCOLAX) packet Take 17 g by mouth daily as needed for mild constipation.    unk  . Pyridoxine HCl (VITAMIN B-6) 250 MG tablet Take 250 mg by mouth daily.   02/05/2016 at Unknown time  .  tobramycin (TOBREX) 0.3 % ophthalmic solution Place 1 drop into both eyes 2 (two) times daily as needed (stinging).    couple months  . vitamin B-12 (CYANOCOBALAMIN) 1000 MCG tablet Take 1,000 mcg by mouth daily.   02/05/2016 at Unknown time  . vitamin C (ASCORBIC ACID) 500 MG tablet Take 500 mg by mouth at bedtime.    02/05/2016 at Unknown time  . warfarin (COUMADIN) 2.5 MG tablet TAKE AS DIRECTED BY ANTICOAGULATION CLINIC (Patient taking differently: Take one tablet every evening.) 35 tablet 0 02/05/2016 at Unknown time    Assessment: 82 year old male admitted with dysuria and dyspnea x24 hours on chronic Coumadin for atrial fibrillation and history of DVT (2009). Pharmacy to continue dosing inpatient.   INR on admission elevated at 4.65.  Home dose 2.5mg  po daily.   Goal of Therapy:  INR 2-3 Monitor platelets by anticoagulation protocol: Yes   Plan:  No Coumadin today due to elevated INR.  Daily PT/INR.  Monitor for signs and symptoms of bleeding.   Sloan Leiter, PharmD, BCPS Clinical Pharmacist [phone] 02/05/2016,10:42 AM

## 2016-02-06 NOTE — ED Notes (Signed)
Notified MD pt's pressure with dopler 78 systolic. Automatic reading 0000000 systolic. Pt alert. MD will order levophed

## 2016-02-06 NOTE — Procedures (Signed)
Central Venous Catheter Insertion Procedure Note Douglas George [id number] November 05, 1927  Procedure: Insertion of Central Venous Catheter Indications: Assessment of intravascular volume, Drug and/or fluid administration and Frequent blood sampling  Procedure Details Consent: Risks of procedure as well as the alternatives and risks of each were explained to the (patient/caregiver).  Consent for procedure obtained. Time Out: Verified patient identification, verified procedure, site/side was marked, verified correct patient position, special equipment/implants available, medications/allergies/relevent history reviewed, required imaging and test results available.  Performed Real time [place] used to ID and cannulate the vessel.  Maximum sterile technique was used including antiseptics, cap, gloves, gown, hand hygiene, mask and sheet. Skin prep: Chlorhexidine; local anesthetic administered A antimicrobial bonded/coated triple lumen catheter was placed in the right internal jugular vein using the Seldinger technique.  Evaluation Blood flow good Complications: No apparent complications Patient did tolerate procedure well. Chest X-ray ordered to verify placement.  CXR: pending.  Clementeen Graham 02/06/2016, 8:59 AM

## 2016-02-06 NOTE — Progress Notes (Signed)
Wife called, patients home Bipap settings are 5.0-9.0. RT notified.

## 2016-02-06 NOTE — Progress Notes (Signed)
Pharmacy Antibiotic Note  Douglas George is a 82 y.o. male admitted on 02/11/2016 with sepsis.  Pharmacy has been consulted for Vancomycin and Zosyn dosing. WBC elevated. Scr is elevated at 2.05. Lactic acid elevated.   Plan: -Vancomycin 1000 mg IV load ordered in ED, then given 500 mg IV q24h  -Zosyn 3.375G IV q8h to be infused over 4 hours -Trend WBC, temp, renal function -Drug levels as indicated   Height: 5\' 3"  (160 cm) Weight: 145 lb (65.772 kg) IBW/kg (Calculated) : 56.9  Temp (24hrs), Avg:99 F (37.2 C), Min:99 F (37.2 C), Max:99 F (37.2 C)   Recent Labs Lab 02/21/2016 0445 02/04/2016 0452  WBC 22.8*  --   LATICACIDVEN  --  4.91*    CrCl cannot be calculated (Patient has no serum creatinine result on file.).    Allergies  Allergen Reactions  . Keflex [Cephalexin] Other (See Comments)    Paranoid ..  . Codeine Nausea Only  . Ramipril Cough  . Vancomycin Nausea Only   Thank you for allowing pharmacy to be a part of this patient's care.  Narda Bonds 02/12/2016 5:14 AM

## 2016-02-06 NOTE — Progress Notes (Signed)
ANTIBIOTIC CONSULT NOTE - INITIAL  Pharmacy Consult for Vancomycin  Indication: bacteremia  Allergies  Allergen Reactions  . Keflex [Cephalexin] Other (See Comments)    Paranoid ..  . Codeine Nausea Only  . Ramipril Cough  . Vancomycin Nausea Only    Patient Measurements: Height: 5\' 3"  (160 cm) Weight: 145 lb (65.772 kg) IBW/kg (Calculated) : 56.9  Vital Signs: Temp: 99.5 F (37.5 C) (02/06 2100) Temp Source: Core (Comment) (02/06 2000) BP: 110/75 mmHg (02/06 2100) Pulse Rate: 89 (02/06 2100) Intake/Output from previous day: 02/05 0701 - 02/06 0700 In: 1000 [I.V.:1000] Out: -  Intake/Output from this shift: Total I/O In: 206.6 [I.V.:206.6] Out: 40 [Urine:40]  Labs:  Recent Labs  02/05/2016 0445 02/10/2016 1544  WBC 22.8*  --   HGB 11.4*  --   PLT 225  --   CREATININE 2.05* 2.64*   Estimated Creatinine Clearance: 16.2 mL/min (by C-G formula based on Cr of 2.64). No results for input(s): VANCOTROUGH, VANCOPEAK, VANCORANDOM, GENTTROUGH, GENTPEAK, GENTRANDOM, TOBRATROUGH, TOBRAPEAK, TOBRARND, AMIKACINPEAK, AMIKACINTROU, AMIKACIN in the last 72 hours.   Microbiology: Recent Results (from the past 720 hour(s))  Culture, blood (routine x 2)     Status: None (Preliminary result)   Collection Time: 02/12/2016  4:40 AM  Result Value Ref Range Status   Specimen Description BLOOD LEFT HAND  Final   Special Requests IN PEDIATRIC BOTTLE 3CC  Final   Culture  Setup Time   Final    GRAM POSITIVE COCCI IN CHAINS IN PAIRS AEROBIC BOTTLE ONLY CRITICAL RESULT CALLED TO, READ BACK BY AND VERIFIED WITH: C WOFFORD RN [id number] 02/08/2016 A BROWNING    Culture PENDING  Incomplete   Report Status PENDING  Incomplete  Culture, blood (routine x 2)     Status: None (Preliminary result)   Collection Time: 02/27/2016  4:45 AM  Result Value Ref Range Status   Specimen Description BLOOD RIGHT HAND  Final   Special Requests BOTTLES DRAWN AEROBIC AND ANAEROBIC 10CC  Final   Culture  Setup Time    Final    GRAM POSITIVE COCCI IN CHAINS IN PAIRS AEROBIC BOTTLE ONLY CRITICAL RESULT CALLED TO, READ BACK BY AND VERIFIED WITHMila Palmer RN [id number] 02/20/2016 A BROWNING    Culture PENDING  Incomplete   Report Status PENDING  Incomplete  MRSA PCR Screening     Status: None   Collection Time: 02/15/2016 11:25 AM  Result Value Ref Range Status   MRSA by PCR NEGATIVE NEGATIVE Final    Comment:        The GeneXpert MRSA Assay (FDA approved for NASAL specimens only), is one component of a comprehensive MRSA colonization surveillance program. It is not intended to diagnose MRSA infection nor to guide or monitor treatment for MRSA infections.    Assessment: 82 y.o. male admitted with urosepsis and PNA, now with bacteremia, for Vancomycin.  Vancomycin 1 g IV given in ED this morning at 0615  Goal of Therapy:  Vancomycin trough level 15-20 mcg/ml  Plan:  Vancomycin 500 mg IV q48h  Caryl Pina 02/03/2016,11:05 PM

## 2016-02-07 ENCOUNTER — Inpatient Hospital Stay (HOSPITAL_COMMUNITY): Payer: Medicare Other

## 2016-02-07 LAB — URINE CULTURE: CULTURE: NO GROWTH

## 2016-02-07 LAB — BASIC METABOLIC PANEL
ANION GAP: 17 — AB (ref 5–15)
BUN: 67 mg/dL — ABNORMAL HIGH (ref 6–20)
CALCIUM: 8.1 mg/dL — AB (ref 8.9–10.3)
CO2: 13 mmol/L — ABNORMAL LOW (ref 22–32)
Chloride: 105 mmol/L (ref 101–111)
Creatinine, Ser: 2.9 mg/dL — ABNORMAL HIGH (ref 0.61–1.24)
GFR, EST AFRICAN AMERICAN: 21 mL/min — AB (ref >60)
GFR, EST NON AFRICAN AMERICAN: 18 mL/min — AB (ref >60)
GLUCOSE: 70 mg/dL (ref 65–99)
Potassium: 5.1 mmol/L (ref 3.5–5.1)
SODIUM: 135 mmol/L (ref 135–145)

## 2016-02-07 LAB — BLOOD GAS, ARTERIAL
Acid-base deficit: 12.7 mmol/L — ABNORMAL HIGH (ref 0.0–2.0)
BICARBONATE: 12.6 meq/L — AB (ref 20.0–24.0)
DELIVERY SYSTEMS: POSITIVE
O2 Content: 10 L/min
O2 Saturation: 96.4 %
PH ART: 7.286 — AB (ref 7.350–7.450)
Patient temperature: 98.6
TCO2: 13.4 mmol/L (ref 0–100)
pCO2 arterial: 27.3 mmHg — ABNORMAL LOW (ref 35.0–45.0)
pO2, Arterial: 92.9 mmHg (ref 80.0–100.0)

## 2016-02-07 LAB — CBC
HCT: 35.9 % — ABNORMAL LOW (ref 39.0–52.0)
Hemoglobin: 10.8 g/dL — ABNORMAL LOW (ref 13.0–17.0)
MCH: 27.8 pg (ref 26.0–34.0)
MCHC: 30.1 g/dL (ref 30.0–36.0)
MCV: 92.3 fL (ref 78.0–100.0)
PLATELETS: 125 10*3/uL — AB (ref 150–400)
RBC: 3.89 MIL/uL — ABNORMAL LOW (ref 4.22–5.81)
RDW: 19.3 % — AB (ref 11.5–15.5)
WBC: 35.8 10*3/uL — AB (ref 4.0–10.5)

## 2016-02-07 LAB — MAGNESIUM: Magnesium: 1.7 mg/dL (ref 1.7–2.4)

## 2016-02-07 LAB — GLUCOSE, CAPILLARY
GLUCOSE-CAPILLARY: 106 mg/dL — AB (ref 65–99)
GLUCOSE-CAPILLARY: 47 mg/dL — AB (ref 65–99)
Glucose-Capillary: 77 mg/dL (ref 65–99)
Glucose-Capillary: 56 mg/dL — ABNORMAL LOW (ref 65–99)
Glucose-Capillary: 84 mg/dL (ref 65–99)
Glucose-Capillary: 99 mg/dL (ref 65–99)

## 2016-02-07 LAB — PROTIME-INR
INR: 7.81 — AB (ref 0.00–1.49)
Prothrombin Time: 62.8 seconds — ABNORMAL HIGH (ref 11.6–15.2)

## 2016-02-07 LAB — PHOSPHORUS: PHOSPHORUS: 4.9 mg/dL — AB (ref 2.5–4.6)

## 2016-02-07 LAB — LACTIC ACID, PLASMA: LACTIC ACID, VENOUS: 5.4 mmol/L — AB (ref 0.5–2.0)

## 2016-02-07 MED ORDER — MORPHINE BOLUS VIA INFUSION
5.0000 mg | INTRAVENOUS | Status: DC | PRN
  Filled 2016-02-07: qty 20

## 2016-02-07 MED ORDER — DEXTROSE 50 % IV SOLN
INTRAVENOUS | Status: AC
  Filled 2016-02-07: qty 50

## 2016-02-07 MED ORDER — ACETAMINOPHEN 650 MG RE SUPP
650.0000 mg | Freq: Four times a day (QID) | RECTAL | Status: DC | PRN
  Administered 2016-02-07: 650 mg via RECTAL
  Filled 2016-02-07: qty 1

## 2016-02-07 MED ORDER — DEXTROSE 5 % IV SOLN: INTRAVENOUS | Status: DC

## 2016-02-07 MED ORDER — MORPHINE SULFATE (PF) 4 MG/ML IV SOLN
4.0000 mg | Freq: Once | INTRAVENOUS | Status: AC
  Administered 2016-02-07: 2 mg via INTRAVENOUS

## 2016-02-07 MED ORDER — MORPHINE SULFATE (PF) 4 MG/ML IV SOLN
INTRAVENOUS | Status: AC
  Administered 2016-02-07: 2 mg via INTRAVENOUS
  Filled 2016-02-07: qty 1

## 2016-02-07 MED ORDER — LEVOTHYROXINE SODIUM 100 MCG IV SOLR
75.0000 ug | Freq: Every day | INTRAVENOUS | Status: DC
  Filled 2016-02-07: qty 5

## 2016-02-07 MED ORDER — DEXTROSE 5 % IV SOLN
10.0000 mg/h | INTRAVENOUS | Status: DC
  Filled 2016-02-07: qty 10

## 2016-02-07 MED ORDER — DEXTROSE-NACL 5-0.9 % IV SOLN
INTRAVENOUS | Status: DC
  Administered 2016-02-07: 12:00:00 via INTRAVENOUS

## 2016-02-07 MED ORDER — MAGNESIUM SULFATE IN D5W 10-5 MG/ML-% IV SOLN
1.0000 g | Freq: Once | INTRAVENOUS | Status: DC
  Filled 2016-02-07: qty 100

## 2016-02-08 ENCOUNTER — Other Ambulatory Visit: Payer: Self-pay | Admitting: Hematology and Oncology

## 2016-02-08 ENCOUNTER — Ambulatory Visit: Payer: Medicare Other

## 2016-02-08 ENCOUNTER — Other Ambulatory Visit: Payer: Medicare Other

## 2016-02-08 LAB — CULTURE, BLOOD (ROUTINE X 2)

## 2016-02-09 ENCOUNTER — Telehealth: Payer: Self-pay

## 2016-02-09 LAB — CULTURE, BLOOD (ROUTINE X 2)

## 2016-02-09 NOTE — Telephone Encounter (Signed)
On 02-17-16 I received a death certificate from [place] (Original). The death certificate is for cremation. The patient is a patient of Doctor Maneem. The death certificate will be taken to [facility] (2100) this pm for signature. On 02/17/2016 I received the death certificate back from Doctor Maneem. I got the death certificate ready and called the funeral home to let them know the death certificate is ready for pickup.

## 2016-02-13 ENCOUNTER — Ambulatory Visit: Payer: Medicare Other | Admitting: Cardiology

## 2016-02-17 ENCOUNTER — Ambulatory Visit: Payer: Medicare Other | Admitting: Cardiology

## 2016-02-29 ENCOUNTER — Ambulatory Visit: Payer: Medicare Other

## 2016-02-29 ENCOUNTER — Other Ambulatory Visit: Payer: Medicare Other

## 2016-02-29 NOTE — Progress Notes (Signed)
   02/09/2016 1317  Clinical Encounter Type  Visited With Patient and family together;Health care provider  Visit Type Initial;Critical Care  Referral From Nurse  Spiritual Encounters  Spiritual Needs Emotional;Grief support  Stress Factors  Family Stress Factors Health changes;Major life changes;Loss   Chaplain responded to a request to visit with patient's family as patient is not doing well. Chaplain met with them, facilitated life review and offered support. Chaplain services available as needed.   Jeri Lager, Chaplain 02-09-16 1:18 PM

## 2016-02-29 NOTE — Progress Notes (Signed)
Patient's blood pressure began to drop suddenly, patient become dependent on pacer, sat dropping on Bipap, no longer responding to wife. MD notified to speak with family as they were making decision on intubation. MD in room to speak with patients wife and daughter. At that time decision was made to discontinue Bipap and allow patient to pass in peace. Orders received for comfort care. Chaplain services provided. Patient appears comfortable at this time.   At 13:45 patient noted to have dilated pupils which were not reactive. Patient with no respirations or heart sounds. [employer] as second verify. MD notified of time of death. [place] notifed. Family at bedside at this time.

## 2016-02-29 NOTE — Discharge Summary (Signed)
Name:Stephenson D Mcfeaters [id number] DOB:07-Dec-1927   ADMISSION DATE: February 23, 2016 DEATH DATE: February 23, 2016  82 year old with history of A. fib, diabetes mellitus, severe pulmonary hypertension. Admitted with severe septic shock, multiorgan failure, gram-positive bacteremia, UTI, pneumonia. Patient was initially treated with BiPAP, antibiotics, pressors. He continued to deteriorate during the course of his stay with worsening shock, renal failure, respiratory failure.  After multiple conversations with the family, wife the decision was made to withdraw care and make him comfort measures only. Patient passed away at 1:45 PM.  Marshell Garfinkel MD [facility] Pager [phone] If no answer or after 3pm call: [phone] February 24, 2016, 4:30 PM

## 2016-02-29 NOTE — Progress Notes (Signed)
Patient placed on 2 Lpm nasal cannula for comfort.

## 2016-02-29 NOTE — Clinical Documentation Improvement (Signed)
Critical Care  Can the diagnosis of CHF be further specified in progress notes and discharge summary?   Chronic diastolic CHF  Chronic systolic CHF  Chroinic combined CHF  Other  Clinically Undetermined   Document any associated diagnoses/conditions   Supporting Information: History of CHF H&P Chronic CHF 2/6 CXRAY: IMPRESSION: Vascular congestion and mild cardiomegaly. Bibasilar airspace opacities may reflect pulmonary edema or pneumonia 2/7 CXRAY IMPRESSION: Slight interval worsening in the appearance of the right lung which may reflect layering of increased pleural fluid posteriorly. Persistent left lower lobe atelectasis or pneumonia. Increased pulmonary vascular congestion.  Home medication: Lasix 80mg  1 every other day & 1/2 every other day  Treatment Daily weights Strict I&O   Please exercise your independent, professional judgment when responding. A specific answer is not anticipated or expected.   Thank You,  [place] [phone]

## 2016-02-29 NOTE — Progress Notes (Signed)
PULMONARY / CRITICAL CARE MEDICINE   Name: Douglas George MRN: [id number] DOB: [birth year]    ADMISSION DATE:  02/20/2016 CONSULTATION DATE:  02/25/2016  REFERRING MD:  Lia Foyer  CHIEF COMPLAINT:  Feeling weak, likely UTI  HISTORY OF PRESENT ILLNESS:   82 y;/o male with a past medical history of Afib, Dm2 and pulmonary hypertension came to the [facility] ER on 2/6 with dysuria and dyspnea for 24 hours.  His wife is at the bedside and reports that he noticed discolored urine yesterday and seemed more short of breath though he didn't report it.  He had no cough, headache, fever, or chills yesterday.  However his wife noted at one point after a meal that the seemed to be coughing and she wondered if he choked on some food.  Though he didn't cough for the rest of the day, he started coughing this morning when he awoke around 2 AM with dyspnea.  She called 911 because he was so weak she knew she couldn't bring him in.  In the ER he has had fever, dyspnea, cough but denies chest pain.  No recent nausea vomiting or diarrhea.  No rash.  He has received 2 L normal saline, vancomycin and zosyn.    PAST MEDICAL HISTORY :  He  has a past medical history of Hearing loss; Skin change; Chronic atrial fibrillation ([place]); Diabetes mellitus type II, controlled ([place]); Hyperlipidemia; Depression; Ischemic heart disease; Pulmonary hypertension ([place]); Diverticulitis; H/O blood clots (1990's); Thrombocytopenia ([place]); Anal fissure; Neck mass (10/19/2013); MDS (myelodysplastic syndrome) ([place]) (12/11/2013); CHF (congestive heart failure) ([place]); Myocardial infarction ([place]) (1999); DVT (deep venous thrombosis) ([place]) (~ 2009); Pneumonia (~ 11/2013); OSA (obstructive sleep apnea); Hypothyroidism; Anemia in chronic kidney disease(285.21) (11/13/2013); GERD (gastroesophageal reflux disease); Arthritis; Spinal stenosis, lumbar; Gout; and Complete heart block ([place]).  PAST SURGICAL HISTORY: He  has past surgical history that includes  Thyroidectomy (1960); Wrist surgery (Left, ~ 2003); Achilles tendon repair (Left, ~ 2002); shrapnel (Left); Inguinal hernia repair (Right, 1980); Cataract extraction w/ intraocular lens  implant, bilateral (Bilateral, ~ 2007); Vasectomy; Colonoscopy (2009); Insert / replace / remove pacemaker (04/21/2014); Coronary angioplasty with stent (1999); Tonsillectomy and adenoidectomy (1930's); Pocket evacuation (05-11-2014); permanent pacemaker insertion (N/A, 04/21/2014); and pocket revision (N/A, 05/11/2014).  Allergies  Allergen Reactions  . Keflex [Cephalexin] Other (See Comments)    Paranoid ..  . Codeine Nausea Only  . Ramipril Cough  . Vancomycin Nausea Only    No current facility-administered medications on file prior to encounter.   Current Outpatient Prescriptions on File Prior to Encounter  Medication Sig  . ACCU-CHEK AVIVA PLUS test strip USE ONCE DAILY AS DIRECTED  . acetaminophen (TYLENOL) 500 MG tablet Take 1,000 mg by mouth every 8 (eight) hours as needed for moderate pain or headache.   . Calcium Carbonate-Vit D-Min (CALCIUM 1200 PO) Take 1,200 mg by mouth daily. Reported on 01/05/2016  . Cholecalciferol (VITAMIN D) 1000 UNITS capsule Take 1,000 Units by mouth daily.   . clotrimazole-betamethasone (LOTRISONE) cream Apply 1 application topically daily.  Marland Kitchen docusate sodium (COLACE) 100 MG capsule Take 100 mg by mouth 2 (two) times daily.  . Flaxseed, Linseed, (FLAX SEED OIL PO) Take 1 tablet by mouth daily.   . fluorouracil (EFUDEX) 5 % cream Apply 1 application topically daily as needed (rash).   . furosemide (LASIX) 80 MG tablet TAKE 1 TABLET BY MOUTH EVERY DAY (Patient taking differently: TAKE 0.5 TABLET BY MOUTH EVERY DAY)  . levothyroxine (SYNTHROID, LEVOTHROID) 150 MCG  tablet TAKE 1 TABLET BY MOUTH EVERY MORNING ON AN EMPTY STOMACH  . losartan (COZAAR) 50 MG tablet TAKE 1 TABLET(50 MG) BY MOUTH DAILY  . Misc Natural Products (OSTEO BI-FLEX JOINT SHIELD PO) Take 1 tablet by mouth 2  (two) times daily.   Marland Kitchen oxybutynin (DITROPAN) 5 MG tablet Take 5 mg by mouth 2 (two) times daily.   . polyethylene glycol (MIRALAX / GLYCOLAX) packet Take 17 g by mouth daily as needed for mild constipation.   . Pyridoxine HCl (VITAMIN B-6) 250 MG tablet Take 250 mg by mouth daily.  Marland Kitchen tobramycin (TOBREX) 0.3 % ophthalmic solution Place 1 drop into both eyes 2 (two) times daily as needed (stinging).   . vitamin B-12 (CYANOCOBALAMIN) 1000 MCG tablet Take 1,000 mcg by mouth daily.  . vitamin C (ASCORBIC ACID) 500 MG tablet Take 500 mg by mouth at bedtime.   Marland Kitchen warfarin (COUMADIN) 2.5 MG tablet TAKE AS DIRECTED BY ANTICOAGULATION CLINIC (Patient taking differently: Take one tablet every evening.)    FAMILY HISTORY:  His indicated that his mother is deceased. He indicated that his father is deceased. He indicated that his sister is alive. He indicated that his brother is deceased.   SOCIAL HISTORY: He  reports that he quit smoking about 64 years ago. His smoking use included Cigarettes. He has a 2 pack-year smoking history. He has never used smokeless tobacco. He reports that he drinks about 4.2 oz of alcohol per week. He reports that he does not use illicit drugs.  REVIEW OF SYSTEMS:   10 point review of systems negative except as per HPI above  SUBJECTIVE:  As above  VITAL SIGNS: BP 63/50 mmHg  Pulse 60  Temp(Src) 102.1 F (38.9 C) (Core (Comment))  Resp 0  Ht 5\' 3"  (1.6 m)  Wt 153 lb (69.4 kg)  BMI 27.11 kg/m2  SpO2 92%   On 4 L [place] (O2 saturation dropping to 86% frequently)  HEMODYNAMICS: CVP:  [12 mmHg-19 mmHg] 19 mmHg  VENTILATOR SETTINGS: Vent Mode:  [-]  FiO2 (%):  [50 %] 50 %  INTAKE / OUTPUT: I/O last 3 completed shifts: In: 3249.9 [I.V.:2899.9; IV Piggyback:350] Out: 450 [Urine:450]  PHYSICAL EXAMINATION: General:  Frail male, in moderate distress on vent.  Neuro:  Unresponsive HEENT:  Dry mucus membranes Cardiovascular:  Irreg irreg, systolic murmur Lungs:   Clear, no wheeze, crackles/ Abdomen:  Soft, nontender, + BS Skin:  Bruises  LABS:  BMET  Recent Labs Lab 02/03/2016 0445 02/04/2016 1544 08-Mar-2016 0508  NA 138 129* 135  K 4.5 4.0 5.1  CL 104 98* 105  CO2 16* 16* 13*  BUN 57* 61* 67*  CREATININE 2.05* 2.64* 2.90*  GLUCOSE 125* 121* 70    Electrolytes  Recent Labs Lab 02/02/2016 0445 02/20/2016 1544 08-Mar-2016 0508  CALCIUM 9.0 7.9* 8.1*  MG  --  1.6* 1.7  PHOS  --   --  4.9*    CBC  Recent Labs Lab 02/03/2016 0445 03-08-2016 0508  WBC 22.8* 35.8*  HGB 11.4* 10.8*  HCT 37.1* 35.9*  PLT 225 125*    Coag's  Recent Labs Lab 02/03/2016 0445 03/08/16 0830  INR 4.65* 7.81*    Sepsis Markers  Recent Labs Lab 02/03/2016 0733 02/15/2016 1202 03/08/2016 1237  LATICACIDVEN 3.55* 3.2* 5.4*    ABG  Recent Labs Lab 03-08-16 0630  PHART 7.286*  PCO2ART 27.3*  PO2ART 92.9    Liver Enzymes  Recent Labs Lab 02/17/2016 0445  AST 32  ALT 18  ALKPHOS 123  BILITOT 1.3*  ALBUMIN 3.5    Cardiac Enzymes No results for input(s): TROPONINI, PROBNP in the last 168 hours.  Glucose  Recent Labs Lab 02/12/2016 2332 February 27, 2016 0402 2016-02-27 0754 Feb 27, 2016 0909 February 27, 2016 1154 February 27, 2016 1325  GLUCAP 106* 77 56* 84 47* 99    STUDIES:    CULTURES: 2/6 blood > 2/6 urine >   ANTIBIOTICS: 2/6 Vanc > 2/6 2/6 Zosyn > 2/6 2/6 Aztreonam > 2/6 2/6 Azithromycin > 2/6  SIGNIFICANT EVENTS:   LINES/TUBES: 2/6 R IJ CVL >   DISCUSSION: 82 y/o male with multiple chronic medical issues came to the ER on 2/6 with septic shock presumably due to a UTI but also with aspiration vs community acquired pneumonia.   ASSESSMENT / PLAN:  PULMONARY A: Acute respiratory failure with hypoxemia > high risk for mechanical ventilation Aspiration vs community acquired pneumonia Consider Acute Pulmonary Edema Obstructive sleep apnea with chronic hypercapnea on BIPAP P:   Monitor in ICU BIPAP qHS O2 as needed to maintain O2 >  90% Limit further IVF  CARDIOVASCULAR A:  Septic shock > now on levophed, lactic acid improving Afib Chronic CHF Secondary pulmonary hypertension P:  Levophed for MAP > 52 Hold home hypertensive meds Repeat lactic acid  RENAL A:   CKD Anion Gap acidosis due to lactic acidosis P:   Monitor BMET and UOP Replace electrolytes as needed Repeat BMET later today  GASTROINTESTINAL A:   No acute issues P:   NPO  HEMATOLOGIC A:   Myelodysplasia P:  Notify Oncology of admission  INFECTIOUS A:   UTI Community acquired vs aspiration pneumonia P:   abx Aztreonam and Azithromycin Monitor cultures  ENDOCRINE A:   DM2 Hypothyroidism P:   Continue synthroid SSI  NEUROLOGIC A:   No acute issues P:   Monitor neurologic status   FAMILY  - Updates:  Mr. Deraad is continued to deteriorate over the course of the day. I had a lengthy conversations with his wife who was clearly struggling to accept his condition. She was initially undecided and the goals of care. I have informed her that with his multiorgan failure, advanced age, severe pulmonary HTN, severe septic shock the chances of recovery are extremely poor especially if we intubate him.  In the afternoon his pressure dropped suddenly, became obtunded and stopped having spontaneous beats [only paced beats]. His wife decided to stop all medical measures, we will go ahead with withdrawal of care, keep him comfortable.  My Critical care time- 45 mins.  Marshell Garfinkel MD [place] [facility] Pager [phone] If no answer or after 3pm call: [phone] 02-27-2016, 4:20 PM

## 2016-02-29 NOTE — Progress Notes (Signed)
ANTICOAGULATION CONSULT NOTE - Initial Consult  Pharmacy Consult for Coumadin Indication: atrial fibrillation and history of DVT (2009)  Allergies  Allergen Reactions  . Keflex [Cephalexin] Other (See Comments)    Paranoid ..  . Codeine Nausea Only  . Ramipril Cough  . Vancomycin Nausea Only    Patient Measurements: Height: 5\' 3"  (160 cm) Weight: 153 lb (69.4 kg) IBW/kg (Calculated) : 56.9  Vital Signs: Temp: 101.7 F (38.7 C) (02/07 1200) Temp Source: Core (Comment) (02/07 0800) BP: 118/55 mmHg (02/07 1200) Pulse Rate: 81 (02/07 1200)  Labs:  Recent Labs  02/20/2016 0445 02/11/2016 1544 02-18-16 0508 Feb 18, 2016 0830  HGB 11.4*  --  10.8*  --   HCT 37.1*  --  35.9*  --   PLT 225  --  125*  --   LABPROT 42.6*  --   --  62.8*  INR 4.65*  --   --  7.81*  CREATININE 2.05* 2.64* 2.90*  --     Estimated Creatinine Clearance: 16 mL/min (by C-G formula based on Cr of 2.9).   Medical History: Past Medical History  Diagnosis Date  . Hearing loss   . Skin change   . Chronic atrial fibrillation ([place])   . Diabetes mellitus type II, controlled ([place])   . Hyperlipidemia   . Depression   . Ischemic heart disease     remote stenting of the RCA in 1998 with residual LAD disease of 60 to 70% with negative nuclear in 2011  . Pulmonary hypertension ([place])     per echo in 2011  . Diverticulitis   . H/O blood clots 1990's    in L leg (related to being in a cast for surgery)  . Thrombocytopenia ([place])   . Anal fissure   . Neck mass 10/19/2013  . MDS (myelodysplastic syndrome) ([place]) 12/11/2013  . CHF (congestive heart failure) ([place])   . Myocardial infarction ([place]) 1999  . DVT (deep venous thrombosis) ([place]) ~ 2009    "LLE"  . Pneumonia ~ 11/2013    "tx'd; not really sure if he had it or not"  . OSA (obstructive sleep apnea)     BiPAP  . Hypothyroidism   . Anemia in chronic kidney disease(285.21) 11/13/2013  . GERD (gastroesophageal reflux disease)   . Arthritis     "joints"  (04/21/2014)  . Spinal stenosis, lumbar   . Gout     "little bit in the left foot"  . Complete heart block [facility])     s/p single chamber PPM implantation April 2015 [facility])    Medications:  Prescriptions prior to admission  Medication Sig Dispense Refill Last Dose  . ACCU-CHEK AVIVA PLUS test strip USE ONCE DAILY AS DIRECTED 50 each 5 02/05/2016 at Unknown time  . acetaminophen (TYLENOL) 500 MG tablet Take 1,000 mg by mouth every 8 (eight) hours as needed for moderate pain or headache.    02/17/2016 at Unknown time  . Calcium Carbonate-Vit D-Min (CALCIUM 1200 PO) Take 1,200 mg by mouth daily. Reported on 01/05/2016   02/05/2016 at Unknown time  . Cholecalciferol (VITAMIN D) 1000 UNITS capsule Take 1,000 Units by mouth daily.    02/05/2016 at Unknown time  . clotrimazole-betamethasone (LOTRISONE) cream Apply 1 application topically daily.   02/05/2016 at Unknown time  . docusate sodium (COLACE) 100 MG capsule Take 100 mg by mouth 2 (two) times daily.   02/05/2016 at Unknown time  . Flaxseed, Linseed, (FLAX SEED OIL PO) Take 1 tablet by mouth  daily.    02/05/2016 at Unknown time  . fluorouracil (EFUDEX) 5 % cream Apply 1 application topically daily as needed (rash).   2 unk  . furosemide (LASIX) 80 MG tablet TAKE 1 TABLET BY MOUTH EVERY DAY (Patient taking differently: TAKE 0.5 TABLET BY MOUTH EVERY DAY) 30 tablet 3 02/05/2016 at Unknown time  . levothyroxine (SYNTHROID, LEVOTHROID) 150 MCG tablet TAKE 1 TABLET BY MOUTH EVERY MORNING ON AN EMPTY STOMACH 30 tablet 5 02/05/2016 at Unknown time  . losartan (COZAAR) 50 MG tablet TAKE 1 TABLET(50 MG) BY MOUTH DAILY 30 tablet 0 02/05/2016 at Unknown time  . Misc Natural Products (OSTEO BI-FLEX JOINT SHIELD PO) Take 1 tablet by mouth 2 (two) times daily.    02/05/2016 at Unknown time  . oxybutynin (DITROPAN) 5 MG tablet Take 5 mg by mouth 2 (two) times daily.    02/05/2016 at Unknown time  . polyethylene glycol (MIRALAX / GLYCOLAX) packet Take 17 g by mouth daily as  needed for mild constipation.    unk  . Pyridoxine HCl (VITAMIN B-6) 250 MG tablet Take 250 mg by mouth daily.   02/05/2016 at Unknown time  . tobramycin (TOBREX) 0.3 % ophthalmic solution Place 1 drop into both eyes 2 (two) times daily as needed (stinging).    couple months  . vitamin B-12 (CYANOCOBALAMIN) 1000 MCG tablet Take 1,000 mcg by mouth daily.   02/05/2016 at Unknown time  . vitamin C (ASCORBIC ACID) 500 MG tablet Take 500 mg by mouth at bedtime.    02/05/2016 at Unknown time  . warfarin (COUMADIN) 2.5 MG tablet TAKE AS DIRECTED BY ANTICOAGULATION CLINIC (Patient taking differently: Take one tablet every evening.) 35 tablet 0 02/05/2016 at Unknown time    Assessment: 82 yo m admitted with dysuria and dyspnea x 24 hours.  Patient is on Coumadin PTA for afib and h/o DVT (2009).  PTA dose is 2.5 mg daily. INR on admission was elevated at 4.65.  INR today is rising at 7.81 - no coumadin has been given.  Hgb 10.8, plts 125 - watch.   Goal of Therapy:  INR 2-3 Monitor platelets by anticoagulation protocol: Yes   Plan:  Hold Coumadin again tonight F/u INR in AM to determine if any doses needed Daily INR/CBC Monitor for signs and symptoms of bleeding  Taya Ashbaugh L. Nicole Kindred, PharmD PGY2 Infectious Diseases Pharmacy Resident Pager: [phone] 02-29-2016 12:59 PM

## 2016-02-29 NOTE — Clinical Documentation Improvement (Signed)
Critical Care  Can the diagnosis of CKD be further specified in progress notes and discharge?   CKD Stage I - GFR greater than or equal to 90  CKD Stage II - GFR 60-89  CKD Stage III - GFR 30-59  CKD Stage IV - GFR 15-29  CKD Stage V - GFR < 15  ESRD (End Stage Renal Disease)  Other condition  Unable to clinically determine   Supporting Information:   82 year old white male  History of Anemia in chronic kidney disease 2/6 progress notes: RENAL A:  CKD Anion Gap acidosis due to lactic acidosis P:  Monitor BMET and UOP Replace electrolytes as needed Repeat BMET later today Component     Latest Ref Rng 01/03/2016 02/13/2016 02/21/2016 2016-02-26          4:45 AM  3:44 PM   BUN     6 - 20 mg/dL 77 (H) 57 (H) 61 (H) 67 (H)  Creatinine     0.61 - 1.24 mg/dL 2.01 (H) 2.05 (H) 2.64 (H) 2.90 (H)        EGFR (Non-African Amer.)     >60 mL/min  28 (L) 20 (L) 18 (L)         Please exercise your independent, professional judgment when responding. A specific answer is not anticipated or expected.   Thank You, [place] [phone]

## 2016-02-29 DEATH — deceased

## 2016-03-14 ENCOUNTER — Telehealth: Payer: Self-pay | Admitting: Pulmonary Disease

## 2016-03-14 NOTE — Telephone Encounter (Signed)
Virl Axe called stating that she needs Dr. Vaughan Browner to answer coding query in his in basket in [facility] by this Friday regarding this patient. Please advise Dr. Vaughan Browner thanks

## 2016-03-16 NOTE — Telephone Encounter (Signed)
Dr. Mannam please advise. °

## 2016-03-21 ENCOUNTER — Other Ambulatory Visit: Payer: Medicare Other

## 2016-03-21 ENCOUNTER — Ambulatory Visit: Payer: Medicare Other | Admitting: Hematology and Oncology

## 2016-03-21 ENCOUNTER — Ambulatory Visit: Payer: Medicare Other

## 2016-03-21 NOTE — Telephone Encounter (Signed)
Spoke with Douglas George in [facility] to advise her that PM has been on vacation. She stated that these particular codings have dropped off as the deadline for completing is the 19th of every month. Per Douglas George PM does have several more in his box that need completing by 04/18/16. Routing message to PM for review once he returns from vacation.

## 2016-11-20 ENCOUNTER — Telehealth: Payer: Self-pay | Admitting: Emergency Medicine
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
## 2016-11-21 NOTE — Telephone Encounter (Signed)
I would be happy to try to assist with this. I will need the details of the problem, then will need to include Dr Vaughan Browner who would need to adjust the certificate if we believe this is appropriate. I will cc Dr Vaughan Browner on this so he knows the background.

## 2016-11-23 NOTE — Telephone Encounter (Signed)
Left a vm for Douglas George at [facility] to call back with additional information for RB and PM in regards to this.  Did reach out to pt.'s wife and just updated her that once we get all the information we will contact her back. Nothing further is  Needed at this time.

## 2016-11-26 NOTE — Telephone Encounter (Signed)
Lmtcb for Douglas George at [phone].

## 2016-11-26 NOTE — Telephone Encounter (Signed)
Wife calling stating that she has spoken w/lady @ [place] and she has more info to give to you and she can be reached @ [phone].Hillery Hunter
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
## 2016-11-27 NOTE — Telephone Encounter (Signed)
[place] records

## 2016-11-30 NOTE — Telephone Encounter (Signed)
Information brought to office by pt's spouse with a 2-page letter explaining her request This has been placed in RB's lookat

## 2016-12-03 NOTE — Telephone Encounter (Signed)
Douglas George ([place] [facility] [phone]) returned phone call.[name]
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
## 2016-12-06 NOTE — Telephone Encounter (Addendum)
Called and spoke with [facility] and she stated that the form that needs to be completed and signed by PM in ink was printed out and given to [place].    Cloyde Reams stated that once this is completed and has been signed---if we will fax this to [place] at [phone] and then call her and let her know that we have faxed this, she will let us know if this is completed correctly, and then they will be able to submit this.   Will forward to Jess to follow up on. Jess please let the pts wife know once this has been done.  thanks

## 2016-12-06 NOTE — Telephone Encounter (Signed)
[place] calling about her husbands death certificate
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
## 2016-12-14 NOTE — Telephone Encounter (Signed)
Spoke with pt's wife today to get info regarding the appropriate forms to send. She is unsure whether the Request to Amend a Record needs to accompany the Supplemental Report of Cause of Death form. She hasn't sent anything to them yet, no certified check, etc. I suspect that both need to go to the [facility]. We will inquire with the VA to insure that we are doing this correctly.

## 2016-12-17 NOTE — Telephone Encounter (Signed)
Spoke with Seth Bake at the [place]. The Request to Amend a Record form needs to be filled out and submitted first. RB has filled this form out the best of his ability. I called and spoke with the pt's wife. She is aware that this form is ready to be picked up. She was very appreciative and will pick this up.  RB has already completed all of the other pertinent forms to go along with this process. They have been placed in "Douglas George's To Do" folder in RB's look at cubbie.

## 2016-12-20 NOTE — Telephone Encounter (Signed)
Spoke with pt's wife. She is aware that all of the forms that she needed to complete this process were placed up front for her to pick up. Nothing further was needed at this time.

## 2016-12-20 NOTE — Telephone Encounter (Signed)
Called pt wife Douglas George to check and make sure she received message to come pick up forms completed by Dr Lamonte Sakai.  LM x 1 - forms are in [place] folder in RB's cubby

## 2016-12-20 NOTE — Telephone Encounter (Signed)
Pt wife stated she picked the forms up on Tuesday. She was wondering if there was anymore forms to pick up.

## 2017-01-22 IMAGING — CT CT HEAD W/O CM
2 series · 16 of 30 positions shown, 19 images · non-contrast
Comparison: CT head without contrast 07/05/2012.

CLINICAL DATA: Bleeding from the left ear.  Altered mental status.

EXAM:
CT HEAD WITHOUT CONTRAST
TECHNIQUE: Contiguous axial images were obtained from the base of the skull
through the vertex without intravenous contrast.

[Series 2: head_seq 4.5 h37s st · axial · 0.43mm/px · z∈[-139,-13]mm · 10 of 36 slices shown, 13 images]
[im 4/36  brain]
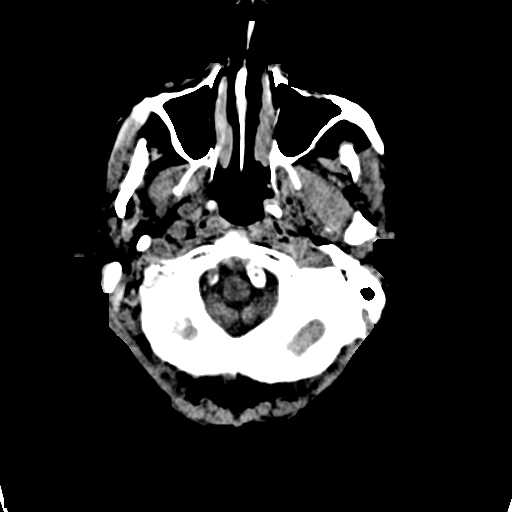
[im 4/36  bone]
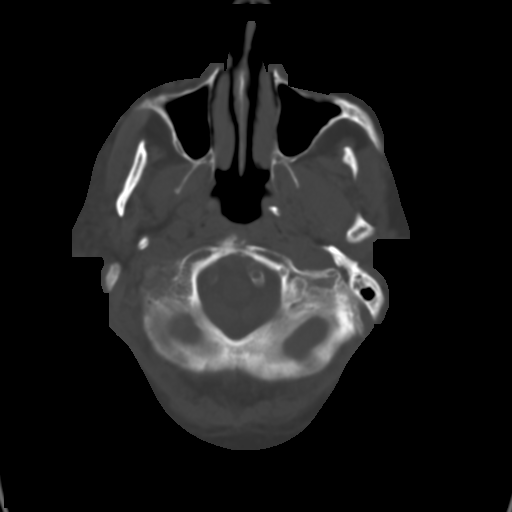
[im 7/36  brain]
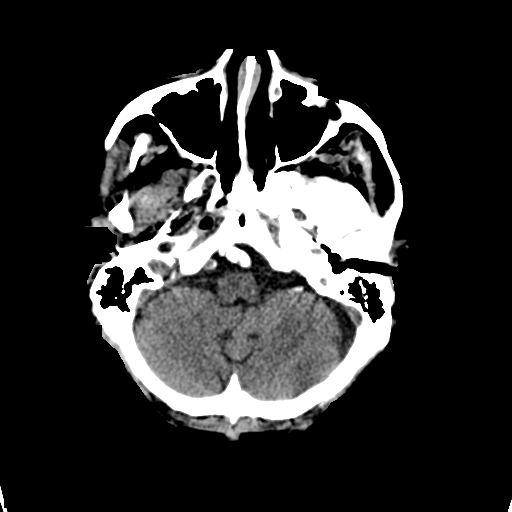
[im 10/36  brain]
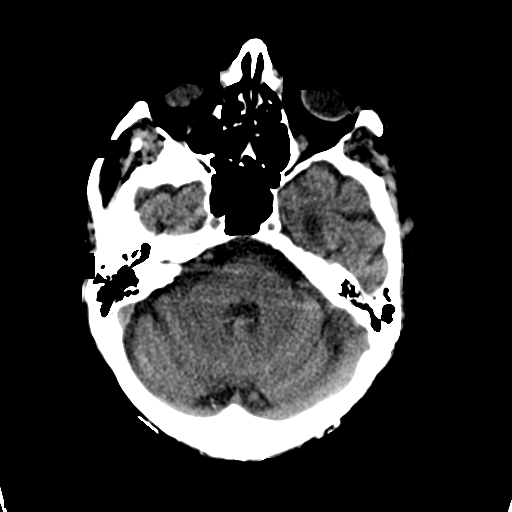
[im 13/36  brain]
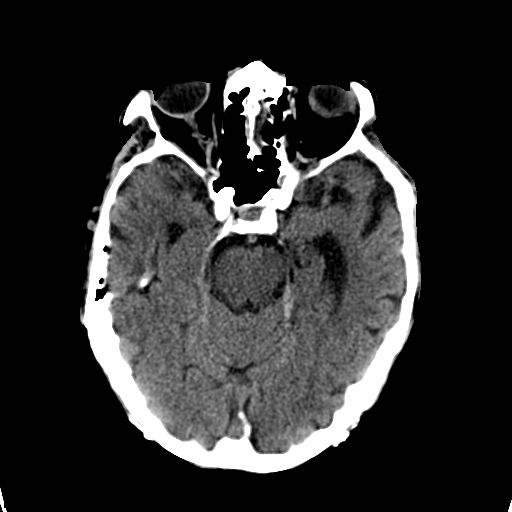
[im 16/36  brain]
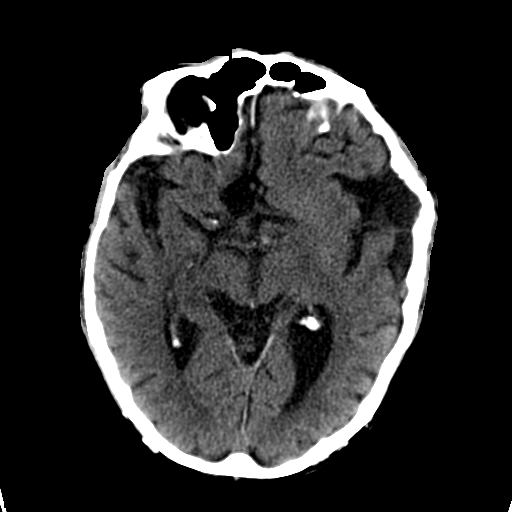
[im 16/36  bone]
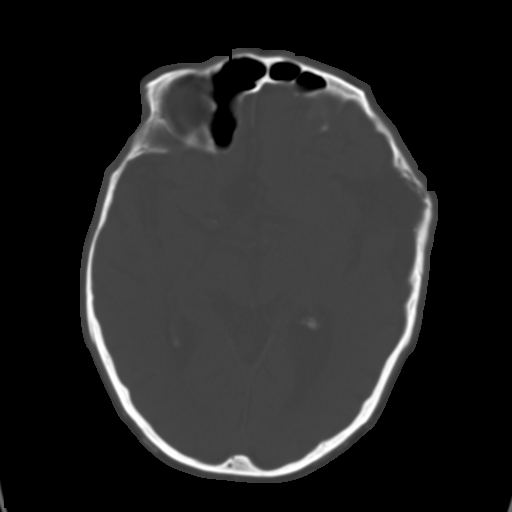
[im 20/36  brain]
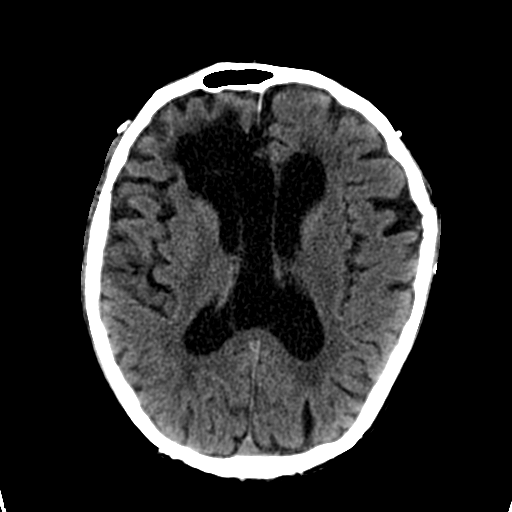
[im 23/36  brain]
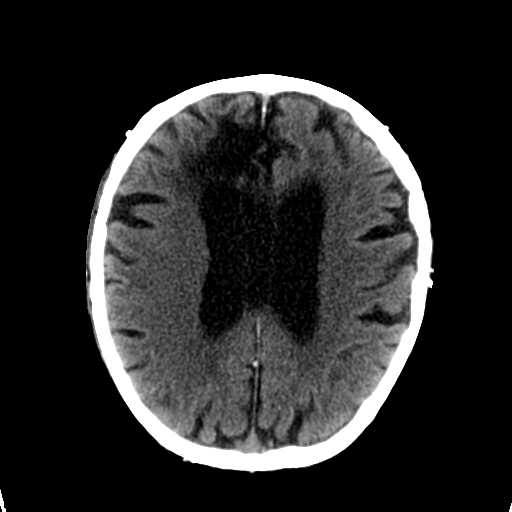
[im 26/36  brain]
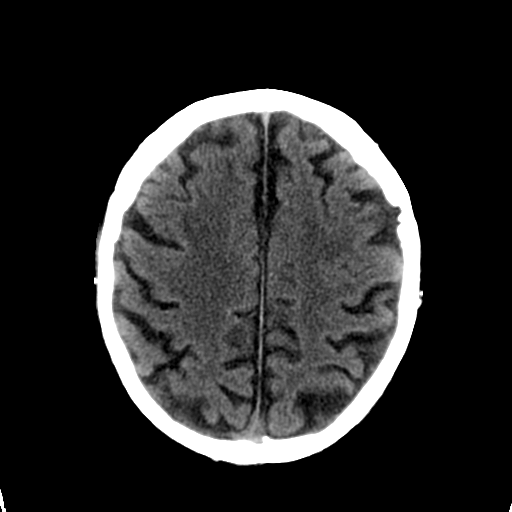
[im 29/36  brain]
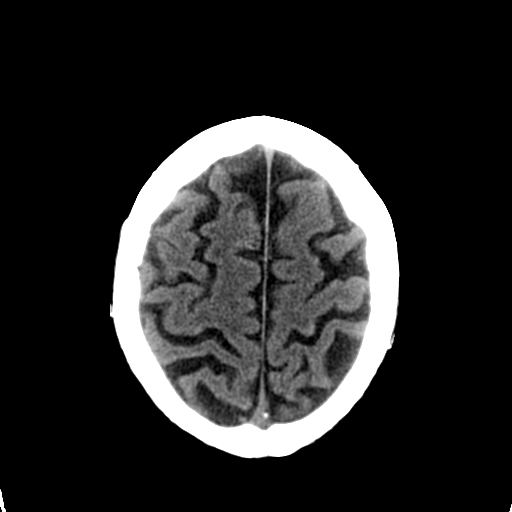
[im 29/36  bone]
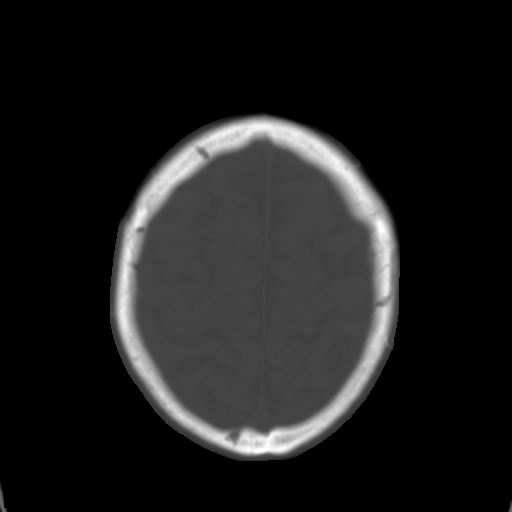
[im 32/36  brain]
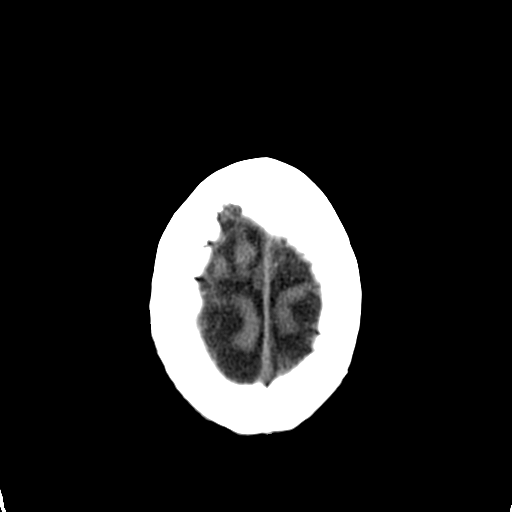

[Series 3: head_seq 3.0 h60s bone · axial · 0.43mm/px · z∈[-135,-42]mm · 6 of 54 slices shown]
[im 7/54  bone]
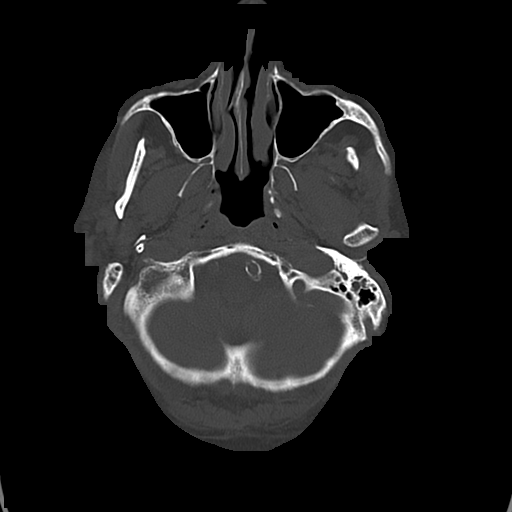
[im 13/54  bone]
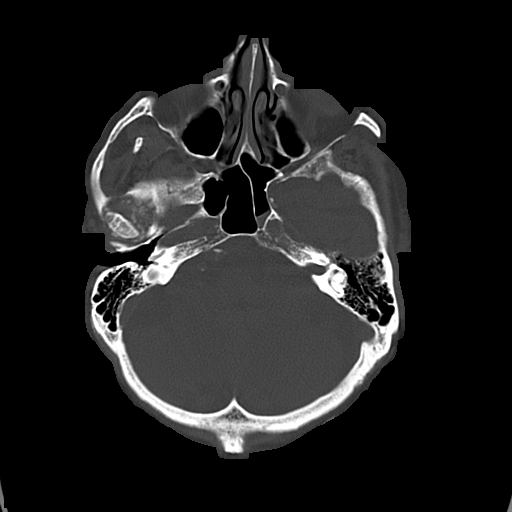
[im 19/54  bone]
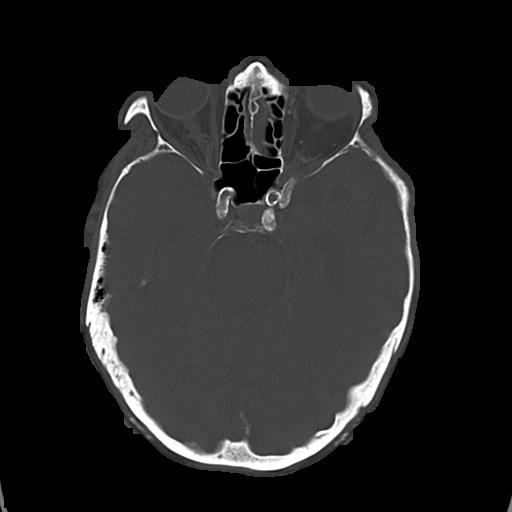
[im 25/54  bone]
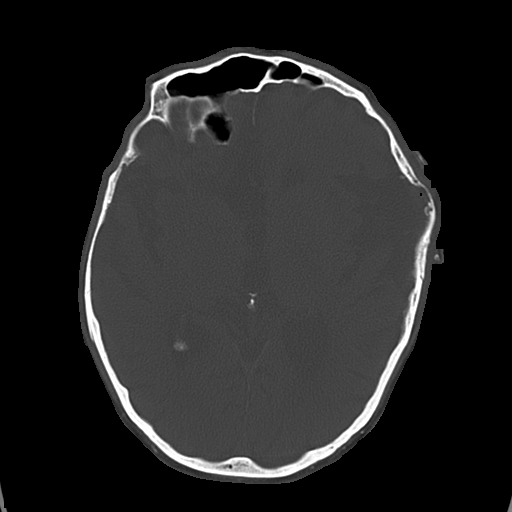
[im 32/54  bone]
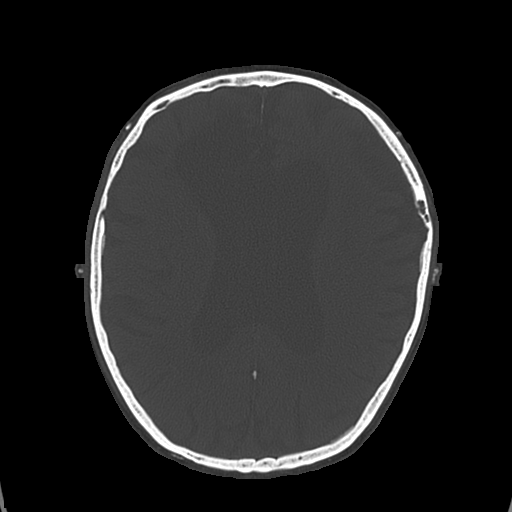
[im 38/54  bone]
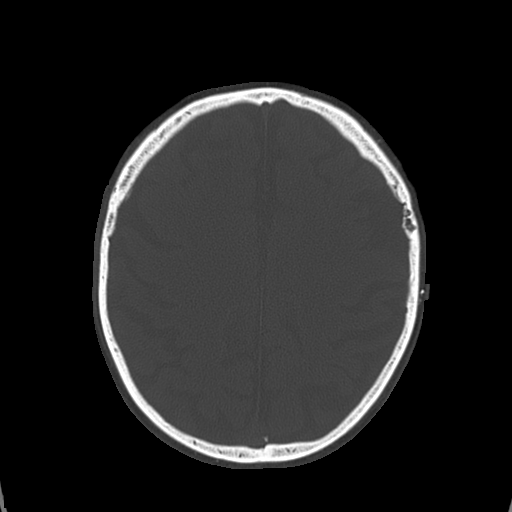

[16 of 30 positions shown; findings below may reference images not displayed]

FINDINGS: Fluid or soft tissue is noted along the left external auditory canal
up to the level of the tympanic membrane. The left middle ear cavity
mastoid air cells are clear. No other soft tissue mass is present.
Evaluation is somewhat limited on head CT. CT the temporal bone
could be used for further evaluation if clinically indicated.

Moderate generalized atrophy and white matter disease is present.
Remote encephalomalacia of the anterior right frontal lobe is
present. There is a focal deformity involving the squamosal portion
of the left temporal bone in the left parietal bone, potentially
related to remote trauma. No acute fracture is present.

The ventricles are proportionate to the degree of atrophy. A
persistent cavum septum pellucidum is noted.

Dense calcifications are present within the cavernous internal
carotid arteries bilaterally as well as at the dural margin of the
vertebral arteries to the level of the vertebrobasilar junction.
IMPRESSION: 1. Fluid or soft tissue within the left external auditory canal up
to the level of the tympanic membrane. This area should be amenable
to direct visualization.
2. No significant middle ear or mastoid effusion. No definite mass
lesion.
3. Stable encephalomalacia of the anterior right frontal lobe.
4. Focal osseous deformity involving the squamous portion of the
left temporal bone and left parietal bone.
5. Stable atrophy and white matter disease. No acute intracranial
abnormality.
6. Atherosclerosis.
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
